# Patient Record
Sex: Female | Born: 1950 | Race: White | Hispanic: No | Marital: Married | Smoking: Never smoker
Health system: Southern US, Community
[De-identification: ages and names within clinical notes are randomized; demographics above are authoritative.]

## PROBLEM LIST (undated history)

## (undated) DIAGNOSIS — R519 Headache, unspecified: Secondary | ICD-10-CM

## (undated) DIAGNOSIS — Z87898 Personal history of other specified conditions: Secondary | ICD-10-CM

## (undated) DIAGNOSIS — H0016 Chalazion left eye, unspecified eyelid: Secondary | ICD-10-CM

## (undated) DIAGNOSIS — N390 Urinary tract infection, site not specified: Secondary | ICD-10-CM

## (undated) DIAGNOSIS — E041 Nontoxic single thyroid nodule: Secondary | ICD-10-CM

## (undated) DIAGNOSIS — K219 Gastro-esophageal reflux disease without esophagitis: Secondary | ICD-10-CM

## (undated) DIAGNOSIS — R002 Palpitations: Secondary | ICD-10-CM

## (undated) DIAGNOSIS — J449 Chronic obstructive pulmonary disease, unspecified: Secondary | ICD-10-CM

## (undated) DIAGNOSIS — D869 Sarcoidosis, unspecified: Secondary | ICD-10-CM

## (undated) DIAGNOSIS — I10 Essential (primary) hypertension: Secondary | ICD-10-CM

## (undated) DIAGNOSIS — R531 Weakness: Secondary | ICD-10-CM

## (undated) DIAGNOSIS — IMO0001 Reserved for inherently not codable concepts without codable children: Secondary | ICD-10-CM

## (undated) DIAGNOSIS — K5790 Diverticulosis of intestine, part unspecified, without perforation or abscess without bleeding: Secondary | ICD-10-CM

## (undated) DIAGNOSIS — J45909 Unspecified asthma, uncomplicated: Secondary | ICD-10-CM

## (undated) DIAGNOSIS — A630 Anogenital (venereal) warts: Secondary | ICD-10-CM

## (undated) DIAGNOSIS — J069 Acute upper respiratory infection, unspecified: Secondary | ICD-10-CM

## (undated) DIAGNOSIS — G8929 Other chronic pain: Secondary | ICD-10-CM

## (undated) DIAGNOSIS — J189 Pneumonia, unspecified organism: Secondary | ICD-10-CM

## (undated) DIAGNOSIS — N393 Stress incontinence (female) (male): Secondary | ICD-10-CM

## (undated) DIAGNOSIS — G4730 Sleep apnea, unspecified: Secondary | ICD-10-CM

## (undated) DIAGNOSIS — Z8719 Personal history of other diseases of the digestive system: Secondary | ICD-10-CM

## (undated) DIAGNOSIS — Z860100 Personal history of colon polyps, unspecified: Secondary | ICD-10-CM

## (undated) DIAGNOSIS — R87619 Unspecified abnormal cytological findings in specimens from cervix uteri: Secondary | ICD-10-CM

## (undated) DIAGNOSIS — M2550 Pain in unspecified joint: Secondary | ICD-10-CM

## (undated) DIAGNOSIS — R51 Headache: Secondary | ICD-10-CM

## (undated) DIAGNOSIS — R7989 Other specified abnormal findings of blood chemistry: Secondary | ICD-10-CM

## (undated) DIAGNOSIS — C801 Malignant (primary) neoplasm, unspecified: Secondary | ICD-10-CM

## (undated) DIAGNOSIS — Z8709 Personal history of other diseases of the respiratory system: Secondary | ICD-10-CM

## (undated) DIAGNOSIS — K589 Irritable bowel syndrome without diarrhea: Secondary | ICD-10-CM

## (undated) DIAGNOSIS — F419 Anxiety disorder, unspecified: Secondary | ICD-10-CM

## (undated) DIAGNOSIS — J302 Other seasonal allergic rhinitis: Secondary | ICD-10-CM

## (undated) DIAGNOSIS — M21379 Foot drop, unspecified foot: Secondary | ICD-10-CM

## (undated) DIAGNOSIS — R112 Nausea with vomiting, unspecified: Secondary | ICD-10-CM

## (undated) DIAGNOSIS — E559 Vitamin D deficiency, unspecified: Secondary | ICD-10-CM

## (undated) DIAGNOSIS — R3915 Urgency of urination: Secondary | ICD-10-CM

## (undated) DIAGNOSIS — Z9889 Other specified postprocedural states: Secondary | ICD-10-CM

## (undated) DIAGNOSIS — M1990 Unspecified osteoarthritis, unspecified site: Secondary | ICD-10-CM

## (undated) DIAGNOSIS — M549 Dorsalgia, unspecified: Secondary | ICD-10-CM

## (undated) DIAGNOSIS — K5900 Constipation, unspecified: Secondary | ICD-10-CM

## (undated) HISTORY — PX: COLONOSCOPY: SHX174

## (undated) HISTORY — DX: Unspecified abnormal cytological findings in specimens from cervix uteri: R87.619

## (undated) HISTORY — PX: EXPLORATORY LAPAROTOMY: SUR591

## (undated) HISTORY — PX: EYE SURGERY: SHX253

## (undated) HISTORY — DX: Anogenital (venereal) warts: A63.0

## (undated) HISTORY — PX: ABDOMINAL HYSTERECTOMY: SHX81

## (undated) HISTORY — PX: ESOPHAGOGASTRODUODENOSCOPY: SHX1529

## (undated) HISTORY — PX: CERVICAL FUSION: SHX112

## (undated) HISTORY — PX: CARPAL TUNNEL RELEASE: SHX101

## (undated) HISTORY — DX: Gastro-esophageal reflux disease without esophagitis: K21.9

## (undated) HISTORY — PX: OTHER SURGICAL HISTORY: SHX169

## (undated) HISTORY — PX: BUNIONECTOMY: SHX129

## (undated) HISTORY — PX: TONSILLECTOMY: SUR1361

## (undated) HISTORY — PX: BACK SURGERY: SHX140

---

## 1994-09-05 HISTORY — PX: BREAST BIOPSY: SHX20

## 2005-03-21 ENCOUNTER — Ambulatory Visit: Payer: Self-pay | Admitting: Internal Medicine

## 2005-08-31 ENCOUNTER — Other Ambulatory Visit: Payer: Self-pay

## 2005-09-02 ENCOUNTER — Ambulatory Visit: Payer: Self-pay | Admitting: Podiatry

## 2006-01-13 ENCOUNTER — Ambulatory Visit: Payer: Self-pay | Admitting: Unknown Physician Specialty

## 2006-03-23 ENCOUNTER — Ambulatory Visit: Payer: Self-pay | Admitting: Internal Medicine

## 2006-03-27 ENCOUNTER — Ambulatory Visit: Payer: Self-pay | Admitting: Unknown Physician Specialty

## 2006-07-26 ENCOUNTER — Ambulatory Visit: Payer: Self-pay | Admitting: Internal Medicine

## 2006-09-01 ENCOUNTER — Ambulatory Visit: Payer: Self-pay | Admitting: Podiatry

## 2006-09-08 ENCOUNTER — Ambulatory Visit: Payer: Self-pay | Admitting: Podiatry

## 2007-02-09 ENCOUNTER — Ambulatory Visit: Payer: Self-pay | Admitting: Internal Medicine

## 2007-03-16 ENCOUNTER — Ambulatory Visit: Payer: Self-pay | Admitting: Internal Medicine

## 2007-03-26 ENCOUNTER — Ambulatory Visit: Payer: Self-pay | Admitting: Internal Medicine

## 2007-04-02 ENCOUNTER — Ambulatory Visit: Payer: Self-pay | Admitting: Urology

## 2007-10-11 ENCOUNTER — Ambulatory Visit: Payer: Self-pay | Admitting: Internal Medicine

## 2008-03-27 ENCOUNTER — Ambulatory Visit: Payer: Self-pay | Admitting: Internal Medicine

## 2009-03-17 ENCOUNTER — Ambulatory Visit: Payer: Self-pay | Admitting: Unknown Physician Specialty

## 2009-03-27 ENCOUNTER — Ambulatory Visit: Payer: Self-pay | Admitting: Unknown Physician Specialty

## 2009-03-30 ENCOUNTER — Ambulatory Visit: Payer: Self-pay | Admitting: Internal Medicine

## 2009-08-19 ENCOUNTER — Ambulatory Visit: Payer: Self-pay | Admitting: Internal Medicine

## 2010-03-31 ENCOUNTER — Ambulatory Visit: Payer: Self-pay | Admitting: Internal Medicine

## 2010-08-20 ENCOUNTER — Encounter
Admission: RE | Admit: 2010-08-20 | Discharge: 2010-08-20 | Payer: Self-pay | Source: Home / Self Care | Attending: Orthopedic Surgery | Admitting: Orthopedic Surgery

## 2011-04-27 ENCOUNTER — Ambulatory Visit: Payer: Self-pay | Admitting: Internal Medicine

## 2011-09-02 ENCOUNTER — Ambulatory Visit: Payer: Self-pay | Admitting: Unknown Physician Specialty

## 2012-05-01 ENCOUNTER — Ambulatory Visit: Payer: Self-pay | Admitting: Internal Medicine

## 2012-11-15 ENCOUNTER — Ambulatory Visit: Payer: Self-pay | Admitting: Orthopedic Surgery

## 2013-02-15 DIAGNOSIS — J45909 Unspecified asthma, uncomplicated: Secondary | ICD-10-CM | POA: Insufficient documentation

## 2013-05-02 ENCOUNTER — Ambulatory Visit: Payer: Self-pay | Admitting: Internal Medicine

## 2014-03-10 ENCOUNTER — Ambulatory Visit: Payer: Self-pay | Admitting: Orthopedic Surgery

## 2014-03-10 LAB — CBC WITH DIFFERENTIAL/PLATELET
BASOS PCT: 0.8 %
Basophil #: 0.1 10*3/uL (ref 0.0–0.1)
Eosinophil #: 0.4 10*3/uL (ref 0.0–0.7)
Eosinophil %: 5.7 %
HCT: 40.1 % (ref 35.0–47.0)
HGB: 13.7 g/dL (ref 12.0–16.0)
LYMPHS ABS: 1.6 10*3/uL (ref 1.0–3.6)
Lymphocyte %: 22.2 %
MCH: 31.1 pg (ref 26.0–34.0)
MCHC: 34.3 g/dL (ref 32.0–36.0)
MCV: 91 fL (ref 80–100)
Monocyte #: 0.5 x10 3/mm (ref 0.2–0.9)
Monocyte %: 6.4 %
Neutrophil #: 4.7 10*3/uL (ref 1.4–6.5)
Neutrophil %: 64.9 %
Platelet: 204 10*3/uL (ref 150–440)
RBC: 4.42 10*6/uL (ref 3.80–5.20)
RDW: 13.2 % (ref 11.5–14.5)
WBC: 7.3 10*3/uL (ref 3.6–11.0)

## 2014-03-10 LAB — POTASSIUM: POTASSIUM: 3.9 mmol/L (ref 3.5–5.1)

## 2014-03-13 DIAGNOSIS — I10 Essential (primary) hypertension: Secondary | ICD-10-CM | POA: Insufficient documentation

## 2014-03-13 DIAGNOSIS — E785 Hyperlipidemia, unspecified: Secondary | ICD-10-CM | POA: Insufficient documentation

## 2014-03-13 DIAGNOSIS — R002 Palpitations: Secondary | ICD-10-CM | POA: Insufficient documentation

## 2014-03-13 DIAGNOSIS — D869 Sarcoidosis, unspecified: Secondary | ICD-10-CM | POA: Insufficient documentation

## 2014-03-18 ENCOUNTER — Ambulatory Visit: Payer: Self-pay | Admitting: Orthopedic Surgery

## 2014-06-09 ENCOUNTER — Ambulatory Visit: Payer: Self-pay | Admitting: Internal Medicine

## 2014-06-19 ENCOUNTER — Ambulatory Visit: Payer: Self-pay | Admitting: Internal Medicine

## 2014-09-02 ENCOUNTER — Ambulatory Visit: Payer: Self-pay | Admitting: Internal Medicine

## 2014-10-08 ENCOUNTER — Other Ambulatory Visit (HOSPITAL_COMMUNITY): Payer: Self-pay | Admitting: Neurosurgery

## 2014-10-17 ENCOUNTER — Encounter (HOSPITAL_COMMUNITY): Payer: Self-pay

## 2014-10-17 ENCOUNTER — Encounter (HOSPITAL_COMMUNITY)
Admission: RE | Admit: 2014-10-17 | Discharge: 2014-10-17 | Disposition: A | Discharge status: Home / Self Care | Payer: Medicare Other | Source: Ambulatory Visit | Attending: Neurosurgery | Admitting: Neurosurgery

## 2014-10-17 DIAGNOSIS — J45909 Unspecified asthma, uncomplicated: Secondary | ICD-10-CM | POA: Diagnosis not present

## 2014-10-17 DIAGNOSIS — D869 Sarcoidosis, unspecified: Secondary | ICD-10-CM | POA: Diagnosis not present

## 2014-10-17 DIAGNOSIS — I10 Essential (primary) hypertension: Secondary | ICD-10-CM | POA: Diagnosis not present

## 2014-10-17 DIAGNOSIS — M4310 Spondylolisthesis, site unspecified: Secondary | ICD-10-CM | POA: Insufficient documentation

## 2014-10-17 DIAGNOSIS — M4806 Spinal stenosis, lumbar region: Secondary | ICD-10-CM | POA: Diagnosis not present

## 2014-10-17 DIAGNOSIS — K219 Gastro-esophageal reflux disease without esophagitis: Secondary | ICD-10-CM | POA: Insufficient documentation

## 2014-10-17 DIAGNOSIS — Z01812 Encounter for preprocedural laboratory examination: Secondary | ICD-10-CM | POA: Insufficient documentation

## 2014-10-17 HISTORY — DX: Pneumonia, unspecified organism: J18.9

## 2014-10-17 HISTORY — DX: Diverticulosis of intestine, part unspecified, without perforation or abscess without bleeding: K57.90

## 2014-10-17 HISTORY — DX: Personal history of other diseases of the respiratory system: Z87.09

## 2014-10-17 HISTORY — DX: Urinary tract infection, site not specified: N39.0

## 2014-10-17 HISTORY — DX: Personal history of colon polyps, unspecified: Z86.0100

## 2014-10-17 HISTORY — DX: Other seasonal allergic rhinitis: J30.2

## 2014-10-17 HISTORY — DX: Weakness: R53.1

## 2014-10-17 HISTORY — DX: Sarcoidosis, unspecified: D86.9

## 2014-10-17 HISTORY — DX: Acute upper respiratory infection, unspecified: J06.9

## 2014-10-17 HISTORY — DX: Essential (primary) hypertension: I10

## 2014-10-17 HISTORY — DX: Vitamin D deficiency, unspecified: E55.9

## 2014-10-17 HISTORY — DX: Urgency of urination: R39.15

## 2014-10-17 HISTORY — DX: Personal history of colonic polyps: Z86.010

## 2014-10-17 HISTORY — DX: Foot drop, unspecified foot: M21.379

## 2014-10-17 HISTORY — DX: Palpitations: R00.2

## 2014-10-17 HISTORY — DX: Stress incontinence (female) (male): N39.3

## 2014-10-17 HISTORY — DX: Nausea with vomiting, unspecified: R11.2

## 2014-10-17 HISTORY — DX: Unspecified asthma, uncomplicated: J45.909

## 2014-10-17 HISTORY — DX: Constipation, unspecified: K59.00

## 2014-10-17 HISTORY — DX: Other chronic pain: G89.29

## 2014-10-17 HISTORY — DX: Other specified postprocedural states: Z98.890

## 2014-10-17 HISTORY — DX: Unspecified osteoarthritis, unspecified site: M19.90

## 2014-10-17 HISTORY — DX: Personal history of other diseases of the digestive system: Z87.19

## 2014-10-17 HISTORY — DX: Pain in unspecified joint: M25.50

## 2014-10-17 HISTORY — DX: Gastro-esophageal reflux disease without esophagitis: K21.9

## 2014-10-17 HISTORY — DX: Dorsalgia, unspecified: M54.9

## 2014-10-17 LAB — CBC
HCT: 40.2 % (ref 36.0–46.0)
Hemoglobin: 13.4 g/dL (ref 12.0–15.0)
MCH: 30.2 pg (ref 26.0–34.0)
MCHC: 33.3 g/dL (ref 30.0–36.0)
MCV: 90.7 fL (ref 78.0–100.0)
Platelets: 206 10*3/uL (ref 150–400)
RBC: 4.43 MIL/uL (ref 3.87–5.11)
RDW: 13.6 % (ref 11.5–15.5)
WBC: 5.5 10*3/uL (ref 4.0–10.5)

## 2014-10-17 LAB — SURGICAL PCR SCREEN
MRSA, PCR: POSITIVE — AB
Staphylococcus aureus: POSITIVE — AB

## 2014-10-17 LAB — BASIC METABOLIC PANEL
ANION GAP: 11 (ref 5–15)
BUN: 13 mg/dL (ref 6–23)
CHLORIDE: 104 mmol/L (ref 96–112)
CO2: 26 mmol/L (ref 19–32)
Calcium: 9.5 mg/dL (ref 8.4–10.5)
Creatinine, Ser: 1.37 mg/dL — ABNORMAL HIGH (ref 0.50–1.10)
GFR calc Af Amer: 46 mL/min — ABNORMAL LOW (ref >90)
GFR calc non Af Amer: 40 mL/min — ABNORMAL LOW (ref >90)
Glucose, Bld: 96 mg/dL (ref 70–99)
POTASSIUM: 3.9 mmol/L (ref 3.5–5.1)
SODIUM: 141 mmol/L (ref 135–145)

## 2014-10-17 NOTE — Pre-Procedure Instructions (Signed)
Megan Daniels  10/17/2014   Your procedure is scheduled on:  Wed, Feb 24 @ 12:40 PM  Report to [facility]  at 9:40 AM.  Call this number if you have problems the morning of surgery: [phone]   Remember:   Do not eat food or drink liquids after midnight.   Take these medicines the morning of surgery with A SIP OF WATER: Prevacid(Lansoprazole),Claritin(Loratadine),and Metoprolol(Toprol)              Stop taking your Aspirin,Vitamins,or any Herbal Medications a week before surgery. No Goody's,BC's,Aleve,Ibuprofen,or any Fish Oil.    Do not wear jewelry  Do not wear lotions, powders, or colognes. You may wear deodorant.  Do not shave 48 hours prior to surgery.  Do not bring valuables to the hospital.  [facility] is not responsible                  for any belongings or valuables.               Contacts, dentures or bridgework may not be worn into surgery.  Leave suitcase in the car. After surgery it may be brought to your room.  For patients admitted to the hospital, discharge time is determined by your                treatment team.                   Special Instructions:  [facility] - Preparing for Surgery  Before surgery, you can play an important role.  Because skin is not sterile, your skin needs to be as free of germs as possible.  You can reduce the number of germs on you skin by washing with CHG (chlorahexidine gluconate) soap before surgery.  CHG is an antiseptic cleaner which kills germs and bonds with the skin to continue killing germs even after washing.  Please DO NOT use if you have an allergy to CHG or antibacterial soaps.  If your skin becomes reddened/irritated stop using the CHG and inform your nurse when you arrive at Short Stay.  Do not shave (including legs and underarms) for at least 48 hours prior to the first CHG shower.  You may shave your face.  Please follow these instructions carefully:   1.  Shower with CHG Soap the night before surgery  and the                                morning of Surgery.  2.  If you choose to wash your hair, wash your hair first as usual with your       normal shampoo.  3.  After you shampoo, rinse your hair and body thoroughly to remove the                      Shampoo.  4.  Use CHG as you would any other liquid soap.  You can apply chg directly       to the skin and wash gently with scrungie or a clean washcloth.  5.  Apply the CHG Soap to your body ONLY FROM THE NECK DOWN.        Do not use on open wounds or open sores.  Avoid contact with your eyes,       ears, mouth and genitals (private parts).  Wash genitals (private parts)  with your normal soap.  6.  Wash thoroughly, paying special attention to the area where your surgery        will be performed.  7.  Thoroughly rinse your body with warm water from the neck down.  8.  DO NOT shower/wash with your normal soap after using and rinsing off       the CHG Soap.  9.  Pat yourself dry with a clean towel.            10.  Wear clean pajamas.            11.  Place clean sheets on your bed the night of your first shower and do not        sleep with pets.  Day of Surgery  Do not apply any lotions/deoderants the morning of surgery.  Please wear clean clothes to the hospital/surgery center.    Please read over the following fact sheets that you were given: Pain Booklet, Coughing and Deep Breathing, Blood Transfusion Information, MRSA Information and Surgical Site Infection Prevention

## 2014-10-17 NOTE — Progress Notes (Addendum)
Pt doesn't have a cardiologist  Stress test done with [facility] in [place] is [facility]  Echo done in 2016-to request  Denies ever having a heart cath  EKG to be requested from Dr.Sparks  CXR to be requested also

## 2014-10-20 NOTE — Progress Notes (Addendum)
Anesthesia Chart Review:  Patient is a 64 year old female scheduled for L4-5 laminectomy with PLIF on 10/29/14 by Dr. Arnoldo Morale.  History includes non-smoker, palpitations, HTN, Sarcoidosis, asthma, foot drop, GERD, hiatal hernia, Nissan fundoplication, diverticulosis, back and neck surgeries, "EXTREME" POST-OP N/V.  BMI is consistent with obesity.  PCP is Dr. Fulton Reek with [facility] (see Care Everywhere).  PRN pulmonology follow-up recommended by Dr. York Spaniel according to 02/15/13 note in Care Everywhere.   07/02/14 Echo (Care Everywhere): NORMAL LEFT VENTRICULAR SYSTOLIC FUNCTION WITH AN ESTIMATED EF = 55 % NORMAL RIGHT VENTRICULAR SYSTOLIC FUNCTION MILD TRICUSPID AND MITRAL VALVE INSUFFICIENCY NO VALVULAR STENOSIS  According to Dr. Doy Hutching notes from 03/13/14, patient has a history of an EKG with T wave abnormality, but had a "normal" Myoview stress test less than one year ago.  Her last EKG and stress test have been requested but are still pending.  02/15/13 Spirometry (Care Everywhere): Interpretation: Normal. FVC 2.67 (86%). FEV1 2.31 (97%).   Preoperative labs noted.   Additional follow-up once cardiology testing received.  [facility] Short Stay Center/Anesthesiology Phone [phone] 10/20/2014 2:35 PM  Addendum: Reviewed records received from [facility].  - 03/10/14 EKG tracing not received as of yet. (Re-requested)  - 03/21/13 Nuclear stress test [facility]): Normal treadmill ECG without evidence of ischemia or dysrhythmia. Excellent exercise tolerance for age. Normal myocardial perfusion images at rest and peak stress without evidence of ischemia. Normal LV wall motion and LVEF 55%.  - 11/14/13 CXR: Stable enlargement of the cardiopericardial silhouette. Atherosclerotic calcifications noted in the transverse aorta.  Extensive small mediastinal and bi hilar calcified lymph nodes. Reportedly patient has a history of Sarcoidosis. Small nodular opacities  in the RML which are also stable. No new focal airspace opacity, pleural effusion or pneumothorax. No acute osseous abnormality.   The EKG received was from 03/20/13.  If her 03/10/14 EKG from Dr. Doy Hutching office is not received then she will need one repeated on arrival.  She had a normal stress test < 2 years ago and an unremarkable echo within the past six months.  Based on currently available information, I am anticipating that she can proceed if no acute changes.  George Hugh [facility] Short Stay Center/Anesthesiology Phone [phone] 10/21/2014 2:06 PM  Addendum: EKG from 03/13/14 received and showed NSR, possible LAE.  George Hugh [facility] Short Stay Center/Anesthesiology Phone [phone] 10/22/2014 5:27 PM

## 2014-10-22 NOTE — Progress Notes (Signed)
Spoke with Janett Billow at [facility] requested last EKG tracing, stress test, and sleep study.

## 2014-10-28 MED ORDER — CEFAZOLIN SODIUM-DEXTROSE 2-3 GM-% IV SOLR
2.0000 g | INTRAVENOUS | Status: AC
  Administered 2014-10-29: 2 g via INTRAVENOUS
  Filled 2014-10-28: qty 50

## 2014-10-29 ENCOUNTER — Encounter (HOSPITAL_COMMUNITY): Payer: Self-pay | Admitting: Surgery

## 2014-10-29 ENCOUNTER — Encounter (HOSPITAL_COMMUNITY)
Admission: RE | Disposition: A | Discharge status: Home Health | Payer: Medicare Other | Source: Ambulatory Visit | Attending: Neurosurgery

## 2014-10-29 ENCOUNTER — Inpatient Hospital Stay (HOSPITAL_COMMUNITY): Payer: Medicare Other

## 2014-10-29 ENCOUNTER — Inpatient Hospital Stay (HOSPITAL_COMMUNITY)
Admission: RE | Admit: 2014-10-29 | Discharge: 2014-11-01 | DRG: 459 | Disposition: A | Discharge status: Home Health | Payer: Medicare Other | Source: Ambulatory Visit | Attending: Neurosurgery | Admitting: Neurosurgery

## 2014-10-29 ENCOUNTER — Inpatient Hospital Stay (HOSPITAL_COMMUNITY): Payer: Medicare Other | Admitting: Vascular Surgery

## 2014-10-29 ENCOUNTER — Inpatient Hospital Stay (HOSPITAL_COMMUNITY): Payer: Medicare Other | Admitting: Certified Registered Nurse Anesthetist

## 2014-10-29 DIAGNOSIS — Z79899 Other long term (current) drug therapy: Secondary | ICD-10-CM

## 2014-10-29 DIAGNOSIS — M4806 Spinal stenosis, lumbar region: Secondary | ICD-10-CM | POA: Diagnosis present

## 2014-10-29 DIAGNOSIS — K219 Gastro-esophageal reflux disease without esophagitis: Secondary | ICD-10-CM | POA: Diagnosis present

## 2014-10-29 DIAGNOSIS — J45909 Unspecified asthma, uncomplicated: Secondary | ICD-10-CM | POA: Diagnosis present

## 2014-10-29 DIAGNOSIS — I10 Essential (primary) hypertension: Secondary | ICD-10-CM | POA: Diagnosis present

## 2014-10-29 DIAGNOSIS — G8918 Other acute postprocedural pain: Secondary | ICD-10-CM | POA: Diagnosis not present

## 2014-10-29 DIAGNOSIS — R509 Fever, unspecified: Secondary | ICD-10-CM

## 2014-10-29 DIAGNOSIS — M4316 Spondylolisthesis, lumbar region: Principal | ICD-10-CM | POA: Diagnosis present

## 2014-10-29 DIAGNOSIS — Z419 Encounter for procedure for purposes other than remedying health state, unspecified: Secondary | ICD-10-CM

## 2014-10-29 DIAGNOSIS — M545 Low back pain: Secondary | ICD-10-CM | POA: Diagnosis present

## 2014-10-29 DIAGNOSIS — G9519 Other vascular myelopathies: Secondary | ICD-10-CM | POA: Diagnosis present

## 2014-10-29 DIAGNOSIS — Z7982 Long term (current) use of aspirin: Secondary | ICD-10-CM

## 2014-10-29 HISTORY — DX: Chalazion left eye, unspecified eyelid: H00.16

## 2014-10-29 LAB — TYPE AND SCREEN
ABO/RH(D): O POS
Antibody Screen: NEGATIVE

## 2014-10-29 LAB — ABO/RH: ABO/RH(D): O POS

## 2014-10-29 SURGERY — POSTERIOR LUMBAR FUSION 1 LEVEL
Anesthesia: General | Site: Spine Lumbar

## 2014-10-29 MED ORDER — FENTANYL CITRATE 0.05 MG/ML IJ SOLN
INTRAMUSCULAR | Status: AC
  Filled 2014-10-29: qty 5

## 2014-10-29 MED ORDER — DEXTROSE 5 % IV SOLN
10.0000 mg | INTRAVENOUS | Status: DC | PRN
  Administered 2014-10-29: 10 ug/min via INTRAVENOUS

## 2014-10-29 MED ORDER — DIPHENHYDRAMINE HCL 50 MG/ML IJ SOLN
INTRAMUSCULAR | Status: AC
  Filled 2014-10-29: qty 1

## 2014-10-29 MED ORDER — VANCOMYCIN HCL IN DEXTROSE 1-5 GM/200ML-% IV SOLN
INTRAVENOUS | Status: AC
  Administered 2014-10-29: 1000 mg via INTRAVENOUS
  Filled 2014-10-29: qty 200

## 2014-10-29 MED ORDER — LOSARTAN POTASSIUM 50 MG PO TABS
100.0000 mg | ORAL_TABLET | Freq: Every day | ORAL | Status: DC
  Administered 2014-10-30: 100 mg via ORAL
  Filled 2014-10-29 (×3): qty 2

## 2014-10-29 MED ORDER — OXYCODONE HCL 5 MG PO TABS: 5.0000 mg | ORAL_TABLET | Freq: Once | ORAL | Status: DC | PRN

## 2014-10-29 MED ORDER — ACETAMINOPHEN 325 MG PO TABS
650.0000 mg | ORAL_TABLET | ORAL | Status: DC | PRN
  Administered 2014-10-31 – 2014-11-01 (×3): 650 mg via ORAL
  Filled 2014-10-29 (×3): qty 2

## 2014-10-29 MED ORDER — HYDROMORPHONE HCL 1 MG/ML IJ SOLN
INTRAMUSCULAR | Status: AC
  Filled 2014-10-29: qty 1

## 2014-10-29 MED ORDER — LACTATED RINGERS IV SOLN
INTRAVENOUS | Status: DC | PRN
  Administered 2014-10-29 (×3): via INTRAVENOUS

## 2014-10-29 MED ORDER — SURGIFOAM 100 EX MISC
CUTANEOUS | Status: DC | PRN
  Administered 2014-10-29: 20 mL via TOPICAL

## 2014-10-29 MED ORDER — PROPOFOL 10 MG/ML IV BOLUS
INTRAVENOUS | Status: DC | PRN
  Administered 2014-10-29: 130 mg via INTRAVENOUS

## 2014-10-29 MED ORDER — DIAZEPAM 5 MG PO TABS
5.0000 mg | ORAL_TABLET | Freq: Four times a day (QID) | ORAL | Status: DC | PRN
Start: 2014-10-29 — End: 2014-11-01
  Administered 2014-10-29 – 2014-11-01 (×6): 5 mg via ORAL
  Filled 2014-10-29 (×6): qty 1

## 2014-10-29 MED ORDER — ROCURONIUM BROMIDE 100 MG/10ML IV SOLN
INTRAVENOUS | Status: DC | PRN
  Administered 2014-10-29: 50 mg via INTRAVENOUS

## 2014-10-29 MED ORDER — NEOSTIGMINE METHYLSULFATE 10 MG/10ML IV SOLN
INTRAVENOUS | Status: AC
  Filled 2014-10-29: qty 1

## 2014-10-29 MED ORDER — ARTIFICIAL TEARS OP OINT
TOPICAL_OINTMENT | OPHTHALMIC | Status: AC
  Filled 2014-10-29: qty 3.5

## 2014-10-29 MED ORDER — MUPIROCIN 2 % EX OINT
1.0000 "application " | TOPICAL_OINTMENT | Freq: Two times a day (BID) | CUTANEOUS | Status: DC
  Administered 2014-10-29 – 2014-10-31 (×5): 1 via NASAL

## 2014-10-29 MED ORDER — SCOPOLAMINE 1 MG/3DAYS TD PT72
1.0000 | MEDICATED_PATCH | TRANSDERMAL | Status: DC
  Administered 2014-10-29: 1.5 mg via TRANSDERMAL
  Filled 2014-10-29 (×2): qty 1

## 2014-10-29 MED ORDER — ONDANSETRON HCL 4 MG/2ML IJ SOLN
INTRAMUSCULAR | Status: AC
  Filled 2014-10-29: qty 2

## 2014-10-29 MED ORDER — BUPIVACAINE LIPOSOME 1.3 % IJ SUSP
INTRAMUSCULAR | Status: DC | PRN
  Administered 2014-10-29: 20 mL

## 2014-10-29 MED ORDER — DOCUSATE SODIUM 100 MG PO CAPS
100.0000 mg | ORAL_CAPSULE | Freq: Two times a day (BID) | ORAL | Status: DC
  Administered 2014-10-29 – 2014-10-31 (×4): 100 mg via ORAL
  Filled 2014-10-29 (×4): qty 1

## 2014-10-29 MED ORDER — ACETAMINOPHEN 325 MG PO TABS: 325.0000 mg | ORAL_TABLET | ORAL | Status: DC | PRN

## 2014-10-29 MED ORDER — PHENOL 1.4 % MT LIQD: 1.0000 | OROMUCOSAL | Status: DC | PRN

## 2014-10-29 MED ORDER — VECURONIUM BROMIDE 10 MG IV SOLR
INTRAVENOUS | Status: AC
  Filled 2014-10-29: qty 10

## 2014-10-29 MED ORDER — BUPIVACAINE-EPINEPHRINE (PF) 0.5% -1:200000 IJ SOLN
INTRAMUSCULAR | Status: DC | PRN
  Administered 2014-10-29: 10 mL

## 2014-10-29 MED ORDER — MENTHOL 3 MG MT LOZG
1.0000 | LOZENGE | OROMUCOSAL | Status: DC | PRN
Start: 2014-10-29 — End: 2014-11-01

## 2014-10-29 MED ORDER — DEXAMETHASONE SODIUM PHOSPHATE 4 MG/ML IJ SOLN
INTRAMUSCULAR | Status: DC | PRN
  Administered 2014-10-29: 4 mg via INTRAVENOUS

## 2014-10-29 MED ORDER — PANTOPRAZOLE SODIUM 40 MG PO TBEC
40.0000 mg | DELAYED_RELEASE_TABLET | Freq: Every day | ORAL | Status: DC
  Administered 2014-10-30 – 2014-10-31 (×2): 40 mg via ORAL
  Filled 2014-10-29 (×2): qty 1

## 2014-10-29 MED ORDER — ROCURONIUM BROMIDE 50 MG/5ML IV SOLN
INTRAVENOUS | Status: AC
  Filled 2014-10-29: qty 1

## 2014-10-29 MED ORDER — NEOSTIGMINE METHYLSULFATE 10 MG/10ML IV SOLN
INTRAVENOUS | Status: DC | PRN
  Administered 2014-10-29: 4 mg via INTRAVENOUS

## 2014-10-29 MED ORDER — SCOPOLAMINE 1 MG/3DAYS TD PT72
MEDICATED_PATCH | TRANSDERMAL | Status: AC
  Administered 2014-10-29: 1.5 mg via TRANSDERMAL
  Filled 2014-10-29: qty 1

## 2014-10-29 MED ORDER — LIDOCAINE HCL (CARDIAC) 20 MG/ML IV SOLN
INTRAVENOUS | Status: AC
  Filled 2014-10-29: qty 5

## 2014-10-29 MED ORDER — MORPHINE SULFATE 2 MG/ML IJ SOLN
1.0000 mg | INTRAMUSCULAR | Status: DC | PRN
  Administered 2014-10-29: 2 mg via INTRAVENOUS
  Filled 2014-10-29: qty 1

## 2014-10-29 MED ORDER — ONDANSETRON HCL 4 MG/2ML IJ SOLN: 4.0000 mg | INTRAMUSCULAR | Status: DC | PRN

## 2014-10-29 MED ORDER — MUPIROCIN 2 % EX OINT
1.0000 "application " | TOPICAL_OINTMENT | Freq: Two times a day (BID) | CUTANEOUS | Status: DC
  Administered 2014-10-29: 1 via TOPICAL

## 2014-10-29 MED ORDER — PHENYLEPHRINE HCL 10 MG/ML IJ SOLN
INTRAMUSCULAR | Status: AC
  Filled 2014-10-29: qty 2

## 2014-10-29 MED ORDER — ONDANSETRON HCL 4 MG/2ML IJ SOLN
INTRAMUSCULAR | Status: DC | PRN
  Administered 2014-10-29 (×2): 4 mg via INTRAVENOUS

## 2014-10-29 MED ORDER — SODIUM CHLORIDE 0.9 % IR SOLN
Status: DC | PRN
  Administered 2014-10-29: 500 mL

## 2014-10-29 MED ORDER — DEXAMETHASONE SODIUM PHOSPHATE 4 MG/ML IJ SOLN
INTRAMUSCULAR | Status: AC
  Filled 2014-10-29: qty 1

## 2014-10-29 MED ORDER — MINOCYCLINE HCL 50 MG PO CAPS
50.0000 mg | ORAL_CAPSULE | Freq: Every day | ORAL | Status: DC
  Administered 2014-10-30: 50 mg via ORAL
  Filled 2014-10-29 (×4): qty 1

## 2014-10-29 MED ORDER — MIDAZOLAM HCL 5 MG/5ML IJ SOLN
INTRAMUSCULAR | Status: DC | PRN
  Administered 2014-10-29: 2 mg via INTRAVENOUS

## 2014-10-29 MED ORDER — DIPHENHYDRAMINE HCL 50 MG/ML IJ SOLN
INTRAMUSCULAR | Status: DC | PRN
  Administered 2014-10-29: 12.5 mg via INTRAVENOUS

## 2014-10-29 MED ORDER — PHENYLEPHRINE HCL 10 MG/ML IJ SOLN
INTRAMUSCULAR | Status: DC | PRN
  Administered 2014-10-29: 80 ug via INTRAVENOUS

## 2014-10-29 MED ORDER — BUPIVACAINE LIPOSOME 1.3 % IJ SUSP
20.0000 mL | Freq: Once | INTRAMUSCULAR | Status: DC
  Filled 2014-10-29: qty 20

## 2014-10-29 MED ORDER — MUPIROCIN 2 % EX OINT
TOPICAL_OINTMENT | CUTANEOUS | Status: AC
Start: 2014-10-29 — End: 2014-10-29
  Administered 2014-10-29: 1 via TOPICAL
  Filled 2014-10-29: qty 22

## 2014-10-29 MED ORDER — OXYCODONE-ACETAMINOPHEN 5-325 MG PO TABS
1.0000 | ORAL_TABLET | ORAL | Status: DC | PRN
  Administered 2014-10-29 – 2014-11-01 (×12): 2 via ORAL
  Filled 2014-10-29 (×12): qty 2

## 2014-10-29 MED ORDER — ACETAMINOPHEN 650 MG RE SUPP: 650.0000 mg | RECTAL | Status: DC | PRN

## 2014-10-29 MED ORDER — GLYCOPYRROLATE 0.2 MG/ML IJ SOLN
INTRAMUSCULAR | Status: AC
  Filled 2014-10-29: qty 3

## 2014-10-29 MED ORDER — LACTATED RINGERS IV SOLN: INTRAVENOUS | Status: DC

## 2014-10-29 MED ORDER — OXYCODONE HCL 5 MG/5ML PO SOLN: 5.0000 mg | Freq: Once | ORAL | Status: DC | PRN

## 2014-10-29 MED ORDER — METOPROLOL SUCCINATE ER 25 MG PO TB24
25.0000 mg | ORAL_TABLET | Freq: Every day | ORAL | Status: DC
  Administered 2014-10-30 – 2014-10-31 (×2): 25 mg via ORAL
  Filled 2014-10-29 (×3): qty 1

## 2014-10-29 MED ORDER — PROPOFOL 10 MG/ML IV BOLUS
INTRAVENOUS | Status: AC
  Filled 2014-10-29: qty 20

## 2014-10-29 MED ORDER — HYDROMORPHONE HCL 1 MG/ML IJ SOLN
0.2500 mg | INTRAMUSCULAR | Status: DC | PRN
  Administered 2014-10-29 (×2): 0.5 mg via INTRAVENOUS

## 2014-10-29 MED ORDER — FENTANYL CITRATE 0.05 MG/ML IJ SOLN
INTRAMUSCULAR | Status: DC | PRN
  Administered 2014-10-29: 150 ug via INTRAVENOUS
  Administered 2014-10-29: 100 ug via INTRAVENOUS

## 2014-10-29 MED ORDER — LORATADINE 10 MG PO TABS
10.0000 mg | ORAL_TABLET | Freq: Every day | ORAL | Status: DC
  Administered 2014-10-30 – 2014-10-31 (×2): 10 mg via ORAL
  Filled 2014-10-29 (×3): qty 1

## 2014-10-29 MED ORDER — VECURONIUM BROMIDE 10 MG IV SOLR
INTRAVENOUS | Status: DC | PRN
  Administered 2014-10-29: 2 mg via INTRAVENOUS

## 2014-10-29 MED ORDER — MIDAZOLAM HCL 2 MG/2ML IJ SOLN
INTRAMUSCULAR | Status: AC
  Filled 2014-10-29: qty 2

## 2014-10-29 MED ORDER — 0.9 % SODIUM CHLORIDE (POUR BTL) OPTIME
TOPICAL | Status: DC | PRN
  Administered 2014-10-29: 1000 mL

## 2014-10-29 MED ORDER — SUCCINYLCHOLINE CHLORIDE 20 MG/ML IJ SOLN
INTRAMUSCULAR | Status: DC | PRN
  Administered 2014-10-29: 100 mg via INTRAVENOUS

## 2014-10-29 MED ORDER — LIDOCAINE HCL (CARDIAC) 20 MG/ML IV SOLN
INTRAVENOUS | Status: DC | PRN
  Administered 2014-10-29: 80 mg via INTRAVENOUS

## 2014-10-29 MED ORDER — VANCOMYCIN HCL 1000 MG IV SOLR
750.0000 mg | Freq: Two times a day (BID) | INTRAVENOUS | Status: DC
  Administered 2014-10-30 – 2014-10-31 (×2): 750 mg via INTRAVENOUS
  Filled 2014-10-29 (×3): qty 750

## 2014-10-29 MED ORDER — LACTATED RINGERS IV SOLN
INTRAVENOUS | Status: DC
  Administered 2014-10-29: 09:00:00 via INTRAVENOUS

## 2014-10-29 MED ORDER — ARTIFICIAL TEARS OP OINT
TOPICAL_OINTMENT | OPHTHALMIC | Status: DC | PRN
  Administered 2014-10-29: 1 via OPHTHALMIC

## 2014-10-29 MED ORDER — BACITRACIN ZINC 500 UNIT/GM EX OINT
TOPICAL_OINTMENT | CUTANEOUS | Status: DC | PRN
  Administered 2014-10-29: 1 via TOPICAL

## 2014-10-29 MED ORDER — ACETAMINOPHEN 160 MG/5ML PO SOLN
325.0000 mg | ORAL | Status: DC | PRN
  Filled 2014-10-29: qty 20.3

## 2014-10-29 MED ORDER — GLYCOPYRROLATE 0.2 MG/ML IJ SOLN
INTRAMUSCULAR | Status: DC | PRN
Start: 2014-10-29 — End: 2014-10-29
  Administered 2014-10-29: 0.6 mg via INTRAVENOUS

## 2014-10-29 MED ORDER — ZOLPIDEM TARTRATE 5 MG PO TABS: 5.0000 mg | ORAL_TABLET | Freq: Every evening | ORAL | Status: DC | PRN

## 2014-10-29 MED ORDER — ALUM & MAG HYDROXIDE-SIMETH 200-200-20 MG/5ML PO SUSP: 30.0000 mL | Freq: Four times a day (QID) | ORAL | Status: DC | PRN

## 2014-10-29 MED ORDER — CHLORHEXIDINE GLUCONATE CLOTH 2 % EX PADS
6.0000 | MEDICATED_PAD | Freq: Every day | CUTANEOUS | Status: DC
  Administered 2014-10-30 – 2014-11-01 (×3): 6 via TOPICAL

## 2014-10-29 SURGICAL SUPPLY — 71 items
BAG DECANTER FOR FLEXI CONT (MISCELLANEOUS) ×3 IMPLANT
BENZOIN TINCTURE PRP APPL 2/3 (GAUZE/BANDAGES/DRESSINGS) ×3 IMPLANT
BLADE CLIPPER SURG (BLADE) IMPLANT
BRUSH SCRUB EZ PLAIN DRY (MISCELLANEOUS) ×3 IMPLANT
BUR MATCHSTICK NEURO 3.0 LAGG (BURR) ×3 IMPLANT
BUR PRECISION FLUTE 6.0 (BURR) ×3 IMPLANT
CANISTER SUCT 3000ML PPV (MISCELLANEOUS) ×3 IMPLANT
CAP REVERE LOCKING (Cap) ×12 IMPLANT
CLOSURE WOUND 1/2 X4 (GAUZE/BANDAGES/DRESSINGS) ×1
CONT SPEC 4OZ CLIKSEAL STRL BL (MISCELLANEOUS) ×3 IMPLANT
COVER BACK TABLE 60X90IN (DRAPES) ×3 IMPLANT
DRAPE C-ARM 42X72 X-RAY (DRAPES) ×6 IMPLANT
DRAPE LAPAROTOMY 100X72X124 (DRAPES) ×3 IMPLANT
DRAPE POUCH INSTRU U-SHP 10X18 (DRAPES) ×3 IMPLANT
DRAPE PROXIMA HALF (DRAPES) ×3 IMPLANT
DRAPE SURG 17X23 STRL (DRAPES) ×12 IMPLANT
ELECT BLADE 4.0 EZ CLEAN MEGAD (MISCELLANEOUS) ×3
ELECT REM PT RETURN 9FT ADLT (ELECTROSURGICAL) ×3
ELECTRODE BLDE 4.0 EZ CLN MEGD (MISCELLANEOUS) ×1 IMPLANT
ELECTRODE REM PT RTRN 9FT ADLT (ELECTROSURGICAL) ×1 IMPLANT
EVACUATOR 1/8 PVC DRAIN (DRAIN) ×3 IMPLANT
GAUZE SPONGE 4X4 12PLY STRL (GAUZE/BANDAGES/DRESSINGS) ×3 IMPLANT
GAUZE SPONGE 4X4 16PLY XRAY LF (GAUZE/BANDAGES/DRESSINGS) IMPLANT
GLOVE BIO SURGEON STRL SZ 6.5 (GLOVE) ×4 IMPLANT
GLOVE BIO SURGEON STRL SZ7 (GLOVE) ×3 IMPLANT
GLOVE BIO SURGEON STRL SZ8 (GLOVE) ×3 IMPLANT
GLOVE BIO SURGEON STRL SZ8.5 (GLOVE) ×6 IMPLANT
GLOVE BIO SURGEONS STRL SZ 6.5 (GLOVE) ×2
GLOVE BIOGEL PI IND STRL 6.5 (GLOVE) ×1 IMPLANT
GLOVE BIOGEL PI IND STRL 7.5 (GLOVE) ×2 IMPLANT
GLOVE BIOGEL PI INDICATOR 6.5 (GLOVE) ×2
GLOVE BIOGEL PI INDICATOR 7.5 (GLOVE) ×4
GLOVE EXAM NITRILE LRG STRL (GLOVE) IMPLANT
GLOVE EXAM NITRILE MD LF STRL (GLOVE) IMPLANT
GLOVE EXAM NITRILE XL STR (GLOVE) IMPLANT
GLOVE EXAM NITRILE XS STR PU (GLOVE) IMPLANT
GLOVE SS BIOGEL STRL SZ 8 (GLOVE) ×2 IMPLANT
GLOVE SUPERSENSE BIOGEL SZ 8 (GLOVE) ×4
GLOVE SURG SS PI 7.0 STRL IVOR (GLOVE) ×6 IMPLANT
GOWN STRL REUS W/ TWL LRG LVL3 (GOWN DISPOSABLE) ×1 IMPLANT
GOWN STRL REUS W/ TWL XL LVL3 (GOWN DISPOSABLE) ×4 IMPLANT
GOWN STRL REUS W/TWL 2XL LVL3 (GOWN DISPOSABLE) IMPLANT
GOWN STRL REUS W/TWL LRG LVL3 (GOWN DISPOSABLE) ×2
GOWN STRL REUS W/TWL XL LVL3 (GOWN DISPOSABLE) ×8
KIT BASIN OR (CUSTOM PROCEDURE TRAY) ×3 IMPLANT
KIT ROOM TURNOVER OR (KITS) ×3 IMPLANT
NEEDLE HYPO 21X1.5 SAFETY (NEEDLE) ×3 IMPLANT
NEEDLE HYPO 22GX1.5 SAFETY (NEEDLE) ×3 IMPLANT
NS IRRIG 1000ML POUR BTL (IV SOLUTION) ×3 IMPLANT
PACK LAMINECTOMY NEURO (CUSTOM PROCEDURE TRAY) ×3 IMPLANT
PAD ARMBOARD 7.5X6 YLW CONV (MISCELLANEOUS) ×15 IMPLANT
PATTIES SURGICAL .5 X1 (DISPOSABLE) IMPLANT
ROD REVERE 6.35 40MM (Rod) ×6 IMPLANT
SCREW REVERE 6.35 6.5MMX45 (Screw) ×6 IMPLANT
SCREW REVERE 6.5X50MM (Screw) ×6 IMPLANT
SPACER ALTERA 10X31 10-14-8 (Spacer) ×3 IMPLANT
SPONGE LAP 4X18 X RAY DECT (DISPOSABLE) IMPLANT
SPONGE NEURO XRAY DETECT 1X3 (DISPOSABLE) IMPLANT
SPONGE SURGIFOAM ABS GEL 100 (HEMOSTASIS) ×3 IMPLANT
STRIP BIOACTIVE 20CC 25X100X8 (Miscellaneous) ×3 IMPLANT
STRIP CLOSURE SKIN 1/2X4 (GAUZE/BANDAGES/DRESSINGS) ×2 IMPLANT
SUT VIC AB 1 CT1 18XBRD ANBCTR (SUTURE) ×2 IMPLANT
SUT VIC AB 1 CT1 8-18 (SUTURE) ×4
SUT VIC AB 2-0 CP2 18 (SUTURE) ×6 IMPLANT
SYR 20CC LL (SYRINGE) ×3 IMPLANT
SYR 20ML ECCENTRIC (SYRINGE) ×3 IMPLANT
TAPE CLOTH SURG 4X10 WHT LF (GAUZE/BANDAGES/DRESSINGS) ×3 IMPLANT
TOWEL OR 17X24 6PK STRL BLUE (TOWEL DISPOSABLE) ×3 IMPLANT
TOWEL OR 17X26 10 PK STRL BLUE (TOWEL DISPOSABLE) ×3 IMPLANT
TRAY FOLEY CATH 14FRSI W/METER (CATHETERS) ×3 IMPLANT
WATER STERILE IRR 1000ML POUR (IV SOLUTION) ×3 IMPLANT

## 2014-10-29 NOTE — Anesthesia Procedure Notes (Signed)
Procedure Name: Intubation Date/Time: 10/29/2014 1:29 PM Performed by: Ned Grace Pre-anesthesia Checklist: Patient identified, Timeout performed, Emergency Drugs available, Suction available and Patient being monitored Patient Re-evaluated:Patient Re-evaluated prior to inductionOxygen Delivery Method: Circle system utilized Preoxygenation: Pre-oxygenation with 100% oxygen Intubation Type: IV induction Ventilation: Mask ventilation without difficulty Laryngoscope Size: Mac and 3 Grade View: Grade III Tube type: Oral Tube size: 7.0 mm Number of attempts: 1 Airway Equipment and Method: Stylet Placement Confirmation: ETT inserted through vocal cords under direct vision,  breath sounds checked- equal and bilateral and positive ETCO2 Secured at: 20 cm Tube secured with: Tape Dental Injury: Teeth and Oropharynx as per pre-operative assessment

## 2014-10-29 NOTE — Progress Notes (Signed)
Subjective:  The patient is alert and pleasant.  Objective: Vital signs in last 24 hours: Temp:  [97.1 F (36.2 C)-98.6 F (37 C)] 98.6 F (37 C) (02/24 1640) Pulse Rate:  [67-70] 67 (02/24 1645) Resp:  [14] 14 (02/24 1645) BP: (127-169)/(62-89) 127/62 mmHg (02/24 1645) SpO2:  [98 %-99 %] 99 % (02/24 1645) Weight:  [85.276 kg (188 lb)] 85.276 kg (188 lb) (02/24 0816)  Intake/Output from previous day:   Intake/Output this shift: Total I/O In: 2500 [I.V.:2500] Out: 65 [Urine:410; Blood:200]  Physical exam the patient is alert and pleasant. She is moving her lower extremities well.  Lab Results: No results for input(s): WBC, HGB, HCT, PLT in the last 72 hours. BMET No results for input(s): NA, K, CL, CO2, GLUCOSE, BUN, CREATININE, CALCIUM in the last 72 hours.  Studies/Results: Dg Lumbar Spine 2-3 Views  10/29/2014   CLINICAL DATA:  L4-5 PLIF for spondylolisthesis and lumbar stensois  EXAM: LUMBAR SPINE - 2-3 VIEW  COMPARISON:  None.  FINDINGS: AP and lateral portable images show placement of bilateral pedicle screws at L4 and L5. A metal intervertebral cages well centered maintaining the L4-L5 disc space height. There is no acute fracture or evidence of an operative complication.  IMPRESSION: Portable lumbar spine imaging during posterior lumbar spine fusion at L4 and L5.   Electronically Signed   By: Lajean Manes M.D.   On: 10/29/2014 16:27   Dg Lumbar Spine 1 View  10/29/2014   CLINICAL DATA:  Localization image for L4-5 PLIF for spondylolisthesis and lumbar stensois  EXAM: LUMBAR SPINE - 1 VIEW  COMPARISON:  None.  FINDINGS: Surgical probe with has its tip 2.8 cm posterior to the posterior superior endplate of L5.  IMPRESSION: Surgical localization portable lateral lumbar spine view as described.   Electronically Signed   By: Lajean Manes M.D.   On: 10/29/2014 16:25    Assessment/Plan: The patient is doing well.  LOS: 0 days     Megan Daniels D 10/29/2014, 5:07  PM

## 2014-10-29 NOTE — Progress Notes (Signed)
Nurse called Dr. Arnoldo Morale and informed him that patient was not called about positive PCR results and that Bactroban ointment would be started today. Nurse also informed Dr. Arnoldo Morale that patient has a eye infection on the left eye (Chalazion). Dr. Arnoldo Morale stated it was ok to start ointment and that he would evaluate her when she comes to Neuro holding. Will inform patient of this.

## 2014-10-29 NOTE — Op Note (Signed)
Brief history: The patient is a 64 year old white female who has complained of back and hip and leg pain. She has failed medical management and was worked up with lumbar MRI and a lumbar x-ray. These studies demonstrated the patient had a L4-5 spondylolisthesis with spinal stenosis. I discussed the various treatment options with the patient including surgery. She has weighed the risks, benefits, and alternative to surgery and decided to proceed with an L4-5 decompression, agitation and fusion.  Preoperative diagnosis: L4-5 spondylolisthesis, Degenerative disc disease, spinal stenosis compressing both the L4 and the L5 nerve roots; lumbago; lumbar radiculopathy  Postoperative diagnosis: The same  Procedure: Bilateral L4 Laminotomy/foraminotomies to decompress the bilateral L4 and L5 nerve roots(the work required to do this was in addition to the work required to do the posterior lumbar interbody fusion because of the patient's spinal stenosis, facet arthropathy. Etc. requiring a wide decompression of the nerve roots.); L4-5 posterior lumbar interbody fusion with local morselized autograft bone and Kinnex graft extender; insertion of interbody prosthesis at L4-5 (globus peek expandable interbody prosthesis); posterior nonsegmental instrumentation from L4 to L5 with globus titanium pedicle screws and rods; posterior lateral arthrodesis at L4-5 with local morselized autograft bone and Kinnex bone graft extender.  Surgeon: Dr. Earle Gell  Asst.: Dr. Sherley Bounds  Anesthesia: Gen. endotracheal  Estimated blood loss: 200 mL  Drains: One medium Hemovac  Complications: None  Description of procedure: The patient was brought to the operating room by the anesthesia team. General endotracheal anesthesia was induced. The patient was turned to the prone position on the Wilson frame. The patient's lumbosacral region was then prepared with Betadine scrub and Betadine solution. Sterile drapes were applied.  I  then injected the area to be incised with Marcaine with epinephrine solution. I then used the scalpel to make a linear midline incision over the L4-5 interspace. I then used electrocautery to perform a bilateral subperiosteal dissection exposing the spinous process and lamina of L4 and L5. We then obtained intraoperative radiograph to confirm our location. We then inserted the Verstrac retractor to provide exposure.  I began the decompression by using the high speed drill to perform laminotomies at L4 bilaterally. We then used the Kerrison punches to widen the laminotomy and removed the ligamentum flavum at L4-5 bilaterally and remove the cephalad aspect of the L5 lamina. We used the Kerrison punches to remove the medial facets at L4-5. We performed wide foraminotomies about the bilateral L4 and L5 nerve roots completing the decompression.  We now turned our attention to the posterior lumbar interbody fusion. I used a scalpel to incise the intervertebral disc at L4-5. I then performed a partial intervertebral discectomy at L4-5 using the pituitary forceps. We prepared the vertebral endplates at A2-5 for the fusion by removing the soft tissues with the curettes. We then used the trial spacers to pick the appropriate sized interbody prosthesis. We prefilled his prosthesis with a combination of local morselized autograft bone that we obtained during the decompression as well as Kinnex bone graft extender. We inserted the prefilled prosthesis into the interspace at L4-5. I then expanded the prosthesis.. There was a good snug fit of the prosthesis in the interspace. We then filled and the remainder of the intervertebral disc space with local morselized autograft bone and Kinnex. This completed the posterior lumbar interbody arthrodesis.  We now turned attention to the instrumentation. Under fluoroscopic guidance we cannulated the bilateral L4 and L5 pedicles with the bone probe. We then removed the bone probe.  We  then tapped the pedicle with a 5.5 millimeter tap. We then removed the tap. We probed inside the tapped pedicle with a ball probe to rule out cortical breaches. We then inserted a 6.5 x 45 and 50 millimeter pedicle screw into the L4 and L5 pedicles bilaterally under fluoroscopic guidance. We then palpated along the medial aspect of the pedicles to rule out cortical breaches. There were none. The nerve roots were not injured. We then connected the unilateral pedicle screws with a lordotic rod. We compressed the construct and secured the rod in place with the caps. We then tightened the caps appropriately. This completed the instrumentation from L4-5.  We now turned our attention to the posterior lateral arthrodesis at L4-5. We used the high-speed drill to decorticate the remainder of the facets, pars, transverse process at L4-5. We then applied a combination of local morselized autograft bone and Kinnex bone graft extender over these decorticated posterior lateral structures. This completed the posterior lateral arthrodesis.  We then obtained hemostasis using bipolar electrocautery. We irrigated the wound out with bacitracin solution. We inspected the thecal sac and nerve roots and noted they were well decompressed. We then removed the retractor. We placed a medium Hemovac drain in the epidural space and tunneled out through separate stab wound. We reapproximated patient's thoracolumbar fascia with interrupted #1 Vicryl suture. We reapproximated patient's subcutaneous tissue with interrupted 2-0 Vicryl suture. The reapproximated patient's skin with Steri-Strips and benzoin. The wound was then coated with bacitracin ointment. A sterile dressing was applied. The drapes were removed. The patient was subsequently returned to the supine position where they were extubated by the anesthesia team. He was then transported to the post anesthesia care unit in stable condition. All sponge instrument and needle counts were  reportedly correct at the end of this case.

## 2014-10-29 NOTE — Consult Note (Signed)
PHARMACY CONSULT NOTE--POST-PROCEDURE ANTIBIOTICS   Consult  :  Post Procedure Dosing of Vancomycin Indication :  Empiric Post-Procedure Prophylaxis following spinal surgery.   Pharmacy consulted for post-procedure dosing of Vancomycin s/p spinal surgery.  Patient does have a Hemovac drain in place .  Patient will receive Vancomycin 750 mg IV q 12 hours for empiric post- procedure prophylaxis.   Wt 85 kg,  CrCl 42 ml/min.  PLAN:  1. Begin Vancomycin 750 mg IV q 12 hours, first dose 12 hours after end of surgery.  Length of therapy to be determined by Surgeon. 2. Monitor renal function, WBC, fever curve, any cultures/sensitivities, antibiotic levels as clinically indicated, and clinical progression.  Thank you for allowing Pharmacy to participate in this patient's care.   Inman Fettig, Craig Guess,  Pharm.D.,  10/29/2014,  8:28 PM

## 2014-10-29 NOTE — H&P (Signed)
Subjective: The patient is a 64 year old white female who has complained of back and leg pain consistent with neurogenic claudication. She has failed medical management and was worked up with lumbar x-rays and a lumbar MRI which demonstrated severe spinal stenosis and a spondylolisthesis at L4-5. I discussed the various treatment options with the patient including surgery. She has weighed the risks, benefits, and alternative surgery and decided proceed with a L4-5 decompression, instrumentation, and fusion. The patient has had some swelling in her left eye and recently has seen her eye doctor. She's been put on antibiotics for a blocked tear duct.  Past Medical History  Diagnosis Date  . PONV (postoperative nausea and vomiting)     extreme nausea and vomiting  . Stress incontinence   . Palpitations     takes Metoprolol daily  . Hypertension     takes Losartan daily  . Sarcoidosis   . Asthma     Symbicort as needed  . URI (upper respiratory infection)     treated self with Mucinex and no fever  . Seasonal allergies     takes Claritin daily  . Pneumonia     hx of-early 2000's  . History of bronchitis   . Weakness     numbness and tingling in both legs  . Foot drop     right  . Arthritis   . Joint pain   . Chronic back pain     spondylolisthesis/stenosis  . Vitamin D deficiency   . History of hiatal hernia   . GERD (gastroesophageal reflux disease)     takes Prevacid daily  . Diverticulosis   . History of colon polyps   . Urinary urgency   . Chronic UTI     was treated for 3 months with Macrobid and in Dec everything was fine  . Constipation   . Chalazion of left eye     Past Surgical History  Procedure Laterality Date  . Tonsillectomy    . Abdominal hysterectomy    . Cervical fusion    . Nissan fundoplication    . Bunionectomy Bilateral   . Cataract surgery Bilateral   . Back surgery    . Carpal tunnel release Right     x 2  . Left middle finger surgery      pin   . Colonoscopy    . Esophagogastroduodenoscopy      Allergies  Allergen Reactions  . Talwin [Pentazocine] Nausea And Vomiting  . Vicodin [Hydrocodone-Acetaminophen] Nausea And Vomiting    History  Substance Use Topics  . Smoking status: Never Smoker   . Smokeless tobacco: Not on file  . Alcohol Use: Yes     Comment: wine rarely    History reviewed. No pertinent family history. Prior to Admission medications   Medication Sig Start Date End Date Taking? Authorizing Provider  Ascorbic Acid (VITAMIN C WITH ROSE HIPS) 1000 MG tablet Take 1,000 mg by mouth daily.   Yes Historical Provider, MD  aspirin EC 81 MG tablet Take 81 mg by mouth daily.   Yes Historical Provider, MD  Calcium Carbonate-Vitamin D (CALTRATE 600+D PO) Take 1 tablet by mouth daily.   Yes Historical Provider, MD  cholecalciferol (VITAMIN D) 1000 UNITS tablet Take 2,000 Units by mouth daily.    Yes Historical Provider, MD  docusate sodium (COLACE) 100 MG capsule Take 100 mg by mouth 2 (two) times daily.   Yes Historical Provider, MD  lansoprazole (PREVACID) 30 MG capsule Take 30 mg by mouth  2 (two) times daily before a meal.   Yes Historical Provider, MD  loratadine (CLARITIN) 10 MG tablet Take 10 mg by mouth daily.   Yes Historical Provider, MD  losartan (COZAAR) 25 MG tablet Take 100 mg by mouth daily.    Yes Historical Provider, MD  metoprolol succinate (TOPROL-XL) 25 MG 24 hr tablet Take 25 mg by mouth daily.   Yes Historical Provider, MD  minocycline (DYNACIN) 50 MG tablet Take 50 mg by mouth daily. 10/28/14 11/11/14 Yes Historical Provider, MD  Multiple Vitamins-Minerals (MULTIVITAMIN WITH MINERALS) tablet Take 1 tablet by mouth daily.   Yes Historical Provider, MD     Review of Systems  Positive ROS: As above  All other systems have been reviewed and were otherwise negative with the exception of those mentioned in the HPI and as above.  Objective: Vital signs in last 24 hours: Temp:  [97.1 F (36.2 C)] 97.1 F  (36.2 C) (02/24 0816) Pulse Rate:  [70] 70 (02/24 0816) BP: (169)/(89) 169/89 mmHg (02/24 0816) SpO2:  [98 %] 98 % (02/24 0816) Weight:  [85.276 kg (188 lb)] 85.276 kg (188 lb) (02/24 0816)  General Appearance: Alert, cooperative, no distress, Head: Normocephalic, without obvious abnormality, atraumatic Eyes: PERRL, conjunctiva/corneas clear, EOM's intact,   the patient has some erythremia and periorbital swelling on the left. Ears: Normal  Throat: Normal  Neck: Supple, symmetrical, trachea midline, no adenopathy; thyroid: No enlargement/tenderness/nodules; no carotid bruit or JVD Back: Symmetric, no curvature, ROM normal, no CVA tenderness Lungs: Clear to auscultation bilaterally, respirations unlabored Heart: Regular rate and rhythm, no murmur, rub or gallop Abdomen: Soft, non-tender,, no masses, no organomegaly Extremities: Extremities normal, atraumatic, no cyanosis or edema Pulses: 2+ and symmetric all extremities Skin: Skin color, texture, turgor normal, no rashes or lesions  NEUROLOGIC:   Mental status: alert and oriented, no aphasia, good attention span, Fund of knowledge/ memory ok Motor Exam - grossly normal Sensory Exam - grossly normal Reflexes:  Coordination - grossly normal Gait - grossly normal Balance - grossly normal Cranial Nerves: I: smell Not tested  II: visual acuity  OS: Normal  OD: Normal   II: visual fields Full to confrontation  II: pupils Equal, round, reactive to light  III,VII: ptosis None  III,IV,VI: extraocular muscles  Full ROM  V: mastication Normal  V: facial light touch sensation  Normal  V,VII: corneal reflex  Present  VII: facial muscle function - upper  Normal  VII: facial muscle function - lower Normal  VIII: hearing Not tested  IX: soft palate elevation  Normal  IX,X: gag reflex Present  XI: trapezius strength  5/5  XI: sternocleidomastoid strength 5/5  XI: neck flexion strength  5/5  XII: tongue strength  Normal    Data  Review Lab Results  Component Value Date   WBC 5.5 10/17/2014   HGB 13.4 10/17/2014   HCT 40.2 10/17/2014   MCV 90.7 10/17/2014   PLT 206 10/17/2014   Lab Results  Component Value Date   NA 141 10/17/2014   K 3.9 10/17/2014   CL 104 10/17/2014   CO2 26 10/17/2014   BUN 13 10/17/2014   CREATININE 1.37* 10/17/2014   GLUCOSE 96 10/17/2014   No results found for: INR, PROTIME  Assessment/Plan: L4-5 spondylolisthesis, spinal stenosis, lumbago, lumbar radiculopathy, neurogenic claudication: I discussed the situation with the patient. I have reviewed her MRI scan and x-rays with her and pointed out the abnormality. We have discussed the various treatment options including surgery. I  have described the surgical treatment option of a L4-5 decompression, instrumentation, and fusion. I have shown her surgical models. We have discussed the risks, benefits, alternatives, and likelihood of achieving goals with surgery. I have answered all the patient's questions. She has decided to proceed with surgery.   Ophelia Charter 10/29/2014 12:43 PM

## 2014-10-29 NOTE — Transfer of Care (Signed)
Immediate Anesthesia Transfer of Care Note  Patient: Megan Daniels  Procedure(s) Performed: Procedure(s): LUMBAR FOUR-FIVE LAMINECTOMY WITH POSTERIOR LUMBAR INTERBODY FUSION WITH INTERBODY PROSTHESIS POSTERIOR LATERAL ARTHRODESIS AND POSTERIOR NONSEGMENTAL INSTRUMENTATION (N/A)  Patient Location: PACU  Anesthesia Type:General  Level of Consciousness: awake, alert , oriented and patient cooperative  Airway & Oxygen Therapy: Patient Spontanous Breathing and Patient connected to nasal cannula oxygen  Post-op Assessment: Report given to RN, Post -op Vital signs reviewed and stable and Patient moving all extremities X 4  Post vital signs: Reviewed and stable  Last Vitals:  Filed Vitals:   10/29/14 1645  BP: 127/62  Pulse: 67  Temp:   Resp: 14    Complications: No apparent anesthesia complications

## 2014-10-29 NOTE — Anesthesia Preprocedure Evaluation (Signed)
Anesthesia Evaluation  Patient identified by MRN, date of birth, ID band Patient awake    Reviewed: Allergy & Precautions, NPO status , Patient's Chart, lab work & pertinent test results  History of Anesthesia Complications (+) PONV and history of anesthetic complications  Airway Mallampati: III  TM Distance: >3 FB Neck ROM: Full    Dental  (+) Teeth Intact   Pulmonary asthma , sleep apnea and Continuous Positive Airway Pressure Ventilation , neg recent URI,  breath sounds clear to auscultation        Cardiovascular hypertension, Pt. on medications and Pt. on home beta blockers - angina- Past MI and - CHF Rhythm:Regular     Neuro/Psych Low back pain with right leg symptoms negative psych ROS   GI/Hepatic Neg liver ROS, hiatal hernia, GERD-  Medicated and Controlled,  Endo/Other    Renal/GU CRFRenal disease     Musculoskeletal  (+) Arthritis -,   Abdominal   Peds  Hematology   Anesthesia Other Findings   Reproductive/Obstetrics                             Anesthesia Physical Anesthesia Plan  ASA: III  Anesthesia Plan: General   Post-op Pain Management:    Induction: Intravenous  Airway Management Planned: Oral ETT  Additional Equipment: None  Intra-op Plan:   Post-operative Plan: Extubation in OR  Informed Consent: I have reviewed the patients History and Physical, chart, labs and discussed the procedure including the risks, benefits and alternatives for the proposed anesthesia with the patient or authorized representative who has indicated his/her understanding and acceptance.   Dental advisory given  Plan Discussed with: CRNA and Surgeon  Anesthesia Plan Comments:         Anesthesia Quick Evaluation

## 2014-10-30 LAB — CBC
HEMATOCRIT: 35.3 % — AB (ref 36.0–46.0)
Hemoglobin: 12 g/dL (ref 12.0–15.0)
MCH: 30.9 pg (ref 26.0–34.0)
MCHC: 34 g/dL (ref 30.0–36.0)
MCV: 91 fL (ref 78.0–100.0)
Platelets: 190 10*3/uL (ref 150–400)
RBC: 3.88 MIL/uL (ref 3.87–5.11)
RDW: 14 % (ref 11.5–15.5)
WBC: 10.5 10*3/uL (ref 4.0–10.5)

## 2014-10-30 LAB — BASIC METABOLIC PANEL
Anion gap: 13 (ref 5–15)
BUN: 16 mg/dL (ref 6–23)
CHLORIDE: 103 mmol/L (ref 96–112)
CO2: 21 mmol/L (ref 19–32)
Calcium: 9.3 mg/dL (ref 8.4–10.5)
Creatinine, Ser: 1.21 mg/dL — ABNORMAL HIGH (ref 0.50–1.10)
GFR calc Af Amer: 53 mL/min — ABNORMAL LOW (ref >90)
GFR calc non Af Amer: 46 mL/min — ABNORMAL LOW (ref >90)
GLUCOSE: 106 mg/dL — AB (ref 70–99)
POTASSIUM: 4.2 mmol/L (ref 3.5–5.1)
SODIUM: 137 mmol/L (ref 135–145)

## 2014-10-30 MED FILL — Sodium Chloride IV Soln 0.9%: INTRAVENOUS | Qty: 1000 | Status: AC

## 2014-10-30 MED FILL — Heparin Sodium (Porcine) Inj 1000 Unit/ML: INTRAMUSCULAR | Qty: 30 | Status: AC

## 2014-10-30 NOTE — Progress Notes (Signed)
Utilization review completed.  

## 2014-10-30 NOTE — Evaluation (Signed)
Physical Therapy Evaluation Patient Details Name: Megan Daniels MRN: [id number] DOB: 06-01-1951 Today's Date: 10/30/2014   History of Present Illness  Pt admit for L4-5decompression and fusion.   Clinical Impression  Pt admitted with above diagnosis. Pt currently with functional limitations due to the deficits listed below (see PT Problem List). Pt should progress well.  HHPT safety eval recommended.  Will not need RW.  Will need 3N1.  Needs practice on bed mobility and log roll.  Pt will benefit from skilled PT to increase their independence and safety with mobility to allow discharge to the venue listed below.      Follow Up Recommendations Home health PT;Supervision/Assistance - 24 hour    Equipment Recommendations  3in1 (PT)    Recommendations for Other Services       Precautions / Restrictions Precautions Precautions: Back;Fall Precaution Booklet Issued: Yes (comment) Precaution Comments: Educated at length re: back precautions and entire handout.   Required Braces or Orthoses: Spinal Brace Spinal Brace: Applied in sitting position;Lumbar corset (reviewed brace x 2) Restrictions Weight Bearing Restrictions: No      Mobility  Bed Mobility Overal bed mobility: Needs Assistance Bed Mobility: Rolling;Sidelying to Sit;Sit to Sidelying Rolling: Min assist Sidelying to sit: Min assist     Sit to sidelying: Min assist General bed mobility comments: Pt having difficulty remembering sequencing of log roll.  Practiced multiple times.  Needs reenforcement.  Family aware of technique and nursing also aware to practice with pt.    Transfers Overall transfer level: Needs assistance Equipment used: None Transfers: Sit to/from Stand Sit to Stand: Min guard         General transfer comment: Occasional cues for technique.  Applied brace with assist for pulling tightly as pt did not have the strength in hands to pull it tightly enought.    Ambulation/Gait Ambulation/Gait  assistance: Min assist Ambulation Distance (Feet): 200 Feet Assistive device: None Gait Pattern/deviations: Step-through pattern;Decreased stride length;Antalgic   Gait velocity interpretation: Below normal speed for age/gender General Gait Details: Pt able to ambulate without device with good safety overall.  Somewhat slow and guarded gait.    Stairs Stairs: Yes Stairs assistance: Min guard Stair Management: Step to pattern;Forwards (with HHA (steps narrow so husband will assist in front of pt) Number of Stairs: 4 General stair comments: Pt does well holding husbands hands and going up and down.    Wheelchair Mobility    Modified Rankin (Stroke Patients Only)       Balance Overall balance assessment: Needs assistance         Standing balance support: No upper extremity supported;During functional activity Standing balance-Leahy Scale: Fair Standing balance comment: can stand statically without UE support.                              Pertinent Vitals/Pain Pain Assessment: 0-10 Pain Score: 7  Pain Location: back Pain Descriptors / Indicators: Aching;Sore Pain Intervention(s): Limited activity within patient's tolerance;Monitored during session;Repositioned;Patient requesting pain meds-RN notified  VSS    Home Living Family/patient expects to be discharged to:: Private residence Living Arrangements: Spouse/significant other Available Help at Discharge: Family;Available 24 hours/day Type of Home: House Home Access: Stairs to enter Entrance Stairs-Rails: Left Entrance Stairs-Number of Steps: 3 Home Layout: One level Home Equipment: Shower seat      Prior Function Level of Independence: Independent         Comments: Pt works fulltime as hospice  nurse.       Megan Daniels        Extremity/Trunk Assessment   Upper Extremity Assessment: Defer to OT evaluation           Lower Extremity Assessment: Generalized weakness          Communication   Communication: No difficulties  Cognition Arousal/Alertness: Awake/alert Behavior During Therapy: WFL for tasks assessed/performed Overall Cognitive Status: Within Functional Limits for tasks assessed                      General Comments General comments (skin integrity, edema, etc.): Pt and family with a lot of questions.  Discussed precautions at length.  Pt has reacher and long handled sponge already.  May go buy a sock aid and toilet aide as well.      Exercises        Assessment/Plan    PT Assessment Patient needs continued PT services  PT Diagnosis Generalized weakness;Acute pain   PT Problem List Decreased balance;Decreased mobility;Decreased activity tolerance;Decreased knowledge of use of DME;Decreased knowledge of precautions;Decreased safety awareness;Pain  PT Treatment Interventions DME instruction;Gait training;Therapeutic activities;Functional mobility training;Stair training;Therapeutic exercise;Balance training;Patient/family education   PT Goals (Current goals can be found in the Care Plan section) Acute Rehab PT Goals Patient Stated Goal: to go home PT Goal Formulation: With patient Time For Goal Achievement: 11/06/14 Potential to Achieve Goals: Good    Frequency Min 5X/week   Barriers to discharge        Co-evaluation               End of Session Equipment Utilized During Treatment: Back brace;Gait belt Activity Tolerance: Patient limited by pain;Patient limited by fatigue Patient left: in chair;with call bell/phone within reach;with family/visitor present;with nursing/sitter in room Nurse Communication: Mobility status         Time: 7622-6333 PT Time Calculation (min) (ACUTE ONLY): 63 min   Charges:   PT Evaluation $Initial PT Evaluation Tier I: 1 Procedure PT Treatments $Gait Training: 23-37 mins $Self Care/Home Management: 8-22   PT G Codes:        Megan Daniels F November 16, 2014, 2:21 PM  Megan Daniels,PT Acute Rehabilitation [phone] (pager)

## 2014-10-30 NOTE — Progress Notes (Signed)
Patient ID: Megan Daniels, female   DOB: Nov 12, 1950, 64 y.o.   MRN: [id number] Subjective:  The patient is alert and pleasant. She looks well. She wants to stay until tomorrow.  Objective: Vital signs in last 24 hours: Temp:  [97.3 F (36.3 C)-99.1 F (37.3 C)] 98.1 F (36.7 C) (02/25 0741) Pulse Rate:  [58-81] 70 (02/25 0741) Resp:  [6-23] 18 (02/25 0741) BP: (99-146)/(46-97) 112/85 mmHg (02/25 0741) SpO2:  [85 %-100 %] 99 % (02/25 0741)  Intake/Output from previous day: 02/24 0701 - 02/25 0700 In: 2500 [I.V.:2500] Out: 1735 [Urine:1360; Drains:175; Blood:200] Intake/Output this shift: Total I/O In: 480 [P.O.:480] Out: -   Physical exam the patient is alert and oriented 3. Her strength is grossly normal bilateral gastrocnemius and dorsiflexors.  Lab Results:  Recent Labs  10/30/14 0624  WBC 10.5  HGB 12.0  HCT 35.3*  PLT 190   BMET  Recent Labs  10/30/14 0624  NA 137  K 4.2  CL 103  CO2 21  GLUCOSE 106*  BUN 16  CREATININE 1.21*  CALCIUM 9.3    Studies/Results: Dg Lumbar Spine 2-3 Views  10/29/2014   CLINICAL DATA:  L4-5 PLIF for spondylolisthesis and lumbar stensois  EXAM: LUMBAR SPINE - 2-3 VIEW  COMPARISON:  None.  FINDINGS: AP and lateral portable images show placement of bilateral pedicle screws at L4 and L5. A metal intervertebral cages well centered maintaining the L4-L5 disc space height. There is no acute fracture or evidence of an operative complication.  IMPRESSION: Portable lumbar spine imaging during posterior lumbar spine fusion at L4 and L5.   Electronically Signed   By: Lajean Manes M.D.   On: 10/29/2014 16:27   Dg Lumbar Spine 1 View  10/29/2014   CLINICAL DATA:  Localization image for L4-5 PLIF for spondylolisthesis and lumbar stensois  EXAM: LUMBAR SPINE - 1 VIEW  COMPARISON:  None.  FINDINGS: Surgical probe with has its tip 2.8 cm posterior to the posterior superior endplate of L5.  IMPRESSION: Surgical localization portable lateral  lumbar spine view as described.   Electronically Signed   By: Lajean Manes M.D.   On: 10/29/2014 16:25    Assessment/Plan: Postop day #1: The patient is doing well. We will continue to mobilize her with PT. She will likely go home tomorrow. I have answered all the patient's, and her husband, questions.  LOS: 1 day     Ikeem Cleckler D 10/30/2014, 9:50 AM

## 2014-10-31 ENCOUNTER — Inpatient Hospital Stay (HOSPITAL_COMMUNITY): Payer: Medicare Other

## 2014-10-31 LAB — CBC WITH DIFFERENTIAL/PLATELET
Basophils Absolute: 0 10*3/uL (ref 0.0–0.1)
Basophils Relative: 0 % (ref 0–1)
EOS ABS: 0.2 10*3/uL (ref 0.0–0.7)
Eosinophils Relative: 2 % (ref 0–5)
HEMATOCRIT: 34.7 % — AB (ref 36.0–46.0)
HEMOGLOBIN: 11.4 g/dL — AB (ref 12.0–15.0)
Lymphocytes Relative: 13 % (ref 12–46)
Lymphs Abs: 1.4 10*3/uL (ref 0.7–4.0)
MCH: 30.1 pg (ref 26.0–34.0)
MCHC: 32.9 g/dL (ref 30.0–36.0)
MCV: 91.6 fL (ref 78.0–100.0)
MONO ABS: 0.9 10*3/uL (ref 0.1–1.0)
MONOS PCT: 8 % (ref 3–12)
NEUTROS ABS: 8.3 10*3/uL — AB (ref 1.7–7.7)
NEUTROS PCT: 77 % (ref 43–77)
Platelets: 188 10*3/uL (ref 150–400)
RBC: 3.79 MIL/uL — ABNORMAL LOW (ref 3.87–5.11)
RDW: 13.9 % (ref 11.5–15.5)
WBC: 10.8 10*3/uL — ABNORMAL HIGH (ref 4.0–10.5)

## 2014-10-31 LAB — URINALYSIS, ROUTINE W REFLEX MICROSCOPIC
Bilirubin Urine: NEGATIVE
Glucose, UA: NEGATIVE mg/dL
Hgb urine dipstick: NEGATIVE
Ketones, ur: NEGATIVE mg/dL
Leukocytes, UA: NEGATIVE
NITRITE: NEGATIVE
PH: 5 (ref 5.0–8.0)
Protein, ur: NEGATIVE mg/dL
SPECIFIC GRAVITY, URINE: 1.015 (ref 1.005–1.030)
Urobilinogen, UA: 0.2 mg/dL (ref 0.0–1.0)

## 2014-10-31 MED ORDER — BISACODYL 10 MG RE SUPP
10.0000 mg | Freq: Every day | RECTAL | Status: DC | PRN
  Administered 2014-10-31: 10 mg via RECTAL
  Filled 2014-10-31: qty 1

## 2014-10-31 NOTE — Anesthesia Postprocedure Evaluation (Signed)
  Anesthesia Post-op Note  Patient: Megan Daniels  Procedure(s) Performed: Procedure(s): LUMBAR FOUR-FIVE LAMINECTOMY WITH POSTERIOR LUMBAR INTERBODY FUSION WITH INTERBODY PROSTHESIS POSTERIOR LATERAL ARTHRODESIS AND POSTERIOR NONSEGMENTAL INSTRUMENTATION (N/A)  Patient Location: PACU  Anesthesia Type:General  Level of Consciousness: awake  Airway and Oxygen Therapy: Patient Spontanous Breathing  Post-op Pain: moderate  Post-op Assessment: Post-op Vital signs reviewed, Patient's Cardiovascular Status Stable, Respiratory Function Stable, Patent Airway, No signs of Nausea or vomiting and Pain level controlled  Post-op Vital Signs: Reviewed and stable  Last Vitals:  Filed Vitals:   10/31/14 1146  BP: 138/69  Pulse: 95  Temp: 38 C  Resp: 20    Complications: No apparent anesthesia complications

## 2014-10-31 NOTE — Progress Notes (Signed)
Physical Therapy Treatment Patient Details Name: Megan Daniels MRN: [id number] DOB: October 11, 1950 Today's Date: 10/31/2014    History of Present Illness Pt admit for L4-5decompression and fusion.     PT Comments    Pt admitted with above diagnosis. Pt currently with functional limitations due to balance and endurance deficits.  Pt not feeling as well however still used time to educate pt.  Will continue to treat pt as pt tolerates.  Pt will benefit from skilled PT to increase their independence and safety with mobility to allow discharge to the venue listed below.    Follow Up Recommendations  Home health PT;Supervision/Assistance - 24 hour     Equipment Recommendations  3in1 (PT) (rollator)    Recommendations for Other Services       Precautions / Restrictions Precautions Precautions: Back;Fall Precaution Booklet Issued: Yes (comment) Precaution Comments: Educated at length re: back precautions and entire handout.   Required Braces or Orthoses: Spinal Brace Spinal Brace: Applied in sitting position;Lumbar corset Restrictions Weight Bearing Restrictions: No    Mobility  Bed Mobility Overal bed mobility: Needs Assistance Bed Mobility: Rolling;Sidelying to Sit;Sit to Sidelying Rolling: Min guard Sidelying to sit: Supervision     Sit to sidelying: Supervision General bed mobility comments: Pt having difficulty remembering sequencing of log roll therefore provided reenforcement.  FAmily aware of cuing for pt.  Practiced multiple times.    Transfers Overall transfer level: Needs assistance Equipment used: None Transfers: Sit to/from Stand Sit to Stand: Supervision         General transfer comment: Occasional cues for technique.  Applied brace with assist for pulling tightly as pt did not have the strength in hands to pull it tightly enought.    Ambulation/Gait Ambulation/Gait assistance: Supervision Ambulation Distance (Feet): 40 Feet Assistive device: None Gait  Pattern/deviations: Step-through pattern;Decreased stride length;Antalgic   Gait velocity interpretation: Below normal speed for age/gender General Gait Details: Pt able to ambulate without device with good safety overall.  Somewhat slow and guarded gait.  When fatigues, pt loses posture and will benefit from rollator for when she is home alone.     Stairs            Wheelchair Mobility    Modified Rankin (Stroke Patients Only)       Balance Overall balance assessment: Needs assistance         Standing balance support: No upper extremity supported;During functional activity Standing balance-Leahy Scale: Fair Standing balance comment: pt can stand statically without UE support.                      Cognition Arousal/Alertness: Awake/alert Behavior During Therapy: WFL for tasks assessed/performed Overall Cognitive Status: Within Functional Limits for tasks assessed                      Exercises      General Comments General comments (skin integrity, edema, etc.): Reviewed back precautions, log roll and answered other questions pt and daughter had.  Showed pt the sock aid and let her try it.  Also showed her the toilet aid and long handled shoe horn and daughter may go purchase this equipment for pt.  Called gift shop and [employer] for pricing for pt and daughter.  Pt has fever therefore assisted her back to bed.        Pertinent Vitals/Pain Pain Assessment: 0-10 Pain Score: 9  Pain Location: back Pain Descriptors / Indicators: Aching;Sore Pain  Intervention(s): Limited activity within patient's tolerance;Monitored during session;Premedicated before session;Repositioned  VSS    Home Living                      Prior Function            PT Goals (current goals can now be found in the care plan section) Progress towards PT goals: Progressing toward goals    Frequency  Min 5X/week    PT Plan Current plan remains  appropriate    Co-evaluation             End of Session Equipment Utilized During Treatment: Back brace;Gait belt Activity Tolerance: Patient limited by pain;Patient limited by fatigue Patient left: in bed;with call bell/phone within reach;with family/visitor present     Time: 0928-1009 PT Time Calculation (min) (ACUTE ONLY): 41 min  Charges:  $Gait Training: 8-22 mins $Therapeutic Activity: 8-22 mins $Self Care/Home Management: 8-22                    G Codes:      Denice Paradise 2014-11-19, 1:01 PM [place] Maleiah Dula,PT Acute Rehabilitation [phone] (pager)

## 2014-10-31 NOTE — Progress Notes (Signed)
Patient ID: Megan Daniels, female   DOB: [birth year], 64 y.o.   MRN: [id number] Subjective:  The patient is alert and pleasant. She looks better today. Her back is appropriately sore.  Objective: Vital signs in last 24 hours: Temp:  [98.1 F (36.7 C)-102.7 F (39.3 C)] 100.9 F (38.3 C) (02/26 0515) Pulse Rate:  [70-86] 84 (02/26 0400) Resp:  [16-20] 18 (02/26 0400) BP: (107-117)/(41-85) 109/48 mmHg (02/26 0400) SpO2:  [92 %-99 %] 99 % (02/26 0400)  Intake/Output from previous day: 02/25 0701 - 02/26 0700 In: 480 [P.O.:480] Out: -  Intake/Output this shift:    Physical exam the patient is alert and oriented 3. Her strength is normal in her lower extremities.  Lab Results:  Recent Labs  10/30/14 0624  WBC 10.5  HGB 12.0  HCT 35.3*  PLT 190   BMET  Recent Labs  10/30/14 0624  NA 137  K 4.2  CL 103  CO2 21  GLUCOSE 106*  BUN 16  CREATININE 1.21*  CALCIUM 9.3    Studies/Results: Dg Lumbar Spine 2-3 Views  10/29/2014   CLINICAL DATA:  L4-5 PLIF for spondylolisthesis and lumbar stensois  EXAM: LUMBAR SPINE - 2-3 VIEW  COMPARISON:  None.  FINDINGS: AP and lateral portable images show placement of bilateral pedicle screws at L4 and L5. A metal intervertebral cages well centered maintaining the L4-L5 disc space height. There is no acute fracture or evidence of an operative complication.  IMPRESSION: Portable lumbar spine imaging during posterior lumbar spine fusion at L4 and L5.   Electronically Signed   By: Lajean Manes M.D.   On: 10/29/2014 16:27   Dg Lumbar Spine 1 View  10/29/2014   CLINICAL DATA:  Localization image for L4-5 PLIF for spondylolisthesis and lumbar stensois  EXAM: LUMBAR SPINE - 1 VIEW  COMPARISON:  None.  FINDINGS: Surgical probe with has its tip 2.8 cm posterior to the posterior superior endplate of L5.  IMPRESSION: Surgical localization portable lateral lumbar spine view as described.   Electronically Signed   By: Lajean Manes M.D.   On:  10/29/2014 16:25    Assessment/Plan: Postop day #2: The patient had a fever last night. I will check a chest x-ray, urinalysis and a CBC. She will likely go home tomorrow if the studies are okay. I gave her discharge instructions and answered all her questions.  LOS: 2 days     Megan Daniels D 10/31/2014, 7:10 AM

## 2014-10-31 NOTE — Progress Notes (Signed)
Patient with high temperature of 100.4. MD aware and no new order received. Patient using incentive spirometer per protocol. Tylenol also given. RN will continue to monitor patient. Tomma Rakers RN

## 2014-11-01 LAB — URINE CULTURE
COLONY COUNT: NO GROWTH
Culture: NO GROWTH
SPECIAL REQUESTS: NORMAL

## 2014-11-01 MED ORDER — OXYCODONE-ACETAMINOPHEN 5-325 MG PO TABS: 1.0000 | ORAL_TABLET | ORAL | Status: DC | PRN

## 2014-11-01 MED ORDER — DIAZEPAM 5 MG PO TABS: 5.0000 mg | ORAL_TABLET | Freq: Four times a day (QID) | ORAL | Status: DC | PRN

## 2014-11-01 MED ORDER — ASPIRIN EC 81 MG PO TBEC: 81.0000 mg | DELAYED_RELEASE_TABLET | Freq: Every day | ORAL | Status: AC

## 2014-11-01 NOTE — Progress Notes (Signed)
Patient alert and oriented, mae's well, voiding adequate amount of urine, swallowing without difficulty, c/o minimal  pain. Patient discharged home with family. Script and discharged instructions given to patient. Patient and family stated understanding of d/c instructions given and has an appointment with MD. Aisha Carleena Mires RN  

## 2014-11-01 NOTE — Discharge Instructions (Signed)
Wound Care Leave incision open to air. You may shower. Do not scrub directly on incision.  Leave steri-strips on incision.  They will fall off by themselves. Do not put any creams, lotions, or ointments on incision. Activity Walk each and every day, increasing distance each day. No lifting greater than 5 lbs.  Avoid bending, arching, and twisting. No driving for 2 weeks; may ride as a passenger locally. If provided with back brace, wear when out of bed.  It is not necessary to wear in bed. Diet Resume your normal diet.  Return to Work Will be discussed at you follow up appointment. Call Your Doctor If Any of These Occur Redness, drainage, or swelling at the wound.  Temperature greater than 101 degrees. Severe pain not relieved by pain medication. Incision starts to come apart. Follow Up Appt Call today for appointment in 3weeks [phone]) or for problems.  If you have any hardware placed in your spine, you will need an x-ray before your appointment.

## 2014-11-01 NOTE — Care Management Note (Signed)
    Page 1 of 2   11/01/2014     10:15:21 AM CARE MANAGEMENT NOTE 11/01/2014  Patient:  Megan Daniels, Megan Daniels   Account Number:  [id number]  Date Initiated:  11/01/2014  Documentation initiated by:  [facility]  Subjective/Objective Assessment:   adm: Lumbar spondylolisthesis     Action/Plan:   discharge planning   Anticipated DC Date:  11/01/2014   Anticipated DC Plan:  [place]  CM consult      [facility] Choice  HOME HEALTH   Choice offered to / List presented to:  C-1 Patient   DME arranged  Vassie Moselle      DME agency  [place] arranged  [place].   Status of service:  Completed, signed off Medicare Important Message given?   (If response is "NO", the following Medicare IM given date fields will be blank) Date Medicare IM given:   Medicare IM given by:   Date Additional Medicare IM given:   Additional Medicare IM given by:    Discharge Disposition:  [place]  Per UR Regulation:    If discussed at Long Length of Stay Meetings, dates discussed:    Comments:  11/01/14 09:00 CM met with pt in room to offer choice of home health agency.  Pt chooses [facility] to render HHPT. Address and contact information verified. Referral called to [facility] rep, [place].  DME (3n1 and rolator already delivered to pt).  No other CM needs were communicated.  No other CM needs were communicated.  Mariane Masters, BSN, [place].

## 2014-11-01 NOTE — Discharge Summary (Signed)
  Physician Discharge Summary  Patient ID: Megan Daniels MRN: [id number] DOB/AGE: October 26, 1950 64 y.o.  Admit date: 10/29/2014 Discharge date: 11/01/2014  Admission Diagnoses: Lumbar spondylolisthesis  Discharge Diagnoses: Same Active Problems:   Spondylolisthesis of lumbar region   Discharged Condition: Stable  Hospital Course:  Mrs. Megan Daniels is a 64 y.o. female admitted after elective L4-5 PLIF. Postoperatively, the patient has had an uneventful hospital course. She ambulates well, tolerates diet, and is voiding normally. She did have a fever on POD#1, and blood cultures, CBC, urine culture and CXR were obtained. These studies were negative, with final blood cultures pending.  Treatments: Surgery - L4-5 PLIF  Discharge Exam: Blood pressure 123/53, pulse 72, temperature 99.5 F (37.5 C), temperature source Oral, resp. rate 18, height 5\' 3"  (1.6 m), weight 85.276 kg (188 lb), SpO2 100 %. Awake, alert, oriented Speech fluent, appropriate CN grossly intact 5/5 BUE/BLE Wound c/d/i  Follow-up: Follow-up in Dr. Arnoldo Morale' office [facility] [phone]) in 2-3 weeks  Disposition: Home     Medication List    TAKE these medications        aspirin EC 81 MG tablet  Take 1 tablet (81 mg total) by mouth daily.  Start taking on:  11/14/2014     CALTRATE 600+D PO  Take 1 tablet by mouth daily.     cholecalciferol 1000 UNITS tablet  Commonly known as:  VITAMIN D  Take 2,000 Units by mouth daily.     diazepam 5 MG tablet  Commonly known as:  VALIUM  Take 1 tablet (5 mg total) by mouth every 6 (six) hours as needed for muscle spasms.     docusate sodium 100 MG capsule  Commonly known as:  COLACE  Take 100 mg by mouth 2 (two) times daily.     lansoprazole 30 MG capsule  Commonly known as:  PREVACID  Take 30 mg by mouth 2 (two) times daily before a meal.     loratadine 10 MG tablet  Commonly known as:  CLARITIN  Take 10 mg by mouth daily.     losartan 25 MG tablet  Commonly known as:  COZAAR  Take 100 mg by mouth daily.     metoprolol succinate 25 MG 24 hr tablet  Commonly known as:  TOPROL-XL  Take 25 mg by mouth daily.     minocycline 50 MG tablet  Commonly known as:  DYNACIN  Take 50 mg by mouth daily.     multivitamin with minerals tablet  Take 1 tablet by mouth daily.     oxyCODONE-acetaminophen 5-325 MG per tablet  Commonly known as:  PERCOCET/ROXICET  Take 1-2 tablets by mouth every 4 (four) hours as needed for moderate pain.     vitamin C with rose hips 1000 MG tablet  Take 1,000 mg by mouth daily.         SignedConsuella Lose, C 11/01/2014, 8:58 AM

## 2014-11-01 NOTE — Progress Notes (Signed)
Physical Therapy Treatment and Discharge Patient Details Name: Megan Daniels MRN: [id number] DOB: 02-09-1951 Today's Date: 11/01/2014    History of Present Illness Pt admit for L4-5decompression and fusion.     PT Comments    Pt mobilizing at mod I level, reviewed precautions, posture, activity level upon return home, and other d/c concerns. Pt has met acute PT goals, ready for d/c with f/u by HHPT.  Follow Up Recommendations  Home health PT;Supervision/Assistance - 24 hour     Equipment Recommendations  3in1 (PT) (rollator)    Recommendations for Other Services       Precautions / Restrictions Precautions Precautions: Back;Fall Precaution Booklet Issued: No Precaution Comments: pt able to verbalize precautions and keep them with mobility. Reviewed posture and activity level upon return home.  Required Braces or Orthoses: Spinal Brace Spinal Brace: Applied in sitting position;Lumbar corset Restrictions Weight Bearing Restrictions: No    Mobility  Bed Mobility Overal bed mobility: Modified Independent Bed Mobility: Rolling;Sidelying to Sit;Sit to Sidelying Rolling: Modified independent (Device/Increase time) Sidelying to sit: Modified independent (Device/Increase time)     Sit to sidelying: Modified independent (Device/Increase time) General bed mobility comments: practiced bed mobility in and out of bed again and pt performing appropriately now. Also practiced moving side to side within bed with modified bridge and abdominal activation  Transfers Overall transfer level: Modified independent Equipment used: None Transfers: Sit to/from Stand Sit to Stand: Modified independent (Device/Increase time)            Ambulation/Gait Ambulation/Gait assistance: Modified independent (Device/Increase time) Ambulation Distance (Feet): 125 Feet Assistive device: None Gait Pattern/deviations: Step-through pattern;Antalgic;Decreased stride length Gait velocity:  decreased Gait velocity interpretation: Below normal speed for age/gender General Gait Details: continues with slow guarded gait, discussed use of rollator when home alone and also ability to use it as seat to prevent standing for too long periods of time, eg in kitchen (this was a problem for her PTA)   Stairs            Wheelchair Mobility    Modified Rankin (Stroke Patients Only)       Balance Overall balance assessment: Needs assistance         Standing balance support: No upper extremity supported Standing balance-Leahy Scale: Fair Standing balance comment: balance continues to be limited due to pain but pt able to perform gentle dynamic activity without support and no LOB, unable to accept challenges, especially unexpected                    Cognition Arousal/Alertness: Awake/alert Behavior During Therapy: WFL for tasks assessed/performed Overall Cognitive Status: Within Functional Limits for tasks assessed                      Exercises      General Comments General comments (skin integrity, edema, etc.): Discussed car ride home and general activity upon d/c, discussed appropriate seating at home as well as tips for comfort in bed. Pt ready for d/c later today, f/u with HHPT      Pertinent Vitals/Pain Pain Assessment: 0-10 Pain Score: 3  Pain Location: back Pain Descriptors / Indicators: Aching Pain Intervention(s): Monitored during session    Home Living                      Prior Function            PT Goals (current goals can now be found in the  care plan section) Acute Rehab PT Goals Patient Stated Goal: to go home PT Goal Formulation: With patient Time For Goal Achievement: 11/06/14 Potential to Achieve Goals: Good Progress towards PT goals: Goals met/education completed, patient discharged from PT    Frequency  Min 5X/week    PT Plan Current plan remains appropriate    Co-evaluation             End of  Session Equipment Utilized During Treatment: Back brace Activity Tolerance: Patient tolerated treatment well Patient left: in chair;with call bell/phone within reach;with family/visitor present     Time: 0801-0833 PT Time Calculation (min) (ACUTE ONLY): 32 min  Charges:  $Gait Training: 8-22 mins $Therapeutic Activity: 8-22 mins                    G Codes:     Leighton Roach, PT  Acute Rehab Services  [place], [place] 11/01/2014, 9:05 AM

## 2014-11-06 LAB — CULTURE, BLOOD (ROUTINE X 2)
Culture: NO GROWTH
Culture: NO GROWTH

## 2014-12-10 ENCOUNTER — Encounter
Admit: 2014-12-10 | Disposition: A | Discharge status: Home / Self Care | Payer: Self-pay | Attending: Neurosurgery | Admitting: Neurosurgery

## 2014-12-26 NOTE — Op Note (Signed)
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   PROCEDURE: Left middle finger distal interphalangeal (DIP) arthrodesis.   SURGEON: Laurene Footman. MD.    ANESTHESIA: General.   DESCRIPTION OF PROCEDURE: The patient was brought to the operating room and after adequate general anesthesia was obtained, the left arm was prepped and draped in the usual sterile fashion with a tourniquet applied to the upper arm. After patient identification and timeout procedures were completed, a T-shaped incision was made centered over the DIP joint, with care being taken not to damage the nail bed. There was a large dorsal spur which was removed. On examination of the joint, there was some crystalline disease present and no cartilage remaining. A rongeur was used to remove the sclerotic bone until there were adequate bleeding bony surfaces on both sides of the joint. At this point, K-wire was inserted through the tip of the phalanx across the IP joint with the DIP joint in slight flexion. This was then drilled with a small hand drill from the Acumed tray, and a 24 mm fusion device was utilized. With the Acutrak fusion device placed, it was countersunk so it would not be prominent at the tip of the finger. The prior mucous cyst was also excised along with a spur. After this had been completed, mini C-arm view showed very good alignment. There was a slight gap on the radial side of the IP joint, and a small amount of bone putty was placed into the site to try to aid in bony union. The wound was then irrigated and closed with a simple interrupted 4-0 nylon suture. A digital block was also placed for postop analgesia with 0.5% Sensorcaine 10 mL. A sterile dressing of Xeroform, 2 x 2's and  a finger roll was applied. The patient was sent to the recovery room in stable condition.   ESTIMATED BLOOD LOSS: Minimal.   COMPLICATIONS: None.   TOURNIQUET TIME: 31 minutes at 250 mmHg.   IMPLANT: Acumed 24 mm Acutrak fusion device.    ____________________________ Laurene Footman, MD mjm:gb D: 11/16/2012 00:37:12 ET T: 11/16/2012 06:46:13 ET JOB#: [id number]  cc: Laurene Footman, MD, <Dictator> Laurene Footman MD ELECTRONICALLY SIGNED 11/16/2012 7:30

## 2014-12-27 NOTE — Op Note (Signed)
PATIENT NAME:  Megan Daniels, Megan Daniels MR#:  [id number] DATE OF BIRTH:  [birth year]  DATE OF PROCEDURE:  03/18/2014  PREOPERATIVE DIAGNOSIS: Recurrent right carpal tunnel syndrome.   POSTOPERATIVE DIAGNOSES: Right carpal tunnel syndrome, right carpal tunnel release, median nerve exploration.   ANESTHESIA: General.   SURGEON:  Hessie Knows, MD    DESCRIPTION OF PROCEDURE: The patient was brought to the operating room, and after adequate anesthesia was obtained, the right arm was prepped and draped in the usual sterile fashion with a tourniquet applied to the upper arm. After patient identification and timeout procedures were completed, the tourniquet was raised to 250 mmHg. Incision was made just ulnar to the prior incision going down through the skin and subcutaneous tissue. The transverse carpal ligament was identified and incised from distal to proximal with a vascular  hemostat placed underneath this to protect the underlying structures. Flexor tendon was identified. The transverse carpal ligament was very thick and there appeared to be extensive scarring present through the prior release. This portion of the transverse carpal ligament was excised to allow for a greater space for the nerve. There is moderate flexor tenosynovitis present as well. Release was carried out approximately 2 cm proximal to the wrist flexion crease and then nerve identified and tracked down distally. There did appear to be some scar tissue at the level of the wrist flexion crease with a band of tissue holding down the nerve. After release, there appeared to have been a compression directly on the nerve from this. Going more distally, the motor branch was identified and was intact. Again, it had adjacent scar tissue which was released. The nerve was traced out distally with no further scar tissue appearing to impinge  upon it.  The wound was thoroughly irrigated and the subcutaneous tissue was infiltrated with 10 mL of 0.5% Sensorcaine  without epinephrine. The wound was closed with simple interrupted 4-0 nylon skin sutures. Xeroform, 4 x 4's, Webril, and Ace wrap were applied and the patient was sent to the recovery room in stable condition.   ESTIMATED BLOOD LOSS: Minimal.   COMPLICATIONS: None.   SPECIMEN: None.   TOURNIQUET TIME: 36 minutes at 250 mmHg.   ____________________________ Laurene Footman, MD mjm:dd D: 03/18/2014 20:40:16 ET T: 03/18/2014 21:09:30 ET JOB#: [id number]  cc: Laurene Footman, MD, <Dictator> Laurene Footman MD ELECTRONICALLY SIGNED 03/19/2014 6:27

## 2015-01-06 ENCOUNTER — Ambulatory Visit: Payer: Medicare Other | Attending: Physical Medicine and Rehabilitation | Admitting: Physical Therapy

## 2015-01-06 ENCOUNTER — Encounter: Payer: Self-pay | Admitting: Physical Therapy

## 2015-01-06 DIAGNOSIS — M6281 Muscle weakness (generalized): Secondary | ICD-10-CM | POA: Insufficient documentation

## 2015-01-06 DIAGNOSIS — M4326 Fusion of spine, lumbar region: Secondary | ICD-10-CM | POA: Diagnosis not present

## 2015-01-06 DIAGNOSIS — M545 Low back pain, unspecified: Secondary | ICD-10-CM

## 2015-01-06 DIAGNOSIS — Z4789 Encounter for other orthopedic aftercare: Secondary | ICD-10-CM | POA: Diagnosis present

## 2015-01-06 NOTE — Therapy (Signed)
[facility] [address] [place], [place], [address] Phone: [phone]   Fax:  [phone]  Physical Therapy Treatment  Patient Details  Name: Megan Daniels MRN: [id number] Date of Birth: 11-06-1950 Referring Provider:  Newman Pies, MD  Encounter Date: 01/06/2015      PT End of Session - 01/06/15 1808    Visit Number 8   Number of Visits 10   Date for PT Re-Evaluation 01/07/15   Authorization - Visit Number 8   Authorization - Number of Visits 10   PT Start Time 8185   PT Stop Time 6314   PT Time Calculation (min) 57 min   Equipment Utilized During Treatment Back brace   Activity Tolerance No increased pain;Patient limited by fatigue   Behavior During Therapy [facility] for tasks assessed/performed      Past Medical History  Diagnosis Date  . PONV (postoperative nausea and vomiting)     extreme nausea and vomiting  . Stress incontinence   . Palpitations     takes Metoprolol daily  . Hypertension     takes Losartan daily  . Sarcoidosis   . Asthma     Symbicort as needed  . URI (upper respiratory infection)     treated self with Mucinex and no fever  . Seasonal allergies     takes Claritin daily  . Pneumonia     hx of-early 2000's  . History of bronchitis   . Weakness     numbness and tingling in both legs  . Foot drop     right  . Arthritis   . Joint pain   . Chronic back pain     spondylolisthesis/stenosis  . Vitamin D deficiency   . History of hiatal hernia   . GERD (gastroesophageal reflux disease)     takes Prevacid daily  . Diverticulosis   . History of colon polyps   . Urinary urgency   . Chronic UTI     was treated for 3 months with Macrobid and in Dec everything was fine  . Constipation   . Chalazion of left eye     Past Surgical History  Procedure Laterality Date  . Tonsillectomy    . Abdominal hysterectomy    . Cervical fusion    . Nissan fundoplication    . Bunionectomy Bilateral    . Cataract surgery Bilateral   . Back surgery    . Carpal tunnel release Right     x 2  . Left middle finger surgery      pin  . Colonoscopy    . Esophagogastroduodenoscopy      There were no vitals filed for this visit.  Visit Diagnosis:  Midline low back pain without sciatica  Muscle weakness      Subjective Assessment - 01/06/15 1803    Subjective Pt. reports no pain at this time.  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      Limitations Lifting;Standing;Other (comment)   Patient Stated Goals return to work with no limitations/ pain.    Currently in Pain? No/denies          OBJECTIVE:  Modified Oswestry: 30% (self-perceived disability)- marked improvement since initial evaluation.  Nustep L6 10 min. B UE/LE (warm-up/no charge).  Box lifting/ carrying 20# in gym working on proper lifting/ body mechanics (8 min.).  Climbing steps with recip. Pattern and 15-20# bag working on core  stability/ proper technique/ safety.  Supine core stability ex. With ball (knee to chest with white ball/ bridging/ gentle trunk rotn) 10x2 each.  Supine LE/lumbar stretches focusing on hamstring/ hip ER/IR/ piriformis and nerve glides in LE.      Pt response for medical necessity:  Fatigue noted in core/LE after there.ex. Pt. Benefiting from progressive core stability/ functional activities in gym with lifting/ posture.  Decrease verbal cuing for core stability with functional tasks at end of tx.  No increase back pain.                              PT Long Term Goals - 01/06/15 1814    PT LONG TERM GOAL #1   Title Pt. able to return to work as [employer]'s [employer] with proper lifting/ body mechanics and no low back pain.    Time 4   Period Weeks   Status Partially Met   PT LONG TERM GOAL #2   Title Pt. will decrease Oswestry to <20% to improve pain-free mobility/ return to work.    Baseline currently 30% (14%  improvement since initial evaluation).     Time 4   Period Weeks   Status Partially Met   PT LONG TERM GOAL #3   Title Pt. able to tolerate >1 hour of standing activities with no increase c/o back pain.    Time 4   Period Weeks   Status Partially Met   PT LONG TERM GOAL #4   Title Pt. I with HEP to increase lumbar/ abdominal strength to WNL to improve pain-free functional mobility.     Time 4   Period Weeks   Status Partially Met               Plan - 01/06/15 1811    Clinical Impression Statement Pt. demonstrates proper lifting technique/ carrying of 20# box with handles and no pain.  Pt. able to ascend/descend 4 steps with 20# bag and 1 handrail safely/ recip. pattern.  Progressing well with core stability and supine/ standing ther.ex.    Pt will benefit from skilled therapeutic intervention in order to improve on the following deficits Decreased endurance;Improper body mechanics;Impaired flexibility;Decreased strength;Decreased mobility;Pain;Decreased range of motion   Rehab Potential Good   PT Frequency 2x / week   PT Duration 4 weeks   PT Treatment/Interventions Gait training;Neuromuscular re-education;Stair training;Functional mobility training;Patient/family education;Therapeutic activities;Cryotherapy;Manual techniques;Therapeutic exercise;Balance training   PT Next Visit Plan Reassess all goals/ recert patient for 4 weeks with decreasing frequency to 1x/week with more of a HEP focus.    PT Home Exercise Plan TrA stability ex. program/ posture resisted ex.    Consulted and Agree with Plan of Care Patient        Problem List Patient Active Problem List   Diagnosis Date Noted  . Spondylolisthesis of lumbar region 10/29/2014    Pura Spice 01/06/2015, 6:18 PM  [facility] [address] [place], [place], [address] Phone: [phone]   Fax:  [phone]

## 2015-01-08 ENCOUNTER — Ambulatory Visit: Payer: Medicare Other | Admitting: Physical Therapy

## 2015-01-08 DIAGNOSIS — M545 Low back pain, unspecified: Secondary | ICD-10-CM

## 2015-01-08 DIAGNOSIS — Z4789 Encounter for other orthopedic aftercare: Secondary | ICD-10-CM | POA: Diagnosis not present

## 2015-01-08 DIAGNOSIS — M6281 Muscle weakness (generalized): Secondary | ICD-10-CM

## 2015-01-08 NOTE — Therapy (Signed)
[facility] [address] [place], [place], [address] Phone: [phone]   Fax:  [phone]  Physical Therapy Treatment  Patient Details  Name: Megan Daniels MRN: [id number] Date of Birth: Aug 28, 1951 Referring Provider:  Idelle Crouch, MD  Encounter Date: 01/08/2015      PT End of Session - 01/08/15 1727    Visit Number 9   Number of Visits 10   Date for PT Re-Evaluation 02/05/15   Authorization - Visit Number 9   Authorization - Number of Visits 10   PT Start Time 2876   PT Stop Time 8115   PT Time Calculation (min) 52 min   Equipment Utilized During Treatment Back brace   Activity Tolerance No increased pain;Patient limited by fatigue   Behavior During Therapy [facility] for tasks assessed/performed      Past Medical History  Diagnosis Date  . PONV (postoperative nausea and vomiting)     extreme nausea and vomiting  . Stress incontinence   . Palpitations     takes Metoprolol daily  . Hypertension     takes Losartan daily  . Sarcoidosis   . Asthma     Symbicort as needed  . URI (upper respiratory infection)     treated self with Mucinex and no fever  . Seasonal allergies     takes Claritin daily  . Pneumonia     hx of-early 2000's  . History of bronchitis   . Weakness     numbness and tingling in both legs  . Foot drop     right  . Arthritis   . Joint pain   . Chronic back pain     spondylolisthesis/stenosis  . Vitamin D deficiency   . History of hiatal hernia   . GERD (gastroesophageal reflux disease)     takes Prevacid daily  . Diverticulosis   . History of colon polyps   . Urinary urgency   . Chronic UTI     was treated for 3 months with Macrobid and in Dec everything was fine  . Constipation   . Chalazion of left eye     Past Surgical History  Procedure Laterality Date  . Tonsillectomy    . Abdominal hysterectomy    . Cervical fusion    . Nissan fundoplication    . Bunionectomy Bilateral    . Cataract surgery Bilateral   . Back surgery    . Carpal tunnel release Right     x 2  . Left middle finger surgery      pin  . Colonoscopy    . Esophagogastroduodenoscopy      There were no vitals filed for this visit.  Visit Diagnosis:  Midline low back pain without sciatica  Muscle weakness      Subjective Assessment - 01/08/15 1642    Subjective Pt. reports no pain.  Pt. still has nerve pain in R foot but pain is better since surgery.     Limitations Lifting;Standing;Other (comment)   Patient Stated Goals return to work with no limitations/ pain.    Currently in Pain? No/denies       OBJECTIVE:  Standing core Nautilus: 30# lat. Pull downs (20x)/ 30# scap. Retraction (20x)/ 10# biceps curls (20x)/ 20# walk outs L/R 5x each with core muscle control.  TG knee flexion 30x with core activation reminders (great technique).  Supine/ seated ball ex. (knee to chest/ bridging/ marching/ alt. UE and LE/ gentle trunk rotn.)- 10x each.  Supine LE/ lumbar stretches-  (14 min.).  Nustep L6 10 min. B UE/LE (no charge).         Pt response for medical necessity:  No increase c/o back pain but reports of low back fatigue and 1 sharp muscle cramp in R hip during R LE/ hip stretching.          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    Time 4   Period Weeks   Status Partially Met   PT LONG TERM GOAL #2   Title Pt. will decrease Oswestry to <20% to improve pain-free mobility/ return to work.    Baseline currently 30% (14% improvement since initial evaluation).     Time 4   Period Weeks   Status Partially Met   PT LONG TERM GOAL #3   Title Pt. able to tolerate >1 hour of standing activities with no increase c/o back pain.    Time 4   Period Weeks   Status Partially Met   PT LONG TERM GOAL #4   Title Pt. I with HEP to increase lumbar/ abdominal strength  to WNL to improve pain-free functional mobility.     Time 4   Period Weeks   Status Partially Met            G-Codes - 02-02-2015 1739    Functional Assessment Tool Used clinical judgement/ Oswestry/ pain   Functional Limitation Changing and maintaining body position   Changing and Maintaining Body Position Current Status (J2878) At least 20 percent but less than 40 percent impaired, limited or restricted      Problem List Patient Active Problem List   Diagnosis Date Noted  . Spondylolisthesis of lumbar region 10/29/2014    Pura Spice, PT, DPT # [phone]  2015/02/02, 5:42 PM  [facility] [address] [place], [place], [address] Phone: [phone]   Fax:  [phone]

## 2015-01-12 ENCOUNTER — Ambulatory Visit: Payer: Medicare Other | Admitting: Physical Therapy

## 2015-01-12 DIAGNOSIS — Z4789 Encounter for other orthopedic aftercare: Secondary | ICD-10-CM | POA: Diagnosis not present

## 2015-01-12 DIAGNOSIS — M545 Low back pain, unspecified: Secondary | ICD-10-CM

## 2015-01-12 DIAGNOSIS — M6281 Muscle weakness (generalized): Secondary | ICD-10-CM

## 2015-01-12 NOTE — Therapy (Signed)
[facility] [address] [place], [place], [address] Phone: [phone]   Fax:  [phone]  Physical Therapy Treatment  Patient Details  Name: Megan Daniels MRN: [id number] Date of Birth: 1951-02-26 Referring Provider:  Idelle Crouch, MD  Encounter Date: 01/12/2015      PT End of Session - 01/12/15 1727    Visit Number 10   Number of Visits 18   Date for PT Re-Evaluation 02/05/15   Authorization - Visit Number 10   Authorization - Number of Visits 18   PT Start Time 4562   PT Stop Time 1730   PT Time Calculation (min) 61 min   Equipment Utilized During Treatment Back brace   Activity Tolerance No increased pain;Patient limited by fatigue   Behavior During Therapy [facility] for tasks assessed/performed      Past Medical History  Diagnosis Date  . PONV (postoperative nausea and vomiting)     extreme nausea and vomiting  . Stress incontinence   . Palpitations     takes Metoprolol daily  . Hypertension     takes Losartan daily  . Sarcoidosis   . Asthma     Symbicort as needed  . URI (upper respiratory infection)     treated self with Mucinex and no fever  . Seasonal allergies     takes Claritin daily  . Pneumonia     hx of-early 2000's  . History of bronchitis   . Weakness     numbness and tingling in both legs  . Foot drop     right  . Arthritis   . Joint pain   . Chronic back pain     spondylolisthesis/stenosis  . Vitamin D deficiency   . History of hiatal hernia   . GERD (gastroesophageal reflux disease)     takes Prevacid daily  . Diverticulosis   . History of colon polyps   . Urinary urgency   . Chronic UTI     was treated for 3 months with Macrobid and in Dec everything was fine  . Constipation   . Chalazion of left eye     Past Surgical History  Procedure Laterality Date  . Tonsillectomy    . Abdominal hysterectomy    . Cervical fusion    . Nissan fundoplication    . Bunionectomy Bilateral    . Cataract surgery Bilateral   . Back surgery    . Carpal tunnel release Right     x 2  . Left middle finger surgery      pin  . Colonoscopy    . Esophagogastroduodenoscopy      There were no vitals filed for this visit.  Visit Diagnosis:  Midline low back pain without sciatica  Muscle weakness      Subjective Assessment - 01/12/15 1637    Subjective Pt. states her back is doing better today but aggravated her back last Thursday night changing the router in her house (slight twisting involved).  Pain resolved by next day.  Pt. c/o 3-4/10 B knee pain.     Limitations Standing;Walking;Lifting   Patient Stated Goals return to work with no limitations/ pain.    Currently in Pain? Yes   Pain Score 4    Pain Location Knee   Pain Orientation Right;Left   Pain Descriptors / Indicators Aching   Pain Type Chronic pain         OBJECTIVE:  Nustep L6 10 min. B UE/LE (no charge).Standing  core Nautilus: 40# lat. Pull downs (20x)/ 40# scap. Retraction (20x)/ 20# tricep ext. (20x)/ 30# walk outs L/R 5x each with core muscle control.  Supine 2# dumbbells deadbug/ bridging 20x each.  Box lifting/ B carrying (15-20#)- instruct in proper technique/ modified knee flexion due to pain (18 min.).  Supine LE/ lumbar stretches- (17 min.).    Pt response for medical necessity: Low back fatigue and c/o R foot numbness with standing after manual LE stretches.           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    Time 4   Period Weeks   Status Partially Met   PT LONG TERM GOAL #2   Title Pt. will decrease Oswestry to <20% to improve pain-free mobility/ return to work.    Baseline currently 30% (14% improvement since initial evaluation).     Time 4   Period Weeks   Status Partially Met   PT LONG TERM GOAL #3   Title Pt. able to tolerate >1 hour of  standing activities with no increase c/o back pain.    Time 4   Period Weeks   Status Partially Met   PT LONG TERM GOAL #4   Title Pt. I with HEP to increase lumbar/ abdominal strength to WNL to improve pain-free functional mobility.     Time 4   Period Weeks   Status Partially Met               Plan - 01/12/15 1728    Clinical Impression Statement Marked improvement in body/lifting mechanics with improved posture.  Pt. limited more by B knee pain with floor to waist lifting than back pain.  Progressing with core stability ex./ increase wt. on Nautilius.    Pt will benefit from skilled therapeutic intervention in order to improve on the following deficits Decreased endurance;Improper body mechanics;Impaired flexibility;Decreased strength;Decreased mobility;Pain;Decreased range of motion   Rehab Potential Good   PT Frequency 2x / week   PT Duration 4 weeks   PT Treatment/Interventions Gait training;Neuromuscular re-education;Stair training;Functional mobility training;Patient/family education;Therapeutic activities;Cryotherapy;Manual techniques;Therapeutic exercise;Balance training   PT Next Visit Plan Progress core stability/ check for MD signature on last recert   PT Home Exercise Plan TrA stability ex. program/ posture resisted ex.    Consulted and Agree with Plan of Care Patient       Problem List Patient Active Problem List   Diagnosis Date Noted  . Spondylolisthesis of lumbar region 10/29/2014    Pura Spice, PT, DPT # [phone]   01/12/2015, 5:32 PM  [facility] [address] [place], [place], [address] Phone: [phone]   Fax:  [phone]

## 2015-01-13 ENCOUNTER — Ambulatory Visit: Payer: Medicare Other | Admitting: Physical Therapy

## 2015-01-14 ENCOUNTER — Ambulatory Visit: Payer: Medicare Other | Admitting: Physical Therapy

## 2015-01-14 DIAGNOSIS — M545 Low back pain, unspecified: Secondary | ICD-10-CM

## 2015-01-14 DIAGNOSIS — M6281 Muscle weakness (generalized): Secondary | ICD-10-CM

## 2015-01-14 DIAGNOSIS — Z4789 Encounter for other orthopedic aftercare: Secondary | ICD-10-CM | POA: Diagnosis not present

## 2015-01-14 NOTE — Therapy (Signed)
[facility] [address] [place], [place], [address] Phone: [phone]   Fax:  [phone]  Physical Therapy Treatment  Patient Details  Name: Megan Daniels MRN: [id number] Date of Birth: 1951/08/18 Referring Provider:  Idelle Crouch, MD  Encounter Date: 01/14/2015      PT End of Session - 01/14/15 1641    Visit Number 11   Number of Visits 18   Date for PT Re-Evaluation 02/05/15   Authorization - Visit Number 11   Authorization - Number of Visits 18   PT Start Time 1632   PT Stop Time 4034   PT Time Calculation (min) 59 min   Activity Tolerance No increased pain;Patient limited by fatigue   Behavior During Therapy [facility] for tasks assessed/performed      Past Medical History  Diagnosis Date  . PONV (postoperative nausea and vomiting)     extreme nausea and vomiting  . Stress incontinence   . Palpitations     takes Metoprolol daily  . Hypertension     takes Losartan daily  . Sarcoidosis   . Asthma     Symbicort as needed  . URI (upper respiratory infection)     treated self with Mucinex and no fever  . Seasonal allergies     takes Claritin daily  . Pneumonia     hx of-early 2000's  . History of bronchitis   . Weakness     numbness and tingling in both legs  . Foot drop     right  . Arthritis   . Joint pain   . Chronic back pain     spondylolisthesis/stenosis  . Vitamin D deficiency   . History of hiatal hernia   . GERD (gastroesophageal reflux disease)     takes Prevacid daily  . Diverticulosis   . History of colon polyps   . Urinary urgency   . Chronic UTI     was treated for 3 months with Macrobid and in Dec everything was fine  . Constipation   . Chalazion of left eye     Past Surgical History  Procedure Laterality Date  . Tonsillectomy    . Abdominal hysterectomy    . Cervical fusion    . Nissan fundoplication    . Bunionectomy Bilateral   . Cataract surgery Bilateral   . Back surgery     . Carpal tunnel release Right     x 2  . Left middle finger surgery      pin  . Colonoscopy    . Esophagogastroduodenoscopy      There were no vitals filed for this visit.  Visit Diagnosis:  Midline low back pain without sciatica  Muscle weakness      Subjective Assessment - 01/14/15 1637    Subjective Pt. reports having a good day at work today with no increase c/o pain reported.  Pt. able to squat down a few times during pt. assessments today.     Limitations Standing;Walking;Lifting   Patient Stated Goals return to work with no limitations/ pain.    Currently in Pain? Yes   Pain Score 3    Pain Location Knee   Pain Orientation Right;Left   Pain Type Chronic pain      OBJECTIVE: Nustep L7 10 min. B UE/LE (no charge).Standing core Nautilus: 2# dumbbells bicep curls/ triceps/ press-ups/ alt. UE and LE 20x each.  Seated ball pelvic tilts/ alt. UE and LE/ wt. Shifting/ marching/ heel raises/  toe raises 20x.  Manual tx.:Supine LE/ lumbar stretches- (15 min.).    Pt response for medical necessity: Low back fatigue but no increase c/o pain.         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    Time 4   Period Weeks   Status Partially Met   PT LONG TERM GOAL #2   Title Pt. will decrease Oswestry to <20% to improve pain-free mobility/ return to work.    Baseline currently 30% (14% improvement since initial evaluation).     Time 4   Period Weeks   Status Partially Met   PT LONG TERM GOAL #3   Title Pt. able to tolerate >1 hour of standing activities with no increase c/o back pain.    Time 4   Period Weeks   Status Partially Met   PT LONG TERM GOAL #4   Title Pt. I with HEP to increase lumbar/ abdominal strength to WNL to improve pain-free functional mobility.     Time 4   Period Weeks   Status Partially Met         Problem List Patient Active Problem List   Diagnosis Date Noted  . Spondylolisthesis of lumbar region 10/29/2014    Pura Spice, PT, DPT # [phone]   01/14/2015, 5:40 PM   [facility] [address] [place], [place], [address] Phone: [phone]   Fax:  [phone]

## 2015-01-15 ENCOUNTER — Ambulatory Visit: Payer: Medicare Other | Admitting: Physical Therapy

## 2015-01-21 ENCOUNTER — Ambulatory Visit: Payer: Medicare Other | Admitting: Physical Therapy

## 2015-01-21 DIAGNOSIS — M6281 Muscle weakness (generalized): Secondary | ICD-10-CM

## 2015-01-21 DIAGNOSIS — M545 Low back pain, unspecified: Secondary | ICD-10-CM

## 2015-01-21 DIAGNOSIS — Z4789 Encounter for other orthopedic aftercare: Secondary | ICD-10-CM | POA: Diagnosis not present

## 2015-01-21 NOTE — Therapy (Signed)
[facility] [address] [place], [place], [address] Phone: [phone]   Fax:  [phone]  Physical Therapy Treatment  Patient Details  Name: Megan Daniels MRN: [id number] Date of Birth: 03-Aug-1951 Referring Provider:  Idelle Crouch, MD  Encounter Date: 01/21/2015      PT End of Session - 01/21/15 1638    Visit Number 12   Number of Visits 18   Date for PT Re-Evaluation 02/05/15   Authorization - Visit Number 12   Authorization - Number of Visits 18   PT Start Time 8786   PT Stop Time 7672   PT Time Calculation (min) 57 min   Activity Tolerance No increased pain;Patient limited by fatigue   Behavior During Therapy [facility] for tasks assessed/performed      Past Medical History  Diagnosis Date  . PONV (postoperative nausea and vomiting)     extreme nausea and vomiting  . Stress incontinence   . Palpitations     takes Metoprolol daily  . Hypertension     takes Losartan daily  . Sarcoidosis   . Asthma     Symbicort as needed  . URI (upper respiratory infection)     treated self with Mucinex and no fever  . Seasonal allergies     takes Claritin daily  . Pneumonia     hx of-early 2000's  . History of bronchitis   . Weakness     numbness and tingling in both legs  . Foot drop     right  . Arthritis   . Joint pain   . Chronic back pain     spondylolisthesis/stenosis  . Vitamin D deficiency   . History of hiatal hernia   . GERD (gastroesophageal reflux disease)     takes Prevacid daily  . Diverticulosis   . History of colon polyps   . Urinary urgency   . Chronic UTI     was treated for 3 months with Macrobid and in Dec everything was fine  . Constipation   . Chalazion of left eye     Past Surgical History  Procedure Laterality Date  . Tonsillectomy    . Abdominal hysterectomy    . Cervical fusion    . Nissan fundoplication    . Bunionectomy Bilateral   . Cataract surgery Bilateral   . Back surgery     . Carpal tunnel release Right     x 2  . Left middle finger surgery      pin  . Colonoscopy    . Esophagogastroduodenoscopy      There were no vitals filed for this visit.  Visit Diagnosis:  Midline low back pain without sciatica  Muscle weakness      Subjective Assessment - 01/21/15 1637    Subjective Pt. continues to do well at work but requires use of back brace with driving/ pt. care.  No pain reported at this time.    Limitations Standing;Walking;Lifting   Patient Stated Goals Increase core strength/ pain-free work-related tasks/ discharge brace   Currently in Pain? No/denies        OBJECTIVE: Nustep L7 10 min. B UE/LE (no charge).Standing core Nautilus: 40# lat. Pull downs/ 30# tricep ext./ 20# chest press/ 30# scap. Retraction 20x each. Seated ball pelvic tilts/ alt. UE and LE/ wt. Shifting/ marching/ LAQ/ 3# biceps/ 3# press-ups/ punches 20x. Resisted gait 2BTB 5x all 4-planes of movement.  Discussed lumbar support with driving/ assessed car.  Pt response for medical necessity: Low back fatigue but no increase c/o pain.          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    Time 4   Period Weeks   Status Partially Met   PT LONG TERM GOAL #2   Title Pt. will decrease Oswestry to <20% to improve pain-free mobility/ return to work.    Baseline currently 30% (14% improvement since initial evaluation).     Time 4   Period Weeks   Status Partially Met   PT LONG TERM GOAL #3   Title Pt. able to tolerate >1 hour of standing activities with no increase c/o back pain.    Time 4   Period  Weeks   Status Partially Met   PT LONG TERM GOAL #4   Title Pt. I with HEP to increase lumbar/ abdominal strength to WNL to improve pain-free functional mobility.     Time 4   Period Weeks   Status Partially Met      Problem List Patient Active Problem List   Diagnosis Date Noted  . Spondylolisthesis of lumbar region 10/29/2014   Pura Spice, PT, DPT # [phone]   01/21/2015, 5:27 PM  [facility] [address] [place], [place], [address] Phone: [phone]   Fax:  [phone]

## 2015-01-28 ENCOUNTER — Ambulatory Visit: Payer: Medicare Other | Admitting: Physical Therapy

## 2015-01-28 DIAGNOSIS — M545 Low back pain, unspecified: Secondary | ICD-10-CM

## 2015-01-28 DIAGNOSIS — M6281 Muscle weakness (generalized): Secondary | ICD-10-CM

## 2015-01-28 DIAGNOSIS — Z4789 Encounter for other orthopedic aftercare: Secondary | ICD-10-CM | POA: Diagnosis not present

## 2015-01-28 NOTE — Therapy (Signed)
[facility] [address] [place], [place], [address] Phone: [phone]   Fax:  [phone]  Physical Therapy Treatment  Patient Details  Name: Megan Daniels MRN: [id number] Date of Birth: 12/01/1950 Referring Provider:  Idelle Crouch, MD  Encounter Date: 01/28/2015      PT End of Session - 01/28/15 1713    Visit Number 13   Number of Visits 18   Date for PT Re-Evaluation 02/05/15   Authorization - Visit Number 13   Authorization - Number of Visits 18   PT Start Time 1628   PT Stop Time 9485   PT Time Calculation (min) 59 min   Activity Tolerance No increased pain;Patient limited by fatigue   Behavior During Therapy [facility] for tasks assessed/performed      Past Medical History  Diagnosis Date  . PONV (postoperative nausea and vomiting)     extreme nausea and vomiting  . Stress incontinence   . Palpitations     takes Metoprolol daily  . Hypertension     takes Losartan daily  . Sarcoidosis   . Asthma     Symbicort as needed  . URI (upper respiratory infection)     treated self with Mucinex and no fever  . Seasonal allergies     takes Claritin daily  . Pneumonia     hx of-early 2000's  . History of bronchitis   . Weakness     numbness and tingling in both legs  . Foot drop     right  . Arthritis   . Joint pain   . Chronic back pain     spondylolisthesis/stenosis  . Vitamin D deficiency   . History of hiatal hernia   . GERD (gastroesophageal reflux disease)     takes Prevacid daily  . Diverticulosis   . History of colon polyps   . Urinary urgency   . Chronic UTI     was treated for 3 months with Macrobid and in Dec everything was fine  . Constipation   . Chalazion of left eye     Past Surgical History  Procedure Laterality Date  . Tonsillectomy    . Abdominal hysterectomy    . Cervical fusion    . Nissan fundoplication    . Bunionectomy Bilateral   . Cataract surgery Bilateral   . Back surgery     . Carpal tunnel release Right     x 2  . Left middle finger surgery      pin  . Colonoscopy    . Esophagogastroduodenoscopy      There were no vitals filed for this visit.  Visit Diagnosis:  Midline low back pain without sciatica  Muscle weakness      Subjective Assessment - 01/28/15 1642    Subjective Pt. reports no back pain but B knees are 3-4/10 at this moment.  Pt. has gone 2 days in a row with no back brace and no limitations.     Limitations Standing;Walking;Lifting   Patient Stated Goals Increase core strength/ pain-free work-related tasks/ discharge brace   Currently in Pain? Yes   Pain Score 3    Pain Location Knee   Pain Orientation Right;Left   Pain Relieving Factors Pt. states Aleve helps but can't because of chronic kidney issues.         OBJECTIVE: Nustep L7 10 min. B UE/LE (no charge/ warm-up).  10# box lifting/ rotations 10x/ 30# box B carrying with core muscle  contraction 50 feet x 2.Standing core Nautilus: 40# lat. Pull downs/ 30# tricep ext./ 20# shoulder shrugs/ 40# scap. Retraction 20x each. Seated ball pelvic tilts/ alt. UE and LE/ wt. Shifting/ marching/ LAQ/ scap. retraction 20x. Standing rotn. With 3# ball 10x each L/R (maintaining core muscle contraction).   Sled push/pull 100# 45 feet x 2 in hallway (good posture/ extra time required).  Discussed [employer]/ [employer] ex. Classes.   Pt response for medical necessity: Low back fatigue but no increase c/o pain.          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    Time 4   Period Weeks   Status Partially Met   PT LONG TERM GOAL #2   Title Pt. will decrease Oswestry to <20% to improve pain-free mobility/ return to work.    Baseline currently 30% (14% improvement since initial evaluation).     Time 4   Period Weeks   Status Partially Met    PT LONG TERM GOAL #3   Title Pt. able to tolerate >1 hour of standing activities with no increase c/o back pain.    Time 4   Period Weeks   Status Partially Met   PT LONG TERM GOAL #4   Title Pt. I with HEP to increase lumbar/ abdominal strength to WNL to improve pain-free functional mobility.     Time 4   Period Weeks   Status Partially Met               Plan - 01/28/15 1727    Clinical Impression Statement Good posture t/o tx. session.  Pt. verbal/demonstrated proper technique of HEP/ supine.  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    Pt will benefit from skilled therapeutic intervention in order to improve on the following deficits Decreased endurance;Improper body mechanics;Impaired flexibility;Decreased strength;Decreased mobility;Pain;Decreased range of motion   Rehab Potential Good   PT Frequency 2x / week   PT Duration 4 weeks   PT Treatment/Interventions Gait training;Neuromuscular re-education;Stair training;Functional mobility training;Patient/family education;Therapeutic activities;Cryotherapy;Manual techniques;Therapeutic exercise;Balance training   PT Next Visit Plan Progress core stability/ decrease use of brace with everyday tasks.    PT Home Exercise Plan TrA stability ex. program/ posture resisted ex.   Probable discharge next tx.        Problem List Patient Active Problem List   Diagnosis Date Noted  . Spondylolisthesis of lumbar region 10/29/2014    Pura Spice, PT, DPT # [phone]   01/28/2015, 5:30 PM  [facility] [address] [place], [place], [address] Phone: [phone]   Fax:  [phone]

## 2015-02-04 ENCOUNTER — Ambulatory Visit: Payer: Medicare Other | Attending: Physical Medicine and Rehabilitation

## 2015-02-04 ENCOUNTER — Encounter: Payer: Self-pay | Admitting: Physical Therapy

## 2015-02-04 DIAGNOSIS — M6281 Muscle weakness (generalized): Secondary | ICD-10-CM | POA: Diagnosis present

## 2015-02-04 DIAGNOSIS — M545 Low back pain, unspecified: Secondary | ICD-10-CM

## 2015-02-05 NOTE — Therapy (Signed)
[facility] [address] [place], [place], [address] Phone: [phone]   Fax:  [phone]  Physical Therapy Treatment  Patient Details  Name: Megan Daniels MRN: [id number] Date of Birth: 1951/04/20 Referring Provider:  Newman Pies, MD  Encounter Date: 02/04/2015      PT End of Session - 02/05/15 1542    Visit Number 14   Number of Visits 18   Date for PT Re-Evaluation 02/05/15   Authorization - Visit Number 14   Authorization - Number of Visits 18   PT Start Time 3149   PT Stop Time 7026   PT Time Calculation (min) 45 min   Activity Tolerance No increased pain;Patient tolerated treatment well   Behavior During Therapy [facility] for tasks assessed/performed;Anxious  Slightly anxious about discharge      Past Medical History  Diagnosis Date  . PONV (postoperative nausea and vomiting)     extreme nausea and vomiting  . Stress incontinence   . Palpitations     takes Metoprolol daily  . Hypertension     takes Losartan daily  . Sarcoidosis   . Asthma     Symbicort as needed  . URI (upper respiratory infection)     treated self with Mucinex and no fever  . Seasonal allergies     takes Claritin daily  . Pneumonia     hx of-early 2000's  . History of bronchitis   . Weakness     numbness and tingling in both legs  . Foot drop     right  . Arthritis   . Joint pain   . Chronic back pain     spondylolisthesis/stenosis  . Vitamin D deficiency   . History of hiatal hernia   . GERD (gastroesophageal reflux disease)     takes Prevacid daily  . Diverticulosis   . History of colon polyps   . Urinary urgency   . Chronic UTI     was treated for 3 months with Macrobid and in Dec everything was fine  . Constipation   . Chalazion of left eye     Past Surgical History  Procedure Laterality Date  . Tonsillectomy    . Abdominal hysterectomy    . Cervical fusion    . Nissan fundoplication    . Bunionectomy Bilateral    . Cataract surgery Bilateral   . Back surgery    . Carpal tunnel release Right     x 2  . Left middle finger surgery      pin  . Colonoscopy    . Esophagogastroduodenoscopy      There were no vitals filed for this visit.  Visit Diagnosis:  Midline low back pain without sciatica  Muscle weakness      Subjective Assessment - 02/04/15 1633    Subjective Pt reports some "soreness" in her low back after the last therapy session which she still feels slightly today. She is performing HEP and has no questions or concerns currently.    Patient Stated Goals Increase core strength/ pain-free work-related tasks/ discharge brace   Currently in Pain? Yes   Pain Score 1    Pain Location Back   Pain Orientation Lower;Other (Comment)  center   Pain Descriptors / Indicators Aching      OBJECTIVE:  Extensive education provided with patient regarding proper lifting and bending techniques as well as mechanics for a variety of tasks. Performed golfer pick-up with neutral spine as well as rotation  with pelvis on femur instead of spine on pelvis to decrease force through low back. Reviewed HEP with patient and discussed transition to local gym. Goals updated with patient and provided reassurance for independence following discharge. Nautilus exercises: 30# rows 2 x 10; 30# chest press 2 x 10; 30# lat pull down 2 x 10; 30# tricep press 2 x 10;                           PT Education - 02/05/15 1542    Education provided Yes   Education Details Lifting and bending techniques for neutral spine, abdominal bracing during lifts, body mechanics, HEP   Person(s) Educated Patient   Methods Explanation   Comprehension Verbalized understanding             PT Long Term Goals - 02/04/15 1650    PT LONG TERM GOAL #1   Title (p) Pt. able to return to work as [employer]'s [employer] with proper lifting/ body mechanics and no low back pain.    Baseline (p) 02/04/15 most days no pain.   Time (p) 4    Period (p) Weeks   Status (p) Achieved   PT LONG TERM GOAL #2   Title (p) Pt. will decrease Oswestry to <20% to improve pain-free mobility/ return to work.    Baseline (p) 02/04/15: 24% (44% initial evaluation, 30% 3 weeks ago)   Time (p) 4   Period (p) Weeks   Status (p) Partially Met   PT LONG TERM GOAL #3   Title (p) Pt. able to tolerate >1 hour of standing activities with no increase c/o back pain.    Baseline (p) 02/04/15: pt able to stand approximately 30 minutes    Time (p) 4   Period (p) Weeks   Status (p) Partially Met   PT LONG TERM GOAL #4   Title (p) Pt. I with HEP to increase lumbar/ abdominal strength to WNL to improve pain-free functional mobility.     Time (p) 4   Period (p) Weeks   Status (p) Achieved               Plan - 02/05/15 1543    Clinical Impression Statement Pt will be discharged after today's visit. She has met 2 of her 4 goals and is pleased with her current level of function. She scored a 24% on the modified ODI today and her goals was below 20%. This score has improved from 44% at her initial evaluation. In addition she is currently only able to stand for approximately 30 minutes prior to an increase in back pain (goal was 1 hour).  Pt demonstrates good body mechanics during all exercises. She is able to verbalize proper techniques for patient transfers and lifts. Pt has plans to join a local gym with a pool to utilize for continuation of her home exercise program. She is currently independent with all of her exercises and has returned to work without need for a back brace. Discussed indications for appropriate use of back brace in the future. Pt was advised to follow-up with her surgeon as needed and call therapist with further questions/concerns.    PT Next Visit Plan Discharge today   PT Home Exercise Plan TrA stability ex. program/ posture resisted ex., encouraged to join a local gym and engage in aquatic exercise   Consulted and Agree with Plan of  Care Patient          G-Codes -  02/05/15 1551    Functional Assessment Tool Used clinical judgement/ Oswestry/ pain   Functional Limitation Changing and maintaining body position   Changing and Maintaining Body Position Goal Status [id number]) At least 1 percent but less than 20 percent impaired, limited or restricted   Changing and Maintaining Body Position Discharge Status (Y2903) At least 1 percent but less than 20 percent impaired, limited or restricted      Problem List Patient Active Problem List   Diagnosis Date Noted  . Spondylolisthesis of lumbar region 10/29/2014    PHYSICAL THERAPY DISCHARGE SUMMARY  Visits from Start of Care: 14  Current functional level related to goals / functional outcomes: See above   Remaining deficits: Modified ODI: 24%, pain-free standing 30 minutes   Education / Equipment: Back brace discontinued, discussed indication for future use  Plan: Patient agrees to discharge.  Patient goals were partially met. Patient is being discharged due to meeting the stated rehab goals. as well as being pleased with current functional level  ?????       Thank you for this referral.   Phillips Grout PT, DPT   Nandini Bogdanski 02/05/2015, 3:57 PM  [facility] [address] [place], [place], [address] Phone: [phone]   Fax:  [phone]

## 2015-02-25 ENCOUNTER — Ambulatory Visit: Payer: Medicare Other | Admitting: Physical Therapy

## 2015-02-25 DIAGNOSIS — M6281 Muscle weakness (generalized): Secondary | ICD-10-CM

## 2015-02-25 DIAGNOSIS — M545 Low back pain, unspecified: Secondary | ICD-10-CM

## 2015-02-25 NOTE — Therapy (Signed)
[facility] [address] [place], [place], [address] Phone: [phone]   Fax:  [phone]  Physical Therapy Treatment  Patient Details  Name: Megan Daniels MRN: [id number] Date of Birth: 1951/04/06 Referring Provider:  Idelle Crouch, MD  Encounter Date: 02/25/2015    Past Medical History  Diagnosis Date  . PONV (postoperative nausea and vomiting)     extreme nausea and vomiting  . Stress incontinence   . Palpitations     takes Metoprolol daily  . Hypertension     takes Losartan daily  . Sarcoidosis   . Asthma     Symbicort as needed  . URI (upper respiratory infection)     treated self with Mucinex and no fever  . Seasonal allergies     takes Claritin daily  . Pneumonia     hx of-early 2000's  . History of bronchitis   . Weakness     numbness and tingling in both legs  . Foot drop     right  . Arthritis   . Joint pain   . Chronic back pain     spondylolisthesis/stenosis  . Vitamin D deficiency   . History of hiatal hernia   . GERD (gastroesophageal reflux disease)     takes Prevacid daily  . Diverticulosis   . History of colon polyps   . Urinary urgency   . Chronic UTI     was treated for 3 months with Macrobid and in Dec everything was fine  . Constipation   . Chalazion of left eye     Past Surgical History  Procedure Laterality Date  . Tonsillectomy    . Abdominal hysterectomy    . Cervical fusion    . Nissan fundoplication    . Bunionectomy Bilateral   . Cataract surgery Bilateral   . Back surgery    . Carpal tunnel release Right     x 2  . Left middle finger surgery      pin  . Colonoscopy    . Esophagogastroduodenoscopy      There were no vitals filed for this visit.  Visit Diagnosis:  Midline low back pain without sciatica  Muscle weakness      Subjective Assessment - 02/25/15 1123    Subjective Pt. called PT yesterday to report a recent set back/ flare-up of LBP with  radicular symptoms after prolonged standing during cooking at home/ church.  Pt. states her pain became 10/10 at was hurting for several days.  Pt. contacted MD and they instructed pt. to call back in 2 weeks if pain persisted.  Pt. arrived to PT today to discuss proper stretches/ treatment to improve soreness/ pain.    Limitations Standing;Walking;Lifting   Patient Stated Goals Increase core strength/ pain-free work-related tasks/ discharge brace   Currently in Pain? Yes   Pain Score 3    Pain Location Back   Pain Orientation Lower   Pain Type Chronic pain       Discussed cause of new LBP/ symptoms and discussed proper stretches/ icing.  Pt. Instructed to return to HEP and consider pool ex. Program.         PT Education - 02/25/15 1134    Education provided Yes   Education Details Stretches/ HEP/ icing   Person(s) Educated Patient   Methods Explanation;Demonstration;Handout   Comprehension Verbalized understanding             PT Long Term Goals - 02/04/15 1650  PT LONG TERM GOAL #1   Title (p) Pt. able to return to work as [employer]'s [employer] with proper lifting/ body mechanics and no low back pain.    Baseline (p) 02/04/15 most days no pain.   Time (p) 4   Period (p) Weeks   Status (p) Achieved   PT LONG TERM GOAL #2   Title (p) Pt. will decrease Oswestry to <20% to improve pain-free mobility/ return to work.    Baseline (p) 02/04/15: 24% (44% initial evaluation, 30% 3 weeks ago)   Time (p) 4   Period (p) Weeks   Status (p) Partially Met   PT LONG TERM GOAL #3   Title (p) Pt. able to tolerate >1 hour of standing activities with no increase c/o back pain.    Baseline (p) 02/04/15: pt able to stand approximately 30 minutes    Time (p) 4   Period (p) Weeks   Status (p) Partially Met   PT LONG TERM GOAL #4   Title (p) Pt. I with HEP to increase lumbar/ abdominal strength to WNL to improve pain-free functional mobility.     Time (p) 4   Period (p) Weeks   Status (p) Achieved                Plan - 02/25/15 1134    Clinical Impression Statement Pt. issued gentle stretches and instructed on icing/ proper sitting posture/ body mechanics.  Pt. instructed to contact PT over next week if worsen LBP or no improvement.  Pt. may benefit from additional PT after MD f/u is symptoms persist.    Pt will benefit from skilled therapeutic intervention in order to improve on the following deficits Decreased endurance;Improper body mechanics;Impaired flexibility;Decreased strength;Decreased mobility;Pain;Decreased range of motion        Problem List Patient Active Problem List   Diagnosis Date Noted  . Spondylolisthesis of lumbar region 10/29/2014    Pura Spice, PT, DPT # [phone]   02/25/2015, 11:36 AM  [facility] [address] [place], [place], [address] Phone: [phone]   Fax:  [phone]

## 2015-03-13 ENCOUNTER — Other Ambulatory Visit: Payer: Self-pay | Admitting: Neurosurgery

## 2015-03-13 DIAGNOSIS — G8929 Other chronic pain: Secondary | ICD-10-CM

## 2015-03-13 DIAGNOSIS — M545 Low back pain, unspecified: Secondary | ICD-10-CM

## 2015-03-18 ENCOUNTER — Ambulatory Visit
Admission: RE | Admit: 2015-03-18 | Discharge: 2015-03-18 | Disposition: A | Discharge status: Home / Self Care | Payer: Medicare Other | Source: Ambulatory Visit | Attending: Neurosurgery | Admitting: Neurosurgery

## 2015-03-18 ENCOUNTER — Ambulatory Visit: Payer: Medicare Other

## 2015-03-18 DIAGNOSIS — G8929 Other chronic pain: Secondary | ICD-10-CM

## 2015-03-18 DIAGNOSIS — M4316 Spondylolisthesis, lumbar region: Secondary | ICD-10-CM | POA: Insufficient documentation

## 2015-03-18 DIAGNOSIS — M545 Low back pain: Secondary | ICD-10-CM

## 2015-03-18 MED ORDER — GADOBENATE DIMEGLUMINE 529 MG/ML IV SOLN
10.0000 mL | Freq: Once | INTRAVENOUS | Status: AC | PRN
  Administered 2015-03-18: 9 mL via INTRAVENOUS

## 2015-03-25 ENCOUNTER — Other Ambulatory Visit: Payer: Self-pay | Admitting: Neurosurgery

## 2015-03-25 DIAGNOSIS — M5416 Radiculopathy, lumbar region: Secondary | ICD-10-CM

## 2015-03-27 ENCOUNTER — Other Ambulatory Visit: Payer: Self-pay | Admitting: Internal Medicine

## 2015-03-27 DIAGNOSIS — Z1231 Encounter for screening mammogram for malignant neoplasm of breast: Secondary | ICD-10-CM

## 2015-04-03 ENCOUNTER — Ambulatory Visit
Admission: RE | Admit: 2015-04-03 | Discharge: 2015-04-03 | Disposition: A | Discharge status: Home / Self Care | Payer: Medicare Other | Source: Ambulatory Visit | Attending: Neurosurgery | Admitting: Neurosurgery

## 2015-04-03 VITALS — BP 135/75 | HR 74

## 2015-04-03 DIAGNOSIS — M4316 Spondylolisthesis, lumbar region: Secondary | ICD-10-CM

## 2015-04-03 DIAGNOSIS — M5416 Radiculopathy, lumbar region: Secondary | ICD-10-CM

## 2015-04-03 MED ORDER — DIAZEPAM 5 MG PO TABS
5.0000 mg | ORAL_TABLET | Freq: Once | ORAL | Status: AC
  Administered 2015-04-03: 5 mg via ORAL

## 2015-04-03 MED ORDER — ONDANSETRON HCL 4 MG/2ML IJ SOLN
4.0000 mg | Freq: Once | INTRAMUSCULAR | Status: AC
Start: 2015-04-03 — End: 2015-04-03
  Administered 2015-04-03: 4 mg via INTRAMUSCULAR

## 2015-04-03 MED ORDER — MEPERIDINE HCL 100 MG/ML IJ SOLN
75.0000 mg | Freq: Once | INTRAMUSCULAR | Status: AC
  Administered 2015-04-03: 75 mg via INTRAMUSCULAR

## 2015-04-03 MED ORDER — IOHEXOL 180 MG/ML  SOLN
15.0000 mL | Freq: Once | INTRAMUSCULAR | Status: AC | PRN
  Administered 2015-04-03: 15 mL via INTRATHECAL

## 2015-04-03 NOTE — Discharge Instructions (Signed)
Myelogram Discharge Instructions  1. Go home and rest quietly for the next 24 hours.  It is important to lie flat for the next 24 hours.  Get up only to go to the restroom.  You may lie in the bed or on a couch on your back, your stomach, your left side or your right side.  You may have one pillow under your head.  You may have pillows between your knees while you are on your side or under your knees while you are on your back.  2. DO NOT drive today.  Recline the seat as far back as it will go, while still wearing your seat belt, on the way home.  3. You may get up to go to the bathroom as needed.  You may sit up for 10 minutes to eat.  You may resume your normal diet and medications unless otherwise indicated.  Drink lots of extra fluids today and tomorrow.  4. The incidence of headache, nausea, or vomiting is about 5% (one in 20 patients).  If you develop a headache, lie flat and drink plenty of fluids until the headache goes away.  Caffeinated beverages may be helpful.  If you develop severe nausea and vomiting or a headache that does not go away with flat bed rest, call [phone].  5. You may resume normal activities after your 24 hours of bed rest is over; however, do not exert yourself strongly or do any heavy lifting tomorrow. If when you get up you have a headache when standing, go back to bed and force fluids for another 24 hours.  6. Call your physician for a follow-up appointment.  The results of your myelogram will be sent directly to your physician by the following day.  7. If you have any questions or if complications develop after you arrive home, please call [phone].  Discharge instructions have been explained to the patient.  The patient, or the person responsible for the patient, fully understands these instructions.       May resume Tramadol on April 04, 2015, after 1:00 pm.

## 2015-04-03 NOTE — Progress Notes (Signed)
Patient states she has been off Tramadol for at least the past days.

## 2015-04-22 ENCOUNTER — Other Ambulatory Visit: Payer: Self-pay | Admitting: Neurosurgery

## 2015-04-29 ENCOUNTER — Encounter (HOSPITAL_COMMUNITY): Payer: Self-pay

## 2015-04-29 ENCOUNTER — Emergency Department
Admission: EM | Admit: 2015-04-29 | Discharge: 2015-04-29 | Disposition: A | Discharge status: Home / Self Care | Payer: No Typology Code available for payment source | Attending: Emergency Medicine | Admitting: Emergency Medicine

## 2015-04-29 ENCOUNTER — Encounter (HOSPITAL_COMMUNITY)
Admission: RE | Admit: 2015-04-29 | Discharge: 2015-04-29 | Disposition: A | Discharge status: Home / Self Care | Payer: Medicare Other | Source: Ambulatory Visit | Attending: Neurosurgery | Admitting: Neurosurgery

## 2015-04-29 DIAGNOSIS — I10 Essential (primary) hypertension: Secondary | ICD-10-CM | POA: Insufficient documentation

## 2015-04-29 DIAGNOSIS — Y92410 Unspecified street and highway as the place of occurrence of the external cause: Secondary | ICD-10-CM | POA: Diagnosis not present

## 2015-04-29 DIAGNOSIS — Y998 Other external cause status: Secondary | ICD-10-CM | POA: Insufficient documentation

## 2015-04-29 DIAGNOSIS — Z7982 Long term (current) use of aspirin: Secondary | ICD-10-CM | POA: Insufficient documentation

## 2015-04-29 DIAGNOSIS — M4806 Spinal stenosis, lumbar region: Secondary | ICD-10-CM | POA: Diagnosis not present

## 2015-04-29 DIAGNOSIS — K219 Gastro-esophageal reflux disease without esophagitis: Secondary | ICD-10-CM | POA: Diagnosis not present

## 2015-04-29 DIAGNOSIS — Z79899 Other long term (current) drug therapy: Secondary | ICD-10-CM | POA: Insufficient documentation

## 2015-04-29 DIAGNOSIS — S39012A Strain of muscle, fascia and tendon of lower back, initial encounter: Secondary | ICD-10-CM | POA: Diagnosis not present

## 2015-04-29 DIAGNOSIS — D869 Sarcoidosis, unspecified: Secondary | ICD-10-CM | POA: Insufficient documentation

## 2015-04-29 DIAGNOSIS — J45909 Unspecified asthma, uncomplicated: Secondary | ICD-10-CM | POA: Insufficient documentation

## 2015-04-29 DIAGNOSIS — Z01812 Encounter for preprocedural laboratory examination: Secondary | ICD-10-CM | POA: Diagnosis not present

## 2015-04-29 DIAGNOSIS — Y9389 Activity, other specified: Secondary | ICD-10-CM | POA: Insufficient documentation

## 2015-04-29 DIAGNOSIS — Z01818 Encounter for other preprocedural examination: Secondary | ICD-10-CM | POA: Insufficient documentation

## 2015-04-29 DIAGNOSIS — S3992XA Unspecified injury of lower back, initial encounter: Secondary | ICD-10-CM | POA: Diagnosis present

## 2015-04-29 HISTORY — DX: Other specified abnormal findings of blood chemistry: R79.89

## 2015-04-29 HISTORY — DX: Sleep apnea, unspecified: G47.30

## 2015-04-29 HISTORY — DX: Malignant (primary) neoplasm, unspecified: C80.1

## 2015-04-29 HISTORY — DX: Chronic obstructive pulmonary disease, unspecified: J44.9

## 2015-04-29 HISTORY — DX: Reserved for inherently not codable concepts without codable children: IMO0001

## 2015-04-29 LAB — CBC
HCT: 37.8 % (ref 36.0–46.0)
Hemoglobin: 12.8 g/dL (ref 12.0–15.0)
MCH: 30.3 pg (ref 26.0–34.0)
MCHC: 33.9 g/dL (ref 30.0–36.0)
MCV: 89.6 fL (ref 78.0–100.0)
Platelets: 182 10*3/uL (ref 150–400)
RBC: 4.22 MIL/uL (ref 3.87–5.11)
RDW: 14.3 % (ref 11.5–15.5)
WBC: 5.1 10*3/uL (ref 4.0–10.5)

## 2015-04-29 LAB — BASIC METABOLIC PANEL
ANION GAP: 10 (ref 5–15)
BUN: 15 mg/dL (ref 6–20)
CO2: 24 mmol/L (ref 22–32)
Calcium: 9.3 mg/dL (ref 8.9–10.3)
Chloride: 104 mmol/L (ref 101–111)
Creatinine, Ser: 1.34 mg/dL — ABNORMAL HIGH (ref 0.44–1.00)
GFR calc Af Amer: 47 mL/min — ABNORMAL LOW (ref >60)
GFR, EST NON AFRICAN AMERICAN: 41 mL/min — AB (ref >60)
Glucose, Bld: 131 mg/dL — ABNORMAL HIGH (ref 65–99)
Potassium: 3.7 mmol/L (ref 3.5–5.1)
SODIUM: 138 mmol/L (ref 135–145)

## 2015-04-29 LAB — SURGICAL PCR SCREEN
MRSA, PCR: NEGATIVE
STAPHYLOCOCCUS AUREUS: NEGATIVE
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
## 2015-04-29 NOTE — Pre-Procedure Instructions (Addendum)
Megan Daniels  04/29/2015      [facility] [address] [place] [address] Phone: [phone] Fax: [phone]    Your procedure is scheduled on 05/04/15.  Report to [facility] short stay admitting at 1200 pm .  Call this number if you have problems the morning of surgery:  [phone]   Remember:  Do not eat food or drink liquids after midnight.  Take these medicines the morning of surgery with A SIP OF WATER inhaler(bring with you), valium if needed, gabapentin, dilaudid, tramadol if needed,prevacid   STOP all herbel meds, nsaids (aleve,naproxen,advil,ibuprofen) starting now including all vitamins( C,multi,caltrate,D),aspirin    Do not wear jewelry, make-up or nail polish.  Do not wear lotions, powders, or perfumes.  You may wear deodorant.  Do not shave 48 hours prior to surgery.  Men may shave face and neck.  Do not bring valuables to the hospital.  [facility] is not responsible for any belongings or valuables.  Contacts, dentures or bridgework may not be worn into surgery.  Leave your suitcase in the car.  After surgery it may be brought to your room.  For patients admitted to the hospital, discharge time will be determined by your treatment team.  Patients discharged the day of surgery will not be allowed to drive home.   Name and phone number of your driver:    Special instructions:   Special Instructions: [place] - Preparing for Surgery  Before surgery, you can play an important role.  Because skin is not sterile, your skin needs to be as free of germs as possible.  You can reduce the number of germs on you skin by washing with CHG (chlorahexidine gluconate) soap before surgery.  CHG is an antiseptic cleaner which kills germs and bonds with the skin to continue killing germs even after washing.  Please DO NOT use if you have an allergy to CHG or antibacterial soaps.  If your skin becomes  reddened/irritated stop using the CHG and inform your nurse when you arrive at Short Stay.  Do not shave (including legs and underarms) for at least 48 hours prior to the first CHG shower.  You may shave your face.  Please follow these instructions carefully:   1.  Shower with CHG Soap the night before surgery and the morning of Surgery.  2.  If you choose to wash your hair, wash your hair first as usual with your normal shampoo.  3.  After you shampoo, rinse your hair and body thoroughly to remove the Shampoo.  4.  Use CHG as you would any other liquid soap.  You can apply chg directly  to the skin and wash gently with scrungie or a clean washcloth.  5.  Apply the CHG Soap to your body ONLY FROM THE NECK DOWN.  Do not use on open wounds or open sores.  Avoid contact with your eyes ears, mouth and genitals (private parts).  Wash genitals (private parts)       with your normal soap.  6.  Wash thoroughly, paying special attention to the area where your surgery will be performed.  7.  Thoroughly rinse your body with warm water from the neck down.  8.  DO NOT shower/wash with your normal soap after using and rinsing off the CHG Soap.  9.  Pat yourself dry with a clean towel.  10.  Wear clean pajamas.            11.  Place clean sheets on your bed the night of your first shower and do not sleep with pets.  Day of Surgery  Do not apply any lotions/deodorants the morning of surgery.  Please wear clean clothes to the hospital/surgery center.  Please read over the following fact sheets that you were given. Pain Booklet, Coughing and Deep Breathing, MRSA Information and Surgical Site Infection Prevention
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
## 2015-04-30 NOTE — Progress Notes (Signed)
Anesthesia Chart Review:  Pt is 64 year old female scheduled for R L2-3, L3-4 lumbar laminectomy and foraminotomy on 05/04/2015 with Dr. Arnoldo Morale.   PCP is Dr. Fulton Reek with [facility] (see Care Everywhere).  PMH includes: S/p L4-5 laminectomy with PLIF 10/29/14.   History includes palpitations, HTN, Sarcoidosis, asthma, foot drop, GERD, hiatal hernia, Nissan fundoplication, diverticulosis, back and neck surgeries, "EXTREME" POST-OP N/V (none last surgery 10/29/14). Never smoker. BMI 35. PRN pulmonology follow-up recommended by Dr. York Spaniel according to 02/15/13 note in Care Everywhere.   Medications include: albuterol, ASA, prevacid, losartan, metoprolol.   Preoperative labs reviewed.    EKG 04/29/2015: NSR. Possible LAE.   07/02/14 Echo (Care Everywhere): -Normal LV systolic function, EF 18% -Normal RV systolic function -Mild tricuspid and mitral valve insufficiency -no valvular stenosis  Nuclear stress test 03/21/2013 (found in correspondence dated 11/02/14 in media tab):  -Normal treadmill ECG without evidence of ischemia or dysrhythmia.  -Excellent exercise tolerance for age. Normal myocardial perfusion images at rest and peak stress without evidence of ischemia.  -Normal LV wall motion and LVEF 55%.  02/15/13 Spirometry (Care Everywhere): Interpretation: Normal. FVC 2.67 (86%). FEV1 2.31 (97%).   Pt tolerated similar surgery back in February without issue. If no changes, I anticipate pt can proceed with surgery as scheduled.   Willeen Cass, FNP-BC [facility] Short Stay Surgical Center/Anesthesiology Phone: [phone] 04/30/2015 12:20 PM

## 2015-05-04 ENCOUNTER — Inpatient Hospital Stay (HOSPITAL_COMMUNITY): Payer: Medicare Other | Admitting: Emergency Medicine

## 2015-05-04 ENCOUNTER — Encounter (HOSPITAL_COMMUNITY)
Admission: RE | Disposition: A | Discharge status: Home Health | Payer: Self-pay | Source: Ambulatory Visit | Attending: Neurosurgery

## 2015-05-04 ENCOUNTER — Inpatient Hospital Stay (HOSPITAL_COMMUNITY): Payer: Medicare Other | Admitting: Certified Registered"

## 2015-05-04 ENCOUNTER — Encounter (HOSPITAL_COMMUNITY): Payer: Self-pay | Admitting: Certified Registered"

## 2015-05-04 ENCOUNTER — Inpatient Hospital Stay (HOSPITAL_COMMUNITY): Payer: Medicare Other

## 2015-05-04 ENCOUNTER — Inpatient Hospital Stay (HOSPITAL_COMMUNITY)
Admission: RE | Admit: 2015-05-04 | Discharge: 2015-05-06 | DRG: 517 | Disposition: A | Discharge status: Home Health | Payer: Medicare Other | Source: Ambulatory Visit | Attending: Neurosurgery | Admitting: Neurosurgery

## 2015-05-04 DIAGNOSIS — M5416 Radiculopathy, lumbar region: Secondary | ICD-10-CM | POA: Diagnosis present

## 2015-05-04 DIAGNOSIS — J45909 Unspecified asthma, uncomplicated: Secondary | ICD-10-CM | POA: Diagnosis present

## 2015-05-04 DIAGNOSIS — G4730 Sleep apnea, unspecified: Secondary | ICD-10-CM | POA: Diagnosis present

## 2015-05-04 DIAGNOSIS — Z79899 Other long term (current) drug therapy: Secondary | ICD-10-CM

## 2015-05-04 DIAGNOSIS — Z90710 Acquired absence of both cervix and uterus: Secondary | ICD-10-CM

## 2015-05-04 DIAGNOSIS — D869 Sarcoidosis, unspecified: Secondary | ICD-10-CM | POA: Diagnosis present

## 2015-05-04 DIAGNOSIS — M1990 Unspecified osteoarthritis, unspecified site: Secondary | ICD-10-CM | POA: Diagnosis present

## 2015-05-04 DIAGNOSIS — M4806 Spinal stenosis, lumbar region: Principal | ICD-10-CM | POA: Diagnosis present

## 2015-05-04 DIAGNOSIS — M48061 Spinal stenosis, lumbar region without neurogenic claudication: Secondary | ICD-10-CM | POA: Diagnosis present

## 2015-05-04 DIAGNOSIS — I10 Essential (primary) hypertension: Secondary | ICD-10-CM | POA: Diagnosis present

## 2015-05-04 DIAGNOSIS — Z7982 Long term (current) use of aspirin: Secondary | ICD-10-CM | POA: Diagnosis not present

## 2015-05-04 DIAGNOSIS — M549 Dorsalgia, unspecified: Secondary | ICD-10-CM | POA: Diagnosis present

## 2015-05-04 DIAGNOSIS — Z881 Allergy status to other antibiotic agents status: Secondary | ICD-10-CM | POA: Diagnosis not present

## 2015-05-04 DIAGNOSIS — K219 Gastro-esophageal reflux disease without esophagitis: Secondary | ICD-10-CM | POA: Diagnosis present

## 2015-05-04 DIAGNOSIS — Z885 Allergy status to narcotic agent status: Secondary | ICD-10-CM

## 2015-05-04 DIAGNOSIS — Z888 Allergy status to other drugs, medicaments and biological substances status: Secondary | ICD-10-CM | POA: Diagnosis not present

## 2015-05-04 DIAGNOSIS — J449 Chronic obstructive pulmonary disease, unspecified: Secondary | ICD-10-CM | POA: Diagnosis present

## 2015-05-04 DIAGNOSIS — Z419 Encounter for procedure for purposes other than remedying health state, unspecified: Secondary | ICD-10-CM

## 2015-05-04 HISTORY — PX: LUMBAR LAMINECTOMY/DECOMPRESSION MICRODISCECTOMY: SHX5026

## 2015-05-04 SURGERY — LUMBAR LAMINECTOMY/DECOMPRESSION MICRODISCECTOMY 2 LEVELS
Anesthesia: General | Site: Back | Laterality: Right

## 2015-05-04 MED ORDER — ROCURONIUM BROMIDE 100 MG/10ML IV SOLN
INTRAVENOUS | Status: DC | PRN
  Administered 2015-05-04: 40 mg via INTRAVENOUS
  Administered 2015-05-04: 10 mg via INTRAVENOUS

## 2015-05-04 MED ORDER — ARTIFICIAL TEARS OP OINT
TOPICAL_OINTMENT | OPHTHALMIC | Status: AC
  Filled 2015-05-04: qty 3.5

## 2015-05-04 MED ORDER — PANTOPRAZOLE SODIUM 20 MG PO TBEC
20.0000 mg | DELAYED_RELEASE_TABLET | Freq: Every evening | ORAL | Status: DC
  Administered 2015-05-05: 20 mg via ORAL
  Filled 2015-05-04 (×2): qty 1

## 2015-05-04 MED ORDER — LOSARTAN POTASSIUM 50 MG PO TABS: 100.0000 mg | ORAL_TABLET | Freq: Every evening | ORAL | Status: DC

## 2015-05-04 MED ORDER — MIDAZOLAM HCL 2 MG/2ML IJ SOLN
INTRAMUSCULAR | Status: AC
Start: 2015-05-04 — End: 2015-05-04
  Filled 2015-05-04: qty 4

## 2015-05-04 MED ORDER — FENTANYL CITRATE (PF) 100 MCG/2ML IJ SOLN
INTRAMUSCULAR | Status: AC
  Administered 2015-05-04: 25 ug via INTRAVENOUS
  Filled 2015-05-04: qty 2

## 2015-05-04 MED ORDER — VANCOMYCIN HCL IN DEXTROSE 1-5 GM/200ML-% IV SOLN
INTRAVENOUS | Status: AC
  Filled 2015-05-04: qty 200

## 2015-05-04 MED ORDER — ONDANSETRON HCL 4 MG/2ML IJ SOLN
INTRAMUSCULAR | Status: DC | PRN
  Administered 2015-05-04: 4 mg via INTRAVENOUS

## 2015-05-04 MED ORDER — ALUM & MAG HYDROXIDE-SIMETH 200-200-20 MG/5ML PO SUSP
30.0000 mL | Freq: Four times a day (QID) | ORAL | Status: DC | PRN
  Administered 2015-05-06: 30 mL via ORAL
  Filled 2015-05-04: qty 30

## 2015-05-04 MED ORDER — NEOSTIGMINE METHYLSULFATE 10 MG/10ML IV SOLN
INTRAVENOUS | Status: DC | PRN
  Administered 2015-05-04: 3 mg via INTRAVENOUS

## 2015-05-04 MED ORDER — ONDANSETRON HCL 4 MG/2ML IJ SOLN
INTRAMUSCULAR | Status: AC
  Filled 2015-05-04: qty 2

## 2015-05-04 MED ORDER — DEXAMETHASONE SODIUM PHOSPHATE 4 MG/ML IJ SOLN
INTRAMUSCULAR | Status: DC | PRN
  Administered 2015-05-04: 4 mg via INTRAVENOUS

## 2015-05-04 MED ORDER — LIDOCAINE HCL (CARDIAC) 20 MG/ML IV SOLN
INTRAVENOUS | Status: DC | PRN
  Administered 2015-05-04: 70 mg via INTRAVENOUS

## 2015-05-04 MED ORDER — ADULT MULTIVITAMIN W/MINERALS CH
1.0000 | ORAL_TABLET | Freq: Every evening | ORAL | Status: DC
  Administered 2015-05-04 – 2015-05-05 (×2): 1 via ORAL
  Filled 2015-05-04 (×3): qty 1

## 2015-05-04 MED ORDER — DOCUSATE SODIUM 100 MG PO CAPS
100.0000 mg | ORAL_CAPSULE | Freq: Two times a day (BID) | ORAL | Status: DC
Start: 2015-05-04 — End: 2015-05-06
  Administered 2015-05-04 – 2015-05-06 (×4): 100 mg via ORAL
  Filled 2015-05-04 (×4): qty 1

## 2015-05-04 MED ORDER — 0.9 % SODIUM CHLORIDE (POUR BTL) OPTIME
TOPICAL | Status: DC | PRN
  Administered 2015-05-04: 1000 mL

## 2015-05-04 MED ORDER — LACTATED RINGERS IV SOLN
INTRAVENOUS | Status: DC
  Administered 2015-05-05: 02:00:00 via INTRAVENOUS

## 2015-05-04 MED ORDER — BACITRACIN ZINC 500 UNIT/GM EX OINT
TOPICAL_OINTMENT | CUTANEOUS | Status: DC | PRN
  Administered 2015-05-04: 1 via TOPICAL

## 2015-05-04 MED ORDER — ACETAMINOPHEN 325 MG PO TABS
650.0000 mg | ORAL_TABLET | ORAL | Status: DC | PRN
  Administered 2015-05-05 – 2015-05-06 (×3): 650 mg via ORAL
  Filled 2015-05-04 (×3): qty 2

## 2015-05-04 MED ORDER — HEMOSTATIC AGENTS (NO CHARGE) OPTIME
TOPICAL | Status: DC | PRN
  Administered 2015-05-04: 1 via TOPICAL

## 2015-05-04 MED ORDER — MENTHOL 3 MG MT LOZG
1.0000 | LOZENGE | OROMUCOSAL | Status: DC | PRN
  Filled 2015-05-04 (×2): qty 9

## 2015-05-04 MED ORDER — GLYCOPYRROLATE 0.2 MG/ML IJ SOLN
INTRAMUSCULAR | Status: AC
  Filled 2015-05-04: qty 2

## 2015-05-04 MED ORDER — GLYCOPYRROLATE 0.2 MG/ML IJ SOLN
INTRAMUSCULAR | Status: DC | PRN
  Administered 2015-05-04: 0.4 mg via INTRAVENOUS

## 2015-05-04 MED ORDER — ACETAMINOPHEN 160 MG/5ML PO SOLN: 325.0000 mg | ORAL | Status: DC | PRN

## 2015-05-04 MED ORDER — PROPOFOL 10 MG/ML IV BOLUS
INTRAVENOUS | Status: DC | PRN
  Administered 2015-05-04: 140 mg via INTRAVENOUS
  Administered 2015-05-04: 20 mg via INTRAVENOUS

## 2015-05-04 MED ORDER — SODIUM CHLORIDE 0.9 % IR SOLN
Status: DC | PRN
  Administered 2015-05-04: 500 mL

## 2015-05-04 MED ORDER — ACETAMINOPHEN 650 MG RE SUPP: 650.0000 mg | RECTAL | Status: DC | PRN

## 2015-05-04 MED ORDER — BISACODYL 10 MG RE SUPP: 10.0000 mg | Freq: Every day | RECTAL | Status: DC | PRN

## 2015-05-04 MED ORDER — LORATADINE 10 MG PO TABS
10.0000 mg | ORAL_TABLET | Freq: Every evening | ORAL | Status: DC
  Administered 2015-05-04 – 2015-05-05 (×2): 10 mg via ORAL
  Filled 2015-05-04 (×3): qty 1

## 2015-05-04 MED ORDER — LACTATED RINGERS IV SOLN
INTRAVENOUS | Status: DC
  Administered 2015-05-04 (×2): via INTRAVENOUS

## 2015-05-04 MED ORDER — NEOSTIGMINE METHYLSULFATE 10 MG/10ML IV SOLN
INTRAVENOUS | Status: AC
  Filled 2015-05-04: qty 1

## 2015-05-04 MED ORDER — SODIUM CHLORIDE 0.9 % IJ SOLN
INTRAMUSCULAR | Status: AC
  Filled 2015-05-04: qty 10

## 2015-05-04 MED ORDER — LOSARTAN POTASSIUM 50 MG PO TABS
100.0000 mg | ORAL_TABLET | Freq: Every day | ORAL | Status: DC
  Administered 2015-05-05 – 2015-05-06 (×2): 100 mg via ORAL
  Filled 2015-05-04 (×2): qty 2

## 2015-05-04 MED ORDER — ONDANSETRON HCL 4 MG/2ML IJ SOLN
4.0000 mg | INTRAMUSCULAR | Status: DC | PRN
  Administered 2015-05-04: 4 mg via INTRAVENOUS

## 2015-05-04 MED ORDER — EPHEDRINE SULFATE 50 MG/ML IJ SOLN
INTRAMUSCULAR | Status: DC | PRN
  Administered 2015-05-04: 5 mg via INTRAVENOUS
  Administered 2015-05-04: 10 mg via INTRAVENOUS

## 2015-05-04 MED ORDER — EPHEDRINE SULFATE 50 MG/ML IJ SOLN
INTRAMUSCULAR | Status: AC
  Filled 2015-05-04: qty 1

## 2015-05-04 MED ORDER — FENTANYL CITRATE (PF) 100 MCG/2ML IJ SOLN
INTRAMUSCULAR | Status: DC | PRN
  Administered 2015-05-04: 25 ug via INTRAVENOUS
  Administered 2015-05-04: 100 ug via INTRAVENOUS

## 2015-05-04 MED ORDER — MULTI-VITAMINS PO TABS: 1.0000 | ORAL_TABLET | Freq: Every evening | ORAL | Status: DC

## 2015-05-04 MED ORDER — CEFAZOLIN SODIUM-DEXTROSE 2-3 GM-% IV SOLR
2.0000 g | Freq: Three times a day (TID) | INTRAVENOUS | Status: AC
  Administered 2015-05-04 – 2015-05-05 (×2): 2 g via INTRAVENOUS
  Filled 2015-05-04 (×2): qty 50

## 2015-05-04 MED ORDER — HYDROMORPHONE HCL 2 MG PO TABS
4.0000 mg | ORAL_TABLET | ORAL | Status: DC | PRN
  Administered 2015-05-04 – 2015-05-06 (×4): 4 mg via ORAL
  Filled 2015-05-04 (×5): qty 2

## 2015-05-04 MED ORDER — ALBUTEROL SULFATE HFA 108 (90 BASE) MCG/ACT IN AERS
INHALATION_SPRAY | RESPIRATORY_TRACT | Status: DC | PRN
  Administered 2015-05-04: 2 via RESPIRATORY_TRACT

## 2015-05-04 MED ORDER — FENTANYL CITRATE (PF) 100 MCG/2ML IJ SOLN
25.0000 ug | INTRAMUSCULAR | Status: DC | PRN
  Administered 2015-05-04 (×2): 25 ug via INTRAVENOUS

## 2015-05-04 MED ORDER — VANCOMYCIN HCL IN DEXTROSE 1-5 GM/200ML-% IV SOLN
1000.0000 mg | INTRAVENOUS | Status: AC
  Administered 2015-05-04: 1000 mg via INTRAVENOUS

## 2015-05-04 MED ORDER — DIPHENHYDRAMINE HCL 50 MG/ML IJ SOLN
INTRAMUSCULAR | Status: DC | PRN
  Administered 2015-05-04: 12.5 mg via INTRAVENOUS

## 2015-05-04 MED ORDER — BUPIVACAINE-EPINEPHRINE (PF) 0.5% -1:200000 IJ SOLN
INTRAMUSCULAR | Status: DC | PRN
  Administered 2015-05-04 (×2): 10 mL

## 2015-05-04 MED ORDER — DIAZEPAM 5 MG PO TABS: 5.0000 mg | ORAL_TABLET | Freq: Four times a day (QID) | ORAL | Status: DC | PRN

## 2015-05-04 MED ORDER — MORPHINE SULFATE (PF) 2 MG/ML IV SOLN
1.0000 mg | INTRAVENOUS | Status: DC | PRN
  Administered 2015-05-05 (×2): 2 mg via INTRAVENOUS
  Filled 2015-05-04 (×2): qty 1
  Filled 2015-05-04: qty 2

## 2015-05-04 MED ORDER — PHENOL 1.4 % MT LIQD: 1.0000 | OROMUCOSAL | Status: DC | PRN

## 2015-05-04 MED ORDER — ROCURONIUM BROMIDE 50 MG/5ML IV SOLN
INTRAVENOUS | Status: AC
  Filled 2015-05-04: qty 1

## 2015-05-04 MED ORDER — ACETAMINOPHEN 325 MG PO TABS: 325.0000 mg | ORAL_TABLET | ORAL | Status: DC | PRN

## 2015-05-04 MED ORDER — HYDROMORPHONE HCL 4 MG PO TABS: 4.0000 mg | ORAL_TABLET | ORAL | Status: DC | PRN

## 2015-05-04 MED ORDER — METOPROLOL SUCCINATE ER 25 MG PO TB24
25.0000 mg | ORAL_TABLET | Freq: Every evening | ORAL | Status: DC
  Administered 2015-05-04: 25 mg via ORAL
  Filled 2015-05-04 (×3): qty 1

## 2015-05-04 MED ORDER — FENTANYL CITRATE (PF) 250 MCG/5ML IJ SOLN
INTRAMUSCULAR | Status: AC
Start: 2015-05-04 — End: 2015-05-04
  Filled 2015-05-04: qty 5

## 2015-05-04 MED ORDER — GABAPENTIN 300 MG PO CAPS
300.0000 mg | ORAL_CAPSULE | Freq: Three times a day (TID) | ORAL | Status: DC
  Administered 2015-05-04 – 2015-05-06 (×5): 300 mg via ORAL
  Filled 2015-05-04 (×7): qty 1

## 2015-05-04 MED ORDER — PROPOFOL 10 MG/ML IV BOLUS
INTRAVENOUS | Status: AC
  Filled 2015-05-04: qty 20

## 2015-05-04 MED ORDER — THROMBIN 5000 UNITS EX SOLR
CUTANEOUS | Status: DC | PRN
  Administered 2015-05-04 (×2): 5000 [IU] via TOPICAL

## 2015-05-04 MED ORDER — LIDOCAINE HCL (CARDIAC) 20 MG/ML IV SOLN
INTRAVENOUS | Status: AC
  Filled 2015-05-04: qty 5

## 2015-05-04 SURGICAL SUPPLY — 57 items
BAG DECANTER FOR FLEXI CONT (MISCELLANEOUS) ×3 IMPLANT
BENZOIN TINCTURE PRP APPL 2/3 (GAUZE/BANDAGES/DRESSINGS) ×3 IMPLANT
BLADE CLIPPER SURG (BLADE) IMPLANT
BRUSH SCRUB EZ PLAIN DRY (MISCELLANEOUS) ×3 IMPLANT
BUR MATCHSTICK NEURO 3.0 LAGG (BURR) ×3 IMPLANT
BUR PRECISION FLUTE 6.0 (BURR) ×3 IMPLANT
CANISTER SUCT 3000ML PPV (MISCELLANEOUS) ×3 IMPLANT
CLOSURE WOUND 1/2 X4 (GAUZE/BANDAGES/DRESSINGS) ×1
DRAPE LAPAROTOMY 100X72X124 (DRAPES) ×3 IMPLANT
DRAPE MICROSCOPE LEICA (MISCELLANEOUS) ×3 IMPLANT
DRAPE POUCH INSTRU U-SHP 10X18 (DRAPES) ×3 IMPLANT
DRAPE SURG 17X23 STRL (DRAPES) ×12 IMPLANT
ELECT BLADE 4.0 EZ CLEAN MEGAD (MISCELLANEOUS) ×3
ELECT REM PT RETURN 9FT ADLT (ELECTROSURGICAL) ×3
ELECTRODE BLDE 4.0 EZ CLN MEGD (MISCELLANEOUS) ×1 IMPLANT
ELECTRODE REM PT RTRN 9FT ADLT (ELECTROSURGICAL) ×1 IMPLANT
GAUZE SPONGE 4X4 12PLY STRL (GAUZE/BANDAGES/DRESSINGS) ×3 IMPLANT
GAUZE SPONGE 4X4 16PLY XRAY LF (GAUZE/BANDAGES/DRESSINGS) IMPLANT
GLOVE BIO SURGEON STRL SZ 6.5 (GLOVE) ×4 IMPLANT
GLOVE BIO SURGEON STRL SZ8 (GLOVE) ×3 IMPLANT
GLOVE BIO SURGEON STRL SZ8.5 (GLOVE) ×3 IMPLANT
GLOVE BIO SURGEONS STRL SZ 6.5 (GLOVE) ×2
GLOVE BIOGEL PI IND STRL 6.5 (GLOVE) ×1 IMPLANT
GLOVE BIOGEL PI IND STRL 7.5 (GLOVE) ×4 IMPLANT
GLOVE BIOGEL PI IND STRL 8 (GLOVE) ×2 IMPLANT
GLOVE BIOGEL PI INDICATOR 6.5 (GLOVE) ×2
GLOVE BIOGEL PI INDICATOR 7.5 (GLOVE) ×8
GLOVE BIOGEL PI INDICATOR 8 (GLOVE) ×4
GLOVE ECLIPSE 7.5 STRL STRAW (GLOVE) ×9 IMPLANT
GLOVE EXAM NITRILE LRG STRL (GLOVE) IMPLANT
GLOVE EXAM NITRILE MD LF STRL (GLOVE) ×3 IMPLANT
GLOVE EXAM NITRILE XL STR (GLOVE) IMPLANT
GLOVE EXAM NITRILE XS STR PU (GLOVE) IMPLANT
GOWN STRL REUS W/ TWL LRG LVL3 (GOWN DISPOSABLE) ×1 IMPLANT
GOWN STRL REUS W/ TWL XL LVL3 (GOWN DISPOSABLE) ×1 IMPLANT
GOWN STRL REUS W/TWL 2XL LVL3 (GOWN DISPOSABLE) ×3 IMPLANT
GOWN STRL REUS W/TWL LRG LVL3 (GOWN DISPOSABLE) ×2
GOWN STRL REUS W/TWL XL LVL3 (GOWN DISPOSABLE) ×2
KIT BASIN OR (CUSTOM PROCEDURE TRAY) ×3 IMPLANT
KIT ROOM TURNOVER OR (KITS) ×3 IMPLANT
NEEDLE HYPO 21X1.5 SAFETY (NEEDLE) IMPLANT
NEEDLE HYPO 22GX1.5 SAFETY (NEEDLE) ×3 IMPLANT
NS IRRIG 1000ML POUR BTL (IV SOLUTION) ×3 IMPLANT
PACK LAMINECTOMY NEURO (CUSTOM PROCEDURE TRAY) ×3 IMPLANT
PAD ARMBOARD 7.5X6 YLW CONV (MISCELLANEOUS) ×9 IMPLANT
PATTIES SURGICAL .5 X1 (DISPOSABLE) IMPLANT
RUBBERBAND STERILE (MISCELLANEOUS) ×6 IMPLANT
SPONGE SURGIFOAM ABS GEL SZ50 (HEMOSTASIS) ×3 IMPLANT
STRIP CLOSURE SKIN 1/2X4 (GAUZE/BANDAGES/DRESSINGS) ×2 IMPLANT
SUT VIC AB 1 CT1 18XBRD ANBCTR (SUTURE) ×2 IMPLANT
SUT VIC AB 1 CT1 8-18 (SUTURE) ×4
SUT VIC AB 2-0 CP2 18 (SUTURE) ×6 IMPLANT
SYR 20ML ECCENTRIC (SYRINGE) ×3 IMPLANT
TAPE CLOTH SURG 4X10 WHT LF (GAUZE/BANDAGES/DRESSINGS) ×3 IMPLANT
TOWEL OR 17X24 6PK STRL BLUE (TOWEL DISPOSABLE) ×3 IMPLANT
TOWEL OR 17X26 10 PK STRL BLUE (TOWEL DISPOSABLE) ×3 IMPLANT
WATER STERILE IRR 1000ML POUR (IV SOLUTION) ×3 IMPLANT

## 2015-05-04 NOTE — Progress Notes (Signed)
RT set up CPAP for patient. Patient has home nasal pillows. CPAP is set on auto with O2 bled in. Patient tolerating well at this time.

## 2015-05-04 NOTE — Transfer of Care (Signed)
Immediate Anesthesia Transfer of Care Note  Patient: Megan Daniels  Procedure(s) Performed: Procedure(s) with comments: RIGHT LUMBAR TWO-THREE/THREE-FOUR LAMINECTOMY/DECOMPRESSION  (Right) - Right L23 L34 laminectomy and foraminotomy  Patient Location: PACU  Anesthesia Type:General  Level of Consciousness: lethargic and responds to stimulation  Airway & Oxygen Therapy: Patient Spontanous Breathing and Patient connected to nasal cannula oxygen  Post-op Assessment: Report given to RN and Patient moving all extremities X 4  Post vital signs: Reviewed and stable  Last Vitals:  Filed Vitals:   05/04/15 1135  BP: 130/63  Pulse: 71  Temp: 36.1 C  Resp: 18    Complications: No apparent anesthesia complications

## 2015-05-04 NOTE — H&P (Signed)
Subjective: The patient is a 64 year old white female on whom I performed an L4-5 decompression and fusion about a year ago. The patient has developed recurrent pain radiating down her right leg. She has failed medical management and was worked up with a lumbar myelo CT. This demonstrated spinal stenosis at L3-4 and L2-3 on the right. I discussed the various treatment options with the patient including surgery. She has weighed the risks, benefits, alternatives to surgery and decided proceed with a right L2-3 and L3-4 laminotomy and foraminotomy.  Past Medical History  Diagnosis Date  . Stress incontinence   . Palpitations     takes Metoprolol daily  . Hypertension     takes Losartan daily  . Sarcoidosis   . Asthma     Symbicort as needed  . URI (upper respiratory infection)     treated self with Mucinex and no fever  . Seasonal allergies     takes Claritin daily  . Pneumonia     hx of-early 2000's  . History of bronchitis   . Weakness     numbness and tingling in both legs  . Foot drop     right  . Arthritis   . Joint pain   . Chronic back pain     spondylolisthesis/stenosis  . Vitamin D deficiency   . History of hiatal hernia   . GERD (gastroesophageal reflux disease)     takes Prevacid daily  . Diverticulosis   . History of colon polyps   . Urinary urgency   . Chronic UTI     was treated for 3 months with Macrobid and in Dec everything was fine  . Constipation   . Chalazion of left eye   . PONV (postoperative nausea and vomiting)     extreme nausea and vomiting -none last surgery 10/29/14  . Sleep apnea     cpap 10 yrs  . COPD (chronic obstructive pulmonary disease)   . Shortness of breath dyspnea   . Elevated serum creatinine     with nsaids  . Cancer     skin    Past Surgical History  Procedure Laterality Date  . Tonsillectomy    . Abdominal hysterectomy    . Cervical fusion    . Nissan fundoplication    . Bunionectomy Bilateral   . Cataract surgery  Bilateral   . Back surgery    . Carpal tunnel release Right     x 2  . Left middle finger surgery      pin- arthritic cyst  . Colonoscopy    . Esophagogastroduodenoscopy      Allergies  Allergen Reactions  . Vioxx [Rofecoxib] Nausea And Vomiting  . Erythromycin Base Nausea Only  . Hydrocodone Nausea Only  . Oxycodone-Acetaminophen Itching  . Talwin [Pentazocine] Nausea And Vomiting  . Vicodin [Hydrocodone-Acetaminophen] Nausea And Vomiting    Social History  Substance Use Topics  . Smoking status: Never Smoker   . Smokeless tobacco: Not on file  . Alcohol Use: Yes     Comment: wine rarely    History reviewed. No pertinent family history. Prior to Admission medications   Medication Sig Start Date End Date Taking? Authorizing Provider  Calcium Carbonate-Vitamin D (CALTRATE 600+D PO) Take 1 tablet by mouth daily.   Yes Historical Provider, MD  cholecalciferol (VITAMIN D) 1000 UNITS tablet Take 1,000 Units by mouth daily.    Yes Historical Provider, MD  diazepam (VALIUM) 5 MG tablet Take 1 tablet (5 mg total) by mouth  every 6 (six) hours as needed for muscle spasms. Patient taking differently: Take 2.5 mg by mouth at bedtime.  11/01/14  Yes Consuella Lose, MD  docusate sodium (COLACE) 100 MG capsule Take 200 mg by mouth every evening.    Yes Historical Provider, MD  gabapentin (NEURONTIN) 300 MG capsule Take 300 mg by mouth 3 (three) times daily.    Yes Historical Provider, MD  HYDROmorphone (DILAUDID) 4 MG tablet Take 2 mg by mouth every 6 (six) hours as needed for severe pain.    Yes Historical Provider, MD  lansoprazole (PREVACID) 30 MG capsule Take 30 mg by mouth 2 (two) times daily before a meal.   Yes Historical Provider, MD  loratadine (CLARITIN) 10 MG tablet Take 10 mg by mouth every evening.    Yes Historical Provider, MD  losartan (COZAAR) 100 MG tablet Take 100 mg by mouth every evening.   Yes Historical Provider, MD  metoprolol succinate (TOPROL-XL) 25 MG 24 hr tablet  Take 25 mg by mouth every evening.    Yes Historical Provider, MD  Multiple Vitamin (MULTI-VITAMINS) TABS Take 1 tablet by mouth every evening.  10/13/06  Yes Historical Provider, MD  pantoprazole (PROTONIX) 20 MG tablet Take 20 mg by mouth every evening.  07/07/09  Yes Historical Provider, MD  traMADol (ULTRAM) 50 MG tablet Take 50 mg by mouth every 6 (six) hours as needed for moderate pain.   Yes Historical Provider, MD  albuterol (PROAIR HFA) 108 (90 BASE) MCG/ACT inhaler Inhale into the lungs.    Historical Provider, MD  Ascorbic Acid (VITAMIN C WITH ROSE HIPS) 1000 MG tablet Take 1,000 mg by mouth daily.    Historical Provider, MD  aspirin EC 81 MG tablet Take 1 tablet (81 mg total) by mouth daily. 11/14/14   Consuella Lose, MD  losartan (COZAAR) 25 MG tablet Take 100 mg by mouth daily.     Historical Provider, MD  Multiple Vitamins-Minerals (MULTIVITAMIN WITH MINERALS) tablet Take 1 tablet by mouth daily.    Historical Provider, MD  oxyCODONE-acetaminophen (PERCOCET/ROXICET) 5-325 MG per tablet Take 1-2 tablets by mouth every 4 (four) hours as needed for moderate pain. Patient not taking: Reported on 01/06/2015 11/01/14   Consuella Lose, MD     Review of Systems  Positive ROS: As above  All other systems have been reviewed and were otherwise negative with the exception of those mentioned in the HPI and as above.  Objective: Vital signs in last 24 hours: Temp:  [97 F (36.1 C)] 97 F (36.1 C) (08/29 1135) Pulse Rate:  [71] 71 (08/29 1135) Resp:  [18] 18 (08/29 1135) BP: (130)/(63) 130/63 mmHg (08/29 1135) SpO2:  [96 %] 96 % (08/29 1135) Weight:  [88.905 kg (196 lb)] 88.905 kg (196 lb) (08/29 1135)  General Appearance: Alert, cooperative, no distress, Head: Normocephalic, without obvious abnormality, atraumatic Eyes: PERRL, conjunctiva/corneas clear, EOM's intact,    Ears: Normal  Throat: Normal  Neck: Supple, symmetrical, trachea midline, no adenopathy; thyroid: No  enlargement/tenderness/nodules; no carotid bruit or JVD Back: Symmetric, no curvature, ROM normal, no CVA tenderness. The patient's lumbar incision is well-healed.  Lungs: Clear to auscultation bilaterally, respirations unlabored Heart: Regular rate and rhythm, no murmur, rub or gallop Abdomen: Soft, non-tender,, no masses, no organomegaly Extremities: Extremities normal, atraumatic, no cyanosis or edema Pulses: 2+ and symmetric all extremities Skin: Skin color, texture, turgor normal, no rashes or lesions  NEUROLOGIC:   Mental status: alert and oriented, no aphasia, good attention span, Fund of  knowledge/ memory ok Motor Exam - grossly normal Sensory Exam - grossly normal Reflexes:  Coordination - grossly normal Gait - grossly normal Balance - grossly normal Cranial Nerves: I: smell Not tested  II: visual acuity  OS: Normal  OD: Normal   II: visual fields Full to confrontation  II: pupils Equal, round, reactive to light  III,VII: ptosis None  III,IV,VI: extraocular muscles  Full ROM  V: mastication Normal  V: facial light touch sensation  Normal  V,VII: corneal reflex  Present  VII: facial muscle function - upper  Normal  VII: facial muscle function - lower Normal  VIII: hearing Not tested  IX: soft palate elevation  Normal  IX,X: gag reflex Present  XI: trapezius strength  5/5  XI: sternocleidomastoid strength 5/5  XI: neck flexion strength  5/5  XII: tongue strength  Normal    Data Review Lab Results  Component Value Date   WBC 5.1 04/29/2015   HGB 12.8 04/29/2015   HCT 37.8 04/29/2015   MCV 89.6 04/29/2015   PLT 182 04/29/2015   Lab Results  Component Value Date   NA 138 04/29/2015   K 3.7 04/29/2015   CL 104 04/29/2015   CO2 24 04/29/2015   BUN 15 04/29/2015   CREATININE 1.34* 04/29/2015   GLUCOSE 131* 04/29/2015   No results found for: INR, PROTIME  Assessment/Plan: 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 I have reviewed her myelogram CT with her and pointed out the abnormalities. Her symptoms seem consistent with a right L3 and/or L4 radiculopathy possibly caused by the narrowing L2-3 and L3-4. We discussed the various treatment options including a right L2-3 and L3-4 laminectomy/laminotomy/foraminotomy. I have described the surgery to her. I have shown her surgical models. We have discussed the risks, benefits, alternatives, and likelihood of achieving our goals with surgery. I have answered all the patient's questions. She has decided to proceed with surgery. She has requested to have a Foley catheter placed and she had some incontinence with one of her prior surgeries.  Makila Colombe D 05/04/2015 2:56 PM

## 2015-05-04 NOTE — Progress Notes (Signed)
Subjective:  The patient is somnolent but easily arousable. She is in no apparent distress. She looks well.  Objective: Vital signs in last 24 hours: Temp:  [97 F (36.1 C)] 97 F (36.1 C) (08/29 1135) Pulse Rate:  [71] 71 (08/29 1135) Resp:  [18] 18 (08/29 1135) BP: (130)/(63) 130/63 mmHg (08/29 1135) SpO2:  [96 %] 96 % (08/29 1135) Weight:  [88.905 kg (196 lb)] 88.905 kg (196 lb) (08/29 1135)  Intake/Output from previous day:   Intake/Output this shift: Total I/O In: 1000 [I.V.:1000] Out: 500 [Urine:500]  Physical exam the patient is somnolent but easily arousable. She is moving her lower extremities well.  Lab Results: No results for input(s): WBC, HGB, HCT, PLT in the last 72 hours. BMET No results for input(s): NA, K, CL, CO2, GLUCOSE, BUN, CREATININE, CALCIUM in the last 72 hours.  Studies/Results: Dg Lumbar Spine 1 View  05/04/2015   CLINICAL DATA:  L2-3 and L3-4 laminectomy and foraminotomy. Prior L4-5 fusion.  EXAM: OPERATIVE LUMBAR SPINE - 1 VIEW  COMPARISON:  Lumbar myelogram and CT 04/03/2015. MRI lumbar spine 03/18/2015. Lumbar spine x-rays 12/09/2014.  FINDINGS: The same numbering scheme will be used as on prior examinations with L5 representing the last lumbar segment. Single cross-table lateral image obtained at 1559 hr and submitted for interpretation postoperatively demonstrates the localizer device just above the fusion hardware at L4, directed toward L3-4.  IMPRESSION: L3-4 localized intraoperatively.   Electronically Signed   By: Evangeline Dakin M.D.   On: 05/04/2015 16:21    Assessment/Plan: The patient is doing well.  LOS: 0 days     Megan Daniels D 05/04/2015, 5:53 PM

## 2015-05-04 NOTE — Anesthesia Preprocedure Evaluation (Addendum)
Anesthesia Evaluation  Patient identified by MRN, date of birth, ID band Patient awake    Reviewed: Allergy & Precautions, NPO status , Patient's Chart, lab work & pertinent test results, reviewed documented beta blocker date and time   History of Anesthesia Complications (+) PONV and history of anesthetic complications  Airway Mallampati: III  TM Distance: <3 FB Neck ROM: Full    Dental  (+) Teeth Intact, Dental Advisory Given   Pulmonary shortness of breath, asthma , sleep apnea and Continuous Positive Airway Pressure Ventilation , COPD COPD inhaler,  breath sounds clear to auscultation        Cardiovascular hypertension, Pt. on home beta blockers Rhythm:Regular     Neuro/Psych negative neurological ROS  negative psych ROS   GI/Hepatic Neg liver ROS, hiatal hernia, GERD-  Medicated,  Endo/Other  Morbid obesity  Renal/GU negative Renal ROS     Musculoskeletal  (+) Arthritis -,   Abdominal   Peds  Hematology negative hematology ROS (+)   Anesthesia Other Findings   Reproductive/Obstetrics                          Anesthesia Physical Anesthesia Plan  ASA: III  Anesthesia Plan: General   Post-op Pain Management:    Induction: Intravenous  Airway Management Planned: Oral ETT  Additional Equipment: None  Intra-op Plan:   Post-operative Plan: Extubation in OR  Informed Consent: I have reviewed the patients History and Physical, chart, labs and discussed the procedure including the risks, benefits and alternatives for the proposed anesthesia with the patient or authorized representative who has indicated his/her understanding and acceptance.   Dental advisory given  Plan Discussed with: CRNA and Surgeon  Anesthesia Plan Comments:         Anesthesia Quick Evaluation
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
## 2015-05-04 NOTE — Progress Notes (Signed)
Received patient from PACU via stretcher; neurologically intact.

## 2015-05-04 NOTE — Anesthesia Procedure Notes (Signed)
Procedure Name: Intubation Date/Time: 05/04/2015 3:14 PM Performed by: Sampson Si E Pre-anesthesia Checklist: Patient identified, Emergency Drugs available, Suction available, Patient being monitored and Timeout performed Patient Re-evaluated:Patient Re-evaluated prior to inductionOxygen Delivery Method: Circle system utilized Preoxygenation: Pre-oxygenation with 100% oxygen Intubation Type: IV induction Ventilation: Mask ventilation without difficulty Laryngoscope Size: Mac and 3 Grade View: Grade III Tube type: Oral Tube size: 7.0 mm Number of attempts: 2 Airway Equipment and Method: Stylet Placement Confirmation: ETT inserted through vocal cords under direct vision,  positive ETCO2 and breath sounds checked- equal and bilateral Secured at: 21 cm Tube secured with: Tape Dental Injury: Teeth and Oropharynx as per pre-operative assessment  Difficulty Due To: Difficult Airway- due to anterior larynx and Difficult Airway- due to large tongue Comments: First attempt CRNA, grade III view, entered esophagus, second attempt by MDA, grade II view.

## 2015-05-05 ENCOUNTER — Encounter (HOSPITAL_COMMUNITY): Payer: Self-pay | Admitting: Neurosurgery

## 2015-05-05 NOTE — Progress Notes (Signed)
Patient ID: Megan Daniels, female   DOB: [birth year], 64 y.o.   MRN: [id number] Subjective:  The patient is alert and pleasant. She is in no apparent distress. She did not have any trouble breathing.  Objective: Vital signs in last 24 hours: Temp:  [97 F (36.1 C)-98.3 F (36.8 C)] 98.3 F (36.8 C) (08/30 0324) Pulse Rate:  [69-89] 73 (08/30 0628) Resp:  [12-25] 25 (08/30 0628) BP: (100-141)/(47-76) 111/63 mmHg (08/30 0628) SpO2:  [91 %-97 %] 95 % (08/30 0628) Weight:  [88.905 kg (196 lb)] 88.905 kg (196 lb) (08/29 1135)  Intake/Output from previous day: 08/29 0701 - 08/30 0700 In: 2391.3 [P.O.:240; I.V.:2051.3; IV Piggyback:100] Out: 2750 [Urine:2750] Intake/Output this shift:    Physical exam the patient is alert and pleasant. She is moving her lower extremities well.  Lab Results: No results for input(s): WBC, HGB, HCT, PLT in the last 72 hours. BMET No results for input(s): NA, K, CL, CO2, GLUCOSE, BUN, CREATININE, CALCIUM in the last 72 hours.  Studies/Results: Dg Lumbar Spine 1 View  05/04/2015   CLINICAL DATA:  L2-3 and L3-4 laminectomy and foraminotomy. Prior L4-5 fusion.  EXAM: OPERATIVE LUMBAR SPINE - 1 VIEW  COMPARISON:  Lumbar myelogram and CT 04/03/2015. MRI lumbar spine 03/18/2015. Lumbar spine x-rays 12/09/2014.  FINDINGS: The same numbering scheme will be used as on prior examinations with L5 representing the last lumbar segment. Single cross-table lateral image obtained at 1559 hr and submitted for interpretation postoperatively demonstrates the localizer device just above the fusion hardware at L4, directed toward L3-4.  IMPRESSION: L3-4 localized intraoperatively.   Electronically Signed   By: Evangeline Dakin M.D.   On: 05/04/2015 16:21    Assessment/Plan: Postop day #1: We will mobilize the patient with PT. I will transfer her to 4 N. She will likely go home tomorrow.  LOS: 1 day     Megan Daniels D 05/05/2015, 7:44 AM

## 2015-05-05 NOTE — Progress Notes (Signed)
RT note: Patient placed herself on CPAP and is resting comfortably.

## 2015-05-05 NOTE — Anesthesia Postprocedure Evaluation (Signed)
  Anesthesia Post-op Note  Patient: Megan Daniels  Procedure(s) Performed: Procedure(s) with comments: RIGHT LUMBAR TWO-THREE/THREE-FOUR LAMINECTOMY/DECOMPRESSION  (Right) - Right L23 L34 laminectomy and foraminotomy  Patient Location: PACU  Anesthesia Type:General  Level of Consciousness: awake and alert   Airway and Oxygen Therapy: Patient Spontanous Breathing  Post-op Pain: mild  Post-op Assessment: Post-op Vital signs reviewed LLE Motor Response: Purposeful movement, Responds to commands LLE Sensation: Full sensation RLE Motor Response: Purposeful movement, Responds to commands RLE Sensation: Numbness, No tingling (states this was baseline before surgery)      Post-op Vital Signs: Reviewed  Last Vitals:  Filed Vitals:   05/05/15 0700  BP: 99/57  Pulse: 70  Temp:   Resp: 20    Complications: No apparent anesthesia complications

## 2015-05-05 NOTE — Progress Notes (Signed)
Pt is admitted to 5C05. Admission vital is stable

## 2015-05-05 NOTE — Progress Notes (Signed)
Physical Therapy Evaluation Patient Details Name: BRITNI DRISCOLL MRN: [id number] DOB: [birth year] Today's Date: 05/05/2015   History of Present Illness  64 year old Taziah Difatta female on whom I performed an L4-5 decompression and fusion about a year ago. The patient has developed recurrent pain radiating down her right leg. She has failed medical management and was worked up with a lumbar myelo CT. This demonstrated spinal stenosis at L3-4 and L2-3 on the right. I discussed the various treatment options with the patient including surgery. She has weighed the risks, benefits, alternatives to surgery and decided proceed with a right L2-3 and L3-4 laminotomy and foraminotomy.   Clinical Impression  Patient seated in recliner and agreeable to participate in PT today. Patient was able to ambulate and transfer as described below. PT reviewed precautions, gave handout to patient, and had patient verbalize understanding throughout session. She will have help 24/7 initially to set up her home to accommodate the precautions. Patient will benefit from continued PT to address stairs and improve ambulatory endurance to allow safe discharge home.    Follow Up Recommendations Supervision/Assistance - 24 hour;Home health PT (Until home is set up to follow precautions.) Patient requesting [facility], Candis Shine, who she has worked with before.    Equipment Recommendations  None recommended by PT    Recommendations for Other Services       Precautions / Restrictions Precautions Precautions: Back Precaution Booklet Issued: Yes (comment) Precaution Comments: Patient had previous back surgery, familiar with most precautions. Restrictions Weight Bearing Restrictions: No      Mobility  Bed Mobility               General bed mobility comments: Pt in chair upon PT arrival. Reports she knows rolling process and is comfortable with that.  Transfers Overall transfer level: Modified independent Equipment used:  Rolling walker (2 wheeled)             General transfer comment: Able to stand slowly but with no physical assistance. Able to sit under control without physical assistance.  Ambulation/Gait Ambulation/Gait assistance: Modified independent (Device/Increase time) Ambulation Distance (Feet): 120 Feet Assistive device: Rolling walker (2 wheeled) Gait Pattern/deviations: Step-through pattern;Decreased stride length;Trunk flexed Gait velocity: Decreased Gait velocity interpretation: Below normal speed for age/gender General Gait Details: Patient walks very slowly.Says her head feels "fuzzy" but denies dizziness. Steady with use of RW, no LOB or staggering noted.   Stairs Stairs:  (Will do stairs next session, pt denied this time.)          Wheelchair Mobility    Modified Rankin (Stroke Patients Only)       Balance Overall balance assessment: Modified Independent;No apparent balance deficits (not formally assessed)                                           Pertinent Vitals/Pain Pain Assessment: 0-10 Pain Score: 4  Pain Location: Back - patient reports it is purely an incision soreness. Pain Descriptors / Indicators: Operative site guarding Pain Intervention(s): Monitored during session;Premedicated before session    Margaret expects to be discharged to:: Private residence Living Arrangements: Spouse/significant other;Children Available Help at Discharge: Family;Available 24 hours/day Type of Home: House Home Access: Stairs to enter Entrance Stairs-Rails: Left Entrance Stairs-Number of Steps: 2 Home Layout: One level Home Equipment: Shower seat;Bedside commode;Walker - 4 wheels Additional Comments: Patient just recently had house remodeled  to be more accessible.    Prior Function Level of Independence: Independent         Comments: Pt works fulltime as Merchandiser, retail.       Hand Dominance        Extremity/Trunk  Assessment   Upper Extremity Assessment: Overall WFL for tasks assessed           Lower Extremity Assessment: RLE deficits/detail RLE Deficits / Details: Decreased R DF strength (4/5).        Communication   Communication: No difficulties  Cognition Arousal/Alertness: Awake/alert Behavior During Therapy: WFL for tasks assessed/performed Overall Cognitive Status: Within Functional Limits for tasks assessed                      General Comments      Exercises        Assessment/Plan    PT Assessment Patient needs continued PT services  PT Diagnosis Abnormality of gait;Acute pain   PT Problem List Decreased strength;Decreased activity tolerance;Decreased mobility;Decreased knowledge of precautions;Impaired sensation  PT Treatment Interventions Gait training;Stair training;Functional mobility training;Therapeutic activities;Therapeutic exercise;Patient/family education   PT Goals (Current goals can be found in the Care Plan section) Acute Rehab PT Goals Patient Stated Goal: Return to work. PT Goal Formulation: With patient Time For Goal Achievement: 05/19/15 Potential to Achieve Goals: Good    Frequency Min 5X/week   Barriers to discharge        Co-evaluation               End of Session   Activity Tolerance: Patient limited by fatigue;No increased pain Patient left: in chair;with call bell/phone within reach;with family/visitor present Nurse Communication: Mobility status         Time: 3662-9476 PT Time Calculation (min) (ACUTE ONLY): 25 min   Charges:   PT Evaluation $Initial PT Evaluation Tier I: 1 Procedure PT Treatments $Self Care/Home Management: 04-May-2023   PT G CodesRoanna Epley, SPT [phone] 05/05/2015, 12:04 PM  I have read, reviewed and agree with student's note.   [place] [phone] (pager)

## 2015-05-05 NOTE — Progress Notes (Addendum)
Pt to [place] to 5C-05, VSS, family present & aware of pending [place]. Pt worked with PT/OT, BRP, voided this am.

## 2015-05-06 MED ORDER — CYCLOBENZAPRINE HCL 10 MG PO TABS: 10.0000 mg | ORAL_TABLET | Freq: Three times a day (TID) | ORAL | Status: DC | PRN

## 2015-05-06 MED ORDER — HYDROMORPHONE HCL 4 MG PO TABS: 4.0000 mg | ORAL_TABLET | ORAL | Status: DC | PRN

## 2015-05-06 NOTE — Progress Notes (Signed)
Physical Therapy Treatment Patient Details Name: Megan Daniels MRN: [id number] DOB: 1951-09-02 Today's Date: 05/06/2015    History of Present Illness 64 year old white female on whom I performed an L4-5 decompression and fusion about a year ago. The patient has developed recurrent pain radiating down her right leg. She has failed medical management and was worked up with a lumbar myelo CT. This demonstrated spinal stenosis at L3-4 and L2-3 on the right. I discussed the various treatment options with the patient including surgery. She has weighed the risks, benefits, alternatives to surgery and decided proceed with a right L2-3 and L3-4 laminotomy and foraminotomy.     PT Comments    Patient performed stair negotiation, ambulation, and reviewed precautions and mobility expectations for discharge. Patient with some increased pain but tolerated well.  Follow Up Recommendations  Supervision/Assistance - 24 hour;Home health PT (Until home is set up to follow precautions.)     Equipment Recommendations  None recommended by PT    Recommendations for Other Services       Precautions / Restrictions Precautions Precautions: Back Precaution Booklet Issued: Yes (comment) Precaution Comments: Patient had previous back surgery, familiar with most precautions. Restrictions Weight Bearing Restrictions: No    Mobility  Bed Mobility Overal bed mobility: Needs Assistance Bed Mobility: Sit to Sidelying           General bed mobility comments: VCs to reinforce technique and compliance with precautions  Transfers Overall transfer level: Modified independent Equipment used: Rolling walker (2 wheeled)             General transfer comment: able to stand from chair without device and from bed with RW  Ambulation/Gait Ambulation/Gait assistance: Modified independent (Device/Increase time) Ambulation Distance (Feet): 180 Feet Assistive device: Rolling walker (2 wheeled) Gait  Pattern/deviations: Step-through pattern;Decreased stride length;Trunk flexed Gait velocity: Decreased Gait velocity interpretation: Below normal speed for age/gender General Gait Details: Patient walks very slowly.Says her head feels "fuzzy" but denies dizziness. Steady with use of RW, no LOB or staggering noted.    Stairs Stairs: Yes Stairs assistance: Supervision Stair Management: One rail Right;Step to pattern;Forwards Number of Stairs: 6 General stair comments: educated on technique for step to method with sequencing for pain relief  Wheelchair Mobility    Modified Rankin (Stroke Patients Only)       Balance                                    Cognition Arousal/Alertness: Awake/alert Behavior During Therapy: WFL for tasks assessed/performed Overall Cognitive Status: Within Functional Limits for tasks assessed                      Exercises      General Comments        Pertinent Vitals/Pain Pain Assessment: 0-10 Pain Score: 6  Pain Location: back Pain Descriptors / Indicators: Operative site guarding Pain Intervention(s): Monitored during session;Premedicated before session    Home Living                      Prior Function            PT Goals (current goals can now be found in the care plan section) Acute Rehab PT Goals Patient Stated Goal: Return to work. PT Goal Formulation: With patient Time For Goal Achievement: 05/19/15 Potential to Achieve Goals: Good Progress towards PT goals: Progressing toward  goals    Frequency  Min 5X/week    PT Plan      Co-evaluation             End of Session   Activity Tolerance: Patient limited by fatigue;No increased pain Patient left: in bed;with call bell/phone within reach;with family/visitor present     Time: 7366-8159 PT Time Calculation (min) (ACUTE ONLY): 18 min  Charges:  $Self Care/Home Management: 2023/05/03                    G CodesDuncan Dull 2015-05-12, 1:55 PM Alben Deeds, [place] DPT  [phone]

## 2015-05-06 NOTE — Care Management Note (Signed)
Case Management Note  Patient Details  Name: Megan Daniels MRN: [id number] Date of Birth: [birth year]  Subjective/Objective:                    Action/Plan: Verbal orders received to arrange HHPT for patient.  Patient has requested services through [facility] and has requested a specific therapist, who has worked with her in the past. Miranda with [facility] was notified and has accepted the referral for discharge home today. Jeannetta Nap is aware that there is no face-to-face available at this time, but physician's office has been notified.  Expected Discharge Date:                  Expected Discharge Plan:  [place]  In-House Referral:     Discharge planning Services     Post Acute Care Choice:  Home Health Choice offered to:     DME Arranged:    DME Agency:     HH Arranged:  PT [place]:  [place]  Status of Service:  Completed, signed off  Medicare Important Message Given:    Date Medicare IM Given:    Medicare IM give by:    Date Additional Medicare IM Given:    Additional Medicare Important Message give by:     If discussed at [place] of Stay Meetings, dates discussed:    Additional Comments:  Rolm Baptise, RN 05/06/2015, 10:28 AM

## 2015-05-06 NOTE — Progress Notes (Signed)
Patient requested home health physical therapy, MD made aware, MD gave verbal orders for home health physical therapy. Message left with Dr. Adline Mango secretary for MD to fill out face to face.

## 2015-05-06 NOTE — Discharge Summary (Signed)
Physician Discharge Summary  Patient ID: Megan Daniels MRN: [id number] DOB/AGE: 09-Jan-1951 64 y.o.  Admit date: 05/04/2015 Discharge date: 05/06/2015  Admission Diagnoses: L2-3 and L3-4 spinal stenosis, lumbago, lumbar radiculopathy, neurogenic claudication  Discharge Diagnoses: The same Active Problems:   Lumbar stenosis   Discharged Condition: good  Hospital Course: I performed a right L2-3 and L3-4 laminectomy/laminotomy on the patient on 05/04/2015. The surgery went well.  The patient's postoperative course was unremarkable. On postoperative day #2 she requested discharge to home. The patient and her husband were  given oral and written discharge instructions. All her questions were answered.  Consults: Physical therapy  Significant Diagnostic Studies: None Treatments: Right L2-3 and L3-4 laminectomy/laminotomy using microdissection Discharge Exam: Blood pressure 116/58, pulse 91, temperature 99.3 F (37.4 C), temperature source Oral, resp. rate 16, height 5\' 3"  (1.6 m), weight 88.905 kg (196 lb), SpO2 95 %. The patient is alert and pleasant. Her strength is grossly normal in her lower extremities. She looks well.  Disposition: Home  Discharge Instructions    Call MD for:  difficulty breathing, headache or visual disturbances    Complete by:  As directed      Call MD for:  extreme fatigue    Complete by:  As directed      Call MD for:  hives    Complete by:  As directed      Call MD for:  persistant dizziness or light-headedness    Complete by:  As directed      Call MD for:  persistant nausea and vomiting    Complete by:  As directed      Call MD for:  redness, tenderness, or signs of infection (pain, swelling, redness, odor or green/yellow discharge around incision site)    Complete by:  As directed      Call MD for:  severe uncontrolled pain    Complete by:  As directed      Call MD for:  temperature >100.4    Complete by:  As directed      Diet - low sodium  heart healthy    Complete by:  As directed      Discharge instructions    Complete by:  As directed   Call [phone] for a followup appointment. Take a stool softener while you are using pain medications.     Driving Restrictions    Complete by:  As directed   Do not drive for 2 weeks.     Increase activity slowly    Complete by:  As directed      Lifting restrictions    Complete by:  As directed   Do not lift more than 5 pounds. No excessive bending or twisting.     May shower / Bathe    Complete by:  As directed   He may shower after the pain she is removed 3 days after surgery. Leave the incision alone.     Remove dressing in 24 hours    Complete by:  As directed             Medication List    STOP taking these medications        diazepam 5 MG tablet  Commonly known as:  VALIUM     oxyCODONE-acetaminophen 5-325 MG per tablet  Commonly known as:  PERCOCET/ROXICET      TAKE these medications        aspirin EC 81 MG tablet  Take 1 tablet (81 mg total)  by mouth daily.     CALTRATE 600+D PO  Take 1 tablet by mouth daily.     cholecalciferol 1000 UNITS tablet  Commonly known as:  VITAMIN D  Take 1,000 Units by mouth daily.     cyclobenzaprine 10 MG tablet  Commonly known as:  FLEXERIL  Take 1 tablet (10 mg total) by mouth 3 (three) times daily as needed for muscle spasms.     docusate sodium 100 MG capsule  Commonly known as:  COLACE  Take 200 mg by mouth every evening.     gabapentin 300 MG capsule  Commonly known as:  NEURONTIN  Take 300 mg by mouth 3 (three) times daily.     HYDROmorphone 4 MG tablet  Commonly known as:  DILAUDID  Take 2 mg by mouth every 6 (six) hours as needed for severe pain.     HYDROmorphone 4 MG tablet  Commonly known as:  DILAUDID  Take 1 tablet (4 mg total) by mouth every 4 (four) hours as needed for moderate pain or severe pain.     lansoprazole 30 MG capsule  Commonly known as:  PREVACID  Take 30 mg by mouth 2 (two)  times daily before a meal.     loratadine 10 MG tablet  Commonly known as:  CLARITIN  Take 10 mg by mouth every evening.     losartan 100 MG tablet  Commonly known as:  COZAAR  Take 100 mg by mouth every evening.     metoprolol succinate 25 MG 24 hr tablet  Commonly known as:  TOPROL-XL  Take 25 mg by mouth every evening.     MULTI-VITAMINS Tabs  Take 1 tablet by mouth every evening.     multivitamin with minerals tablet  Take 1 tablet by mouth daily.     pantoprazole 20 MG tablet  Commonly known as:  PROTONIX  Take 20 mg by mouth every evening.     PROAIR HFA 108 (90 BASE) MCG/ACT inhaler  Generic drug:  albuterol  Inhale into the lungs.     traMADol 50 MG tablet  Commonly known as:  ULTRAM  Take 50 mg by mouth every 6 (six) hours as needed for moderate pain.     vitamin C with rose hips 1000 MG tablet  Take 1,000 mg by mouth daily.         SignedNewman Pies D 05/06/2015, 10:06 AM

## 2015-05-06 NOTE — Progress Notes (Signed)
Patient discharged home with cousin. RN discussed discharge instructions with patient and family. Prescription, discharge instructions given to patient. Patient vocalized understanding of discharge instructions. Patient waiting for ride, patient to be escorted by wheelchair to car by guest services.

## 2015-05-06 NOTE — Progress Notes (Signed)
Patient taken to car by wheelchair by guest services.

## 2015-06-03 ENCOUNTER — Encounter: Payer: Self-pay | Admitting: Physical Therapy

## 2015-06-03 ENCOUNTER — Ambulatory Visit: Payer: Medicare Other | Attending: Physical Medicine and Rehabilitation | Admitting: Physical Therapy

## 2015-06-03 DIAGNOSIS — M545 Low back pain, unspecified: Secondary | ICD-10-CM

## 2015-06-03 DIAGNOSIS — M6281 Muscle weakness (generalized): Secondary | ICD-10-CM | POA: Diagnosis present

## 2015-06-04 ENCOUNTER — Encounter: Payer: Self-pay | Admitting: Physical Therapy

## 2015-06-04 NOTE — Therapy (Addendum)
[facility] [address]. [place], [place], [address] Phone: [phone]   Fax:  [phone]  Physical Therapy Evaluation  Patient Details  Name: Megan Daniels MRN: [id number] Date of Birth: 1951/07/08 Referring Provider:  Newman Pies, MD  Encounter Date: 06/03/2015      PT End of Session - 06/03/15 1241    Visit Number 1   Number of Visits 8   Date for PT Re-Evaluation 07/01/15   Authorization - Visit Number 1   Authorization - Number of Visits 10   PT Start Time 0901   PT Stop Time 0258   PT Time Calculation (min) 54 min   Equipment Utilized During Treatment Gait belt   Activity Tolerance Patient tolerated treatment well;No increased pain   Behavior During Therapy [facility] for tasks assessed/performed      Past Medical History  Diagnosis Date  . Stress incontinence   . Palpitations     takes Metoprolol daily  . Hypertension     takes Losartan daily  . Sarcoidosis   . Asthma     Symbicort as needed  . URI (upper respiratory infection)     treated self with Mucinex and no fever  . Seasonal allergies     takes Claritin daily  . Pneumonia     hx of-early 2000's  . History of bronchitis   . Weakness     numbness and tingling in both legs  . Foot drop     right  . Arthritis   . Joint pain   . Chronic back pain     spondylolisthesis/stenosis  . Vitamin D deficiency   . History of hiatal hernia   . GERD (gastroesophageal reflux disease)     takes Prevacid daily  . Diverticulosis   . History of colon polyps   . Urinary urgency   . Chronic UTI     was treated for 3 months with Macrobid and in Dec everything was fine  . Constipation   . Chalazion of left eye   . PONV (postoperative nausea and vomiting)     extreme nausea and vomiting -none last surgery 10/29/14  . Sleep apnea     cpap 10 yrs  . COPD (chronic obstructive pulmonary disease)   . Shortness of breath dyspnea   . Elevated serum creatinine      with nsaids  . Cancer     skin    Past Surgical History  Procedure Laterality Date  . Tonsillectomy    . Abdominal hysterectomy    . Cervical fusion    . Nissan fundoplication    . Bunionectomy Bilateral   . Cataract surgery Bilateral   . Back surgery    . Carpal tunnel release Right     x 2  . Left middle finger surgery      pin- arthritic cyst  . Colonoscopy    . Esophagogastroduodenoscopy    . Lumbar laminectomy/decompression microdiscectomy Right 05/04/2015    Procedure: RIGHT LUMBAR TWO-THREE/THREE-FOUR LAMINECTOMY/DECOMPRESSION ;  Surgeon: Newman Pies, MD;  Location: [facility];  Service: Neurosurgery;  Laterality: Right;  Right L23 L34 laminectomy and foraminotomy    There were no vitals filed for this visit.  Visit Diagnosis:  Midline low back pain without sciatica  Muscle weakness      Subjective Assessment - 06/03/15 1233    Subjective Pt reports having surgery on 05/04/15 followed by 3 weeks of HHPT. Pt reports no pain just a "tired and  thick" feeling in her low back. Pt plans to return to work on 07/06/15. Pt reports ambulating outside with a SPC.    Pertinent History see past PT notes and MD surgical report    Limitations Standing;Walking;Lifting;Sitting   How long can you sit comfortably? 15-20 minutes   How long can you stand comfortably? 10-15 mins   How long can you walk comfortably? 10 mins   Diagnostic tests XRAYS   Patient Stated Goals Increase core strength/ pain-free work-related tasks/ being able to sit for an hour/ ability to pick up 30# granddaughter/return to walking    Currently in Pain? No/denies      OBJECTIVE: ODI: 48%, self-perceived severe disability Proximal hamstring length in supine L/R: 60 degrees/54 degrees MMT: B LE grossly 4/5 There ex: Reviewed HEP in detail, encouraged pt to perform daily and to stretch with more consistency. Reassessed TrA contraction with bridging/marching/straight leg raise/bicycle (no c/o increased  pain).          PT Education - 06/03/15 1240    Education provided Yes   Education Details Pt instructed to continue home exercises from HHPT and add in core stability. Pt educated on importance of icing to decrease inflammation.    Person(s) Educated Patient   Methods Explanation   Comprehension Verbalized understanding            PT Long Term Goals - 06/04/15 4580    PT LONG TERM GOAL #1   Title Pt will decrease ODI score from 48% to 20% in order to improve functional mobility.   Baseline 48% on 9/28    Time 4   Period Weeks   Status New   PT LONG TERM GOAL #2   Title Pt will be able to ambulate on all terrians with no assistive device in order to safely return to work.    Baseline currently using a SPC for outdoor ambulation    Time 4   Period Weeks   Status New   PT LONG TERM GOAL #3   Title Pt will be independent in home exercise program in order to tolerate sitting for 30 mins without increased complaints of pain.   Baseline currently only able to sit or drive for 99-83 mins.   Time 4   Period Weeks   Status New   PT LONG TERM GOAL #4   Title Pt will demonstrate proper lifting techniques with 30# consistently and safely in order to be able to pick up her granddaughter.    Baseline able to do 7# on 9/28   Time 4   Period Weeks   Status New   PT LONG TERM GOAL #5   Title Pt will return to walking program in order to return to prior level of function.    Time 4   Period Weeks   Status New               Plan - 06/03/15 1243    Clinical Impression Statement Pt is a 64 y.o. F s/p L2-L3, L3-4 laminectomy DOS: 05/04/15. Pt received 3 weeks of HHPT and has no complaints of pain. Pt complains of tightness and fatigue in her low back. 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  Pt's lumbar AROM is WFL. Pt MMT is grossly 4/5. Pt presents with proximal hamstring tightness in supine L/R: 60 degrees/54 degrees. Pt demonstrates  proper lifting techinques of a 7# box with verbal cues to keep her knees shoulder  width apart. ODI: 48%, self-perceived severe disability. Pt will benefit from short term skilled PT to address functional deficits, strengthen her back and core to improve functional mobility and work related tasks.    Pt will benefit from skilled therapeutic intervention in order to improve on the following deficits Decreased endurance;Improper body mechanics;Impaired flexibility;Decreased strength;Decreased mobility;Pain;Decreased range of motion   Rehab Potential Good   Clinical Impairments Affecting Rehab Potential previous back surgeries.    PT Frequency 2x / week   PT Duration 4 weeks   PT Treatment/Interventions ADLs/Self Care Home Management;Cryotherapy;Moist Heat;Balance training;Therapeutic exercise;Therapeutic activities;Functional mobility training;Stair training;Gait training;Neuromuscular re-education;Manual techniques;Passive range of motion;Scar mobilization   PT Next Visit Plan progress core program/progress box lifts and functional work tasks    PT Home Exercise Plan continue with core stability and HEP from HHPT. encouraged pt to perform stretching daily.    Consulted and Agree with Plan of Care Patient          G-Codes - Jun 18, 2015 0911    Functional Assessment Tool Used clinical judgement/ Oswestry/ pain   Functional Limitation Changing and maintaining body position   Changing and Maintaining Body Position Current Status [phone]) At least 20 percent but less than 40 percent impaired, limited or restricted   Changing and Maintaining Body Position Goal Status ([id number]) At least 1 percent but less than 20 percent impaired, limited or restricted       Problem List Patient Active Problem List   Diagnosis Date Noted  . Lumbar stenosis 05/04/2015  . Spondylolisthesis of lumbar region 10/29/2014  . BP (high blood pressure) 03/13/2014  . HLD (hyperlipidemia) 03/13/2014  . Awareness of heartbeats  03/13/2014  . Besnier-Boeck disease 03/13/2014  . Airway hyperreactivity 02/15/2013   Pura Spice, PT, DPT # [id number]   June 18, 2015, 9:14 AM  [facility] [address] [place], [place], [address] Phone: [phone]   Fax:  [phone]

## 2015-06-11 ENCOUNTER — Ambulatory Visit: Payer: Medicare Other

## 2015-06-15 ENCOUNTER — Ambulatory Visit: Payer: Medicare Other | Attending: Physical Medicine and Rehabilitation | Admitting: Physical Therapy

## 2015-06-15 ENCOUNTER — Encounter: Payer: Self-pay | Admitting: Physical Therapy

## 2015-06-15 DIAGNOSIS — M545 Low back pain, unspecified: Secondary | ICD-10-CM

## 2015-06-15 DIAGNOSIS — M6281 Muscle weakness (generalized): Secondary | ICD-10-CM | POA: Diagnosis present

## 2015-06-16 ENCOUNTER — Ambulatory Visit
Admission: RE | Admit: 2015-06-16 | Discharge: 2015-06-16 | Disposition: A | Discharge status: Home / Self Care | Payer: Medicare Other | Source: Ambulatory Visit | Attending: Internal Medicine | Admitting: Internal Medicine

## 2015-06-16 ENCOUNTER — Other Ambulatory Visit: Payer: Self-pay | Admitting: Internal Medicine

## 2015-06-16 DIAGNOSIS — Z1231 Encounter for screening mammogram for malignant neoplasm of breast: Secondary | ICD-10-CM

## 2015-06-16 NOTE — Therapy (Signed)
[facility] [address]. [place], [place], [address] Phone: [phone]   Fax:  [phone]  Physical Therapy Treatment  Patient Details  Name: Megan Daniels MRN: [id number] Date of Birth: 1951/02/09 Referring Provider:  Newman Pies, MD  Encounter Date: 06/15/2015      PT End of Session - 06/16/15 0924    Visit Number 2   Number of Visits 8   Date for PT Re-Evaluation 07/01/15   Authorization - Visit Number 2   Authorization - Number of Visits 10   PT Start Time 1324   PT Stop Time 4010   PT Time Calculation (min) 45 min   Activity Tolerance Patient tolerated treatment well;No increased pain   Behavior During Therapy [facility] for tasks assessed/performed      Past Medical History  Diagnosis Date  . Stress incontinence   . Palpitations     takes Metoprolol daily  . Hypertension     takes Losartan daily  . Sarcoidosis ([place])   . Asthma     Symbicort as needed  . URI (upper respiratory infection)     treated self with Mucinex and no fever  . Seasonal allergies     takes Claritin daily  . Pneumonia     hx of-early 2000's  . History of bronchitis   . Weakness     numbness and tingling in both legs  . Foot drop     right  . Arthritis   . Joint pain   . Chronic back pain     spondylolisthesis/stenosis  . Vitamin D deficiency   . History of hiatal hernia   . GERD (gastroesophageal reflux disease)     takes Prevacid daily  . Diverticulosis   . History of colon polyps   . Urinary urgency   . Chronic UTI     was treated for 3 months with Macrobid and in Dec everything was fine  . Constipation   . Chalazion of left eye   . PONV (postoperative nausea and vomiting)     extreme nausea and vomiting -none last surgery 10/29/14  . Sleep apnea     cpap 10 yrs  . COPD (chronic obstructive pulmonary disease) ([place])   . Shortness of breath dyspnea   . Elevated serum creatinine     with nsaids  . Cancer [facility]     skin     Past Surgical History  Procedure Laterality Date  . Tonsillectomy    . Abdominal hysterectomy    . Cervical fusion    . Nissan fundoplication    . Bunionectomy Bilateral   . Cataract surgery Bilateral   . Back surgery    . Carpal tunnel release Right     x 2  . Left middle finger surgery      pin- arthritic cyst  . Colonoscopy    . Esophagogastroduodenoscopy    . Lumbar laminectomy/decompression microdiscectomy Right 05/04/2015    Procedure: RIGHT LUMBAR TWO-THREE/THREE-FOUR LAMINECTOMY/DECOMPRESSION ;  Surgeon: Newman Pies, MD;  Location: [facility];  Service: Neurosurgery;  Laterality: Right;  Right L23 L34 laminectomy and foraminotomy  . Breast biopsy Right 1996    EXCISIONAL - NEG    There were no vitals filed for this visit.  Visit Diagnosis:  Midline low back pain without sciatica  Muscle weakness      Subjective Assessment - 06/15/15 1345    Subjective No new complaints.  Pt. planning on returning to work on 10/31 and  is concerned about back/ reinjury with patient care.     Pertinent History see past PT notes and MD surgical report    Limitations Standing;Walking;Lifting;Sitting   How long can you sit comfortably? 15-20 minutes   How long can you stand comfortably? 10-15 mins   How long can you walk comfortably? 10 mins   Diagnostic tests XRAYS   Patient Stated Goals Increase core strength/ pain-free work-related tasks/ being able to sit for an hour/ ability to pick up 30# granddaughter/return to walking    Currently in Pain? No/denies       OBJECTIVE: There.ex: Reviewed HEP.  Supine TrA ex.: hip adduction with pink ball 20x with 5-10 sec. Holds/ bridging 10x2 (cuing to decrease neck assist)/ dead bug 10x2 with proper breathing.  Supine sitting/ posture correction in sitting and standing tasks at end of mat table.  Standing hip ex." flexion/ abd./ ext./ knee flexion/ heel raises 20x each (mirror feedback with 1 short seated rest break).  High marching in  //-bars with walking and tandem stance in //-bars with no UE assist. 10# box lifting/ B carrying around PT clinic with proper technique.  Manual tx.: supine LE/ hip stretches all planes as tolerated.      Pt response for medical necessity: min. To mod. Cuing for proper technique/ breathing with core stability ex.  No c/o pain and showing slow but consistent progress.  Pt. Very fearful of reinjurying low back with return to work.           PT Long Term Goals - 06/04/15 1157    PT LONG TERM GOAL #1   Title Pt will decrease ODI score from 48% to 20% in order to improve functional mobility.   Baseline 48% on 9/28    Time 4   Period Weeks   Status New   PT LONG TERM GOAL #2   Title Pt will be able to ambulate on all terrians with no assistive device in order to safely return to work.    Baseline currently using a SPC for outdoor ambulation    Time 4   Period Weeks   Status New   PT LONG TERM GOAL #3   Title Pt will be independent in home exercise program in order to tolerate sitting for 30 mins without increased complaints of pain.   Baseline currently only able to sit or drive for 26-20 mins.   Time 4   Period Weeks   Status New   PT LONG TERM GOAL #4   Title Pt will demonstrate proper lifting techniques with 30# consistently and safely in order to be able to pick up her granddaughter.    Baseline able to do 7# on 9/28   Time 4   Period Weeks   Status New   PT LONG TERM GOAL #5   Title Pt will return to walking program in order to return to prior level of function.    Time 4   Period Weeks   Status New            Plan - 06/16/15 0925    Clinical Impression Statement Pt. requires moderate verbal cuing for proper technique with ther.ex./ breathing during supine TrA ex. program.  Minimal B LE muscle fatigue noted with standing ther.ex./ walking tasks in clinic.  Pt. ambulating with more normalized gait pattern and consistent arm swing on level surfaces in PT clinic.  Good  upright posture/ lifting technique with B carrying of 10# box. around gym.  Pt will benefit from skilled therapeutic intervention in order to improve on the following deficits Decreased endurance;Improper body mechanics;Impaired flexibility;Decreased strength;Decreased mobility;Pain;Decreased range of motion   Rehab Potential Good   Clinical Impairments Affecting Rehab Potential previous back surgeries.    PT Frequency 2x / week   PT Duration 4 weeks   PT Treatment/Interventions ADLs/Self Care Home Management;Cryotherapy;Moist Heat;Balance training;Therapeutic exercise;Therapeutic activities;Functional mobility training;Stair training;Gait training;Neuromuscular re-education;Manual techniques;Passive range of motion;Scar mobilization   PT Next Visit Plan progress core program/progress box lifts and functional work tasks    PT Home Exercise Plan continue with core stability and HEP from HHPT. encouraged pt to perform stretching daily.         Problem List Patient Active Problem List   Diagnosis Date Noted  . Lumbar stenosis 05/04/2015  . Spondylolisthesis of lumbar region 10/29/2014  . BP (high blood pressure) 03/13/2014  . HLD (hyperlipidemia) 03/13/2014  . Awareness of heartbeats 03/13/2014  . Besnier-Boeck disease ([place]) 03/13/2014  . Airway hyperreactivity 02/15/2013   Pura Spice, PT, DPT # [id number]   06/16/2015, 9:33 AM  [facility] [address] [place], [place], [address] Phone: [phone]   Fax:  [phone]

## 2015-06-18 ENCOUNTER — Encounter: Payer: Medicare Other | Admitting: Obstetrics and Gynecology

## 2015-06-23 ENCOUNTER — Encounter: Payer: Self-pay | Admitting: Physical Therapy

## 2015-06-23 ENCOUNTER — Ambulatory Visit: Payer: Medicare Other | Admitting: Physical Therapy

## 2015-06-23 DIAGNOSIS — M6281 Muscle weakness (generalized): Secondary | ICD-10-CM

## 2015-06-23 DIAGNOSIS — M545 Low back pain, unspecified: Secondary | ICD-10-CM

## 2015-06-24 NOTE — Therapy (Signed)
[facility] [address]. [place], [place], [address] Phone: [phone]   Fax:  [phone]  Physical Therapy Treatment  Patient Details  Name: Megan Daniels MRN: [id number] Date of Birth: [birth year] No Data Recorded  Encounter Date: 06/23/2015      PT End of Session - 06/23/15 1711    Visit Number 3   Number of Visits 8   Date for PT Re-Evaluation 07/01/15   Authorization - Visit Number 3   Authorization - Number of Visits 10   PT Start Time 1140   PT Stop Time 0938   PT Time Calculation (min) 33 min   Activity Tolerance Patient tolerated treatment well;No increased pain   Behavior During Therapy [facility] for tasks assessed/performed      Past Medical History  Diagnosis Date  . Stress incontinence   . Palpitations     takes Metoprolol daily  . Hypertension     takes Losartan daily  . Sarcoidosis ([place])   . Asthma     Symbicort as needed  . URI (upper respiratory infection)     treated self with Mucinex and no fever  . Seasonal allergies     takes Claritin daily  . Pneumonia     hx of-early 2000's  . History of bronchitis   . Weakness     numbness and tingling in both legs  . Foot drop     right  . Arthritis   . Joint pain   . Chronic back pain     spondylolisthesis/stenosis  . Vitamin D deficiency   . History of hiatal hernia   . GERD (gastroesophageal reflux disease)     takes Prevacid daily  . Diverticulosis   . History of colon polyps   . Urinary urgency   . Chronic UTI     was treated for 3 months with Macrobid and in Dec everything was fine  . Constipation   . Chalazion of left eye   . PONV (postoperative nausea and vomiting)     extreme nausea and vomiting -none last surgery 10/29/14  . Sleep apnea     cpap 10 yrs  . COPD (chronic obstructive pulmonary disease) ([place])   . Shortness of breath dyspnea   . Elevated serum creatinine     with nsaids  . Cancer [facility])     skin    Past Surgical  History  Procedure Laterality Date  . Tonsillectomy    . Abdominal hysterectomy    . Cervical fusion    . Nissan fundoplication    . Bunionectomy Bilateral   . Cataract surgery Bilateral   . Back surgery    . Carpal tunnel release Right     x 2  . Left middle finger surgery      pin- arthritic cyst  . Colonoscopy    . Esophagogastroduodenoscopy    . Lumbar laminectomy/decompression microdiscectomy Right 05/04/2015    Procedure: RIGHT LUMBAR TWO-THREE/THREE-FOUR LAMINECTOMY/DECOMPRESSION ;  Surgeon: Newman Pies, MD;  Location: [place] [facility];  Service: Neurosurgery;  Laterality: Right;  Right L23 L34 laminectomy and foraminotomy  . Breast biopsy Right 1996    EXCISIONAL - NEG    There were no vitals filed for this visit.  Visit Diagnosis:  Midline low back pain without sciatica  Muscle weakness      Subjective Assessment - 06/23/15 1710    Subjective Pt reports back soreness with movement and reaching activities today. Pt reports no pain at rest  and that she was able to tolerate the drive and [place] without increased complaints of pain.    Pertinent History see past PT notes and MD surgical report    Limitations Standing;Walking;Lifting;Sitting   How long can you sit comfortably? 15-20 minutes   How long can you stand comfortably? 10-15 mins   How long can you walk comfortably? 10 mins   Diagnostic tests XRAYS   Patient Stated Goals Increase core strength/ pain-free work-related tasks/ being able to sit for an hour/ ability to pick up 30# granddaughter/return to walking    Currently in Pain? No/denies         OBJECTIVE: There ex: Squats with 4# overhead press 10 x 3. Lunges with weight 4 x in the hallway. Step ups with weighted overhead press 10 x 2 each leg. Stepping over obstacles with 9.5 # box. Floor to waist box lifts and ambulation with box (cuing for proper foot placement required every time) 10 x 2. Push pull across // bars to simulate rolling a  patient in bed x 15 each with 4# weights (cuing needed to protect back and have proper TrA contraction).   Pt response to Tx for medical necessity: Pt benefits from strength training to return to work in 2 weeks. Pt continues to be limited by her hesitant and fatigue.           PT Long Term Goals - 06/04/15 4098    PT LONG TERM GOAL #1   Title Pt will decrease ODI score from 48% to 20% in order to improve functional mobility.   Baseline 48% on 9/28    Time 4   Period Weeks   Status New   PT LONG TERM GOAL #2   Title Pt will be able to ambulate on all terrians with no assistive device in order to safely return to work.    Baseline currently using a SPC for outdoor ambulation    Time 4   Period Weeks   Status New   PT LONG TERM GOAL #3   Title Pt will be independent in home exercise program in order to tolerate sitting for 30 mins without increased complaints of pain.   Baseline currently only able to sit or drive for 11-91 mins.   Time 4   Period Weeks   Status New   PT LONG TERM GOAL #4   Title Pt will demonstrate proper lifting techniques with 30# consistently and safely in order to be able to pick up her granddaughter.    Baseline able to do 7# on 9/28   Time 4   Period Weeks   Status New   PT LONG TERM GOAL #5   Title Pt will return to walking program in order to return to prior level of function.    Time 4   Period Weeks   Status New            Plan - 06/23/15 1711    Clinical Impression Statement Pt progressing with work related tasks well; pt is hestitant to perform much lifting or weight resisitive cross body lifting. Pt requires constant verbal cues with squatting to perform with proper base of support and technique. Pt is able to maintain tight core contraction while performing all lifting/squatting and various work related tasks. Pt requires cuing and encouragement to perform all exercises without hesitation.    Pt will benefit from skilled therapeutic  intervention in order to improve on the following deficits Decreased endurance;Improper body  mechanics;Impaired flexibility;Decreased strength;Decreased mobility;Pain;Decreased range of motion   Rehab Potential Good   Clinical Impairments Affecting Rehab Potential previous back surgeries.    PT Frequency 2x / week   PT Duration 4 weeks   PT Treatment/Interventions ADLs/Self Care Home Management;Cryotherapy;Moist Heat;Balance training;Therapeutic exercise;Therapeutic activities;Functional mobility training;Stair training;Gait training;Neuromuscular re-education;Manual techniques;Passive range of motion;Scar mobilization   PT Next Visit Plan progress core program/progress box lifts and functional work tasks    PT Home Exercise Plan continue with core stability and HEP from HHPT. encouraged pt to perform stretching daily.         Problem List Patient Active Problem List   Diagnosis Date Noted  . Lumbar stenosis 05/04/2015  . Spondylolisthesis of lumbar region 10/29/2014  . BP (high blood pressure) 03/13/2014  . HLD (hyperlipidemia) 03/13/2014  . Awareness of heartbeats 03/13/2014  . Besnier-Boeck disease ([place]) 03/13/2014  . Airway hyperreactivity 02/15/2013    Lavone Neri, SPT 06/24/2015, 7:53 AM  [facility] [address]. [place], [place], [address] Phone: [phone]   Fax:  [phone]  Name: Megan Daniels MRN: [id number] Date of Birth: 1951/06/19

## 2015-06-25 ENCOUNTER — Ambulatory Visit: Payer: Medicare Other | Admitting: Physical Therapy

## 2015-06-25 ENCOUNTER — Encounter: Payer: Self-pay | Admitting: Physical Therapy

## 2015-06-25 DIAGNOSIS — M545 Low back pain, unspecified: Secondary | ICD-10-CM

## 2015-06-25 DIAGNOSIS — M6281 Muscle weakness (generalized): Secondary | ICD-10-CM

## 2015-06-25 NOTE — Therapy (Signed)
[place] [facility] [address] [place], [place], [address] Phone: [phone]   Fax:  [phone]  Physical Therapy Treatment  Patient Details  Name: Megan Daniels MRN: [id number] Date of Birth: 07/19/1951 No Data Recorded  Encounter Date: 06/25/2015      PT End of Session - 06/25/15 1241    Visit Number 4   Number of Visits 8   Date for PT Re-Evaluation 07/01/15   Authorization - Visit Number 4   Authorization - Number of Visits 10   PT Start Time 3762   PT Stop Time 8315   PT Time Calculation (min) 40 min   Activity Tolerance Patient tolerated treatment well;No increased pain   Behavior During Therapy [facility] for tasks assessed/performed      Past Medical History  Diagnosis Date  . Stress incontinence   . Palpitations     takes Metoprolol daily  . Hypertension     takes Losartan daily  . Sarcoidosis ([place])   . Asthma     Symbicort as needed  . URI (upper respiratory infection)     treated self with Mucinex and no fever  . Seasonal allergies     takes Claritin daily  . Pneumonia     hx of-early 2000's  . History of bronchitis   . Weakness     numbness and tingling in both legs  . Foot drop     right  . Arthritis   . Joint pain   . Chronic back pain     spondylolisthesis/stenosis  . Vitamin D deficiency   . History of hiatal hernia   . GERD (gastroesophageal reflux disease)     takes Prevacid daily  . Diverticulosis   . History of colon polyps   . Urinary urgency   . Chronic UTI     was treated for 3 months with Macrobid and in Dec everything was fine  . Constipation   . Chalazion of left eye   . PONV (postoperative nausea and vomiting)     extreme nausea and vomiting -none last surgery 10/29/14  . Sleep apnea     cpap 10 yrs  . COPD (chronic obstructive pulmonary disease) ([place])   . Shortness of breath dyspnea   . Elevated serum creatinine     with nsaids  . Cancer [facility]     skin    Past Surgical  History  Procedure Laterality Date  . Tonsillectomy    . Abdominal hysterectomy    . Cervical fusion    . Nissan fundoplication    . Bunionectomy Bilateral   . Cataract surgery Bilateral   . Back surgery    . Carpal tunnel release Right     x 2  . Left middle finger surgery      pin- arthritic cyst  . Colonoscopy    . Esophagogastroduodenoscopy    . Lumbar laminectomy/decompression microdiscectomy Right 05/04/2015    Procedure: RIGHT LUMBAR TWO-THREE/THREE-FOUR LAMINECTOMY/DECOMPRESSION ;  Surgeon: Newman Pies, MD;  Location: [place] NEUR[facility] ORS;  Service: Neurosurgery;  Laterality: Right;  Right L23 L34 laminectomy and foraminotomy  . Breast biopsy Right 1996    EXCISIONAL - NEG    There were no vitals filed for this visit.  Visit Diagnosis:  Midline low back pain without sciatica  Muscle weakness      Subjective Assessment - 06/25/15 1240    Subjective Pt reports soreness in low back but with no pain. 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    Pertinent History see past PT notes and MD surgical report    Limitations Standing;Walking;Lifting;Sitting   How long can you sit comfortably? 15-20 minutes   How long can you stand comfortably? 10-15 mins   How long can you walk comfortably? 10 mins   Diagnostic tests XRAYS   Patient Stated Goals Increase core strength/ pain-free work-related tasks/ being able to sit for an hour/ ability to pick up 30# granddaughter/return to walking    Currently in Pain? No/denies       OBJECTIVE: There ex: Core training: TrA contraction on green therapy ball: marching/straight leg raise/alternating arms and legs/pelvic tilts 20 x 2 each. 14# box carry stepping over obstacles. Squats with green therapy ball 15 x 2 on the wall. Gait with 3# dumbbells for endurance and carrying objects into patient's home.   Pt response to Tx for medical necessity: Pt benefits from core stability and cuing for upcoming  return to work. Pt is hesitant with work related tasks and requires encouragement to perform correctly.           PT Long Term Goals - 06/04/15 7425    PT LONG TERM GOAL #1   Title Pt will decrease ODI score from 48% to 20% in order to improve functional mobility.   Baseline 48% on 9/28    Time 4   Period Weeks   Status New   PT LONG TERM GOAL #2   Title Pt will be able to ambulate on all terrians with no assistive device in order to safely return to work.    Baseline currently using a SPC for outdoor ambulation    Time 4   Period Weeks   Status New   PT LONG TERM GOAL #3   Title Pt will be independent in home exercise program in order to tolerate sitting for 30 mins without increased complaints of pain.   Baseline currently only able to sit or drive for 95-63 mins.   Time 4   Period Weeks   Status New   PT LONG TERM GOAL #4   Title Pt will demonstrate proper lifting techniques with 30# consistently and safely in order to be able to pick up her granddaughter.    Baseline able to do 7# on 9/28   Time 4   Period Weeks   Status New   PT LONG TERM GOAL #5   Title Pt will return to walking program in order to return to prior level of function.    Time 4   Period Weeks   Status New               Plan - 06/25/15 1242    Clinical Impression Statement Pt guarded with motion secondary to fearful of pain. Pt able to move and perform all exercises related to work demands but requires encouragment. Pt requires constant verbal cues to keep her core tight and watch her body mechanics with work related tasks.    Pt will benefit from skilled therapeutic intervention in order to improve on the following deficits Decreased endurance;Improper body mechanics;Impaired flexibility;Decreased strength;Decreased mobility;Pain;Decreased range of motion   Rehab Potential Good   Clinical Impairments Affecting Rehab Potential previous back surgeries.    PT Frequency 2x / week   PT Duration 4  weeks   PT Treatment/Interventions ADLs/Self Care Home Management;Cryotherapy;Moist Heat;Balance training;Therapeutic exercise;Therapeutic activities;Functional mobility training;Stair training;Gait training;Neuromuscular re-education;Manual techniques;Passive range of motion;Scar mobilization  PT Next Visit Plan progress core program/progress box lifts and functional work tasks    PT Pearson continue with core stability and HEP from HHPT. encouraged pt to perform stretching daily.    Consulted and Agree with Plan of Care Patient        Problem List Patient Active Problem List   Diagnosis Date Noted  . Lumbar stenosis 05/04/2015  . Spondylolisthesis of lumbar region 10/29/2014  . BP (high blood pressure) 03/13/2014  . HLD (hyperlipidemia) 03/13/2014  . Awareness of heartbeats 03/13/2014  . Besnier-Boeck disease ([place]) 03/13/2014  . Airway hyperreactivity 02/15/2013    Megan Daniels, Megan Daniels 06/25/2015, 12:44 PM  [place] [address] [place], [place], [address] Phone: [phone]   Fax:  [phone]  Name: Megan Daniels MRN: [id number] Date of Birth: Nov 20, 1950

## 2015-06-30 ENCOUNTER — Ambulatory Visit: Payer: Medicare Other | Admitting: Physical Therapy

## 2015-06-30 DIAGNOSIS — M6281 Muscle weakness (generalized): Secondary | ICD-10-CM

## 2015-06-30 DIAGNOSIS — M545 Low back pain, unspecified: Secondary | ICD-10-CM

## 2015-07-01 ENCOUNTER — Encounter: Payer: Self-pay | Admitting: Physical Therapy

## 2015-07-01 NOTE — Therapy (Signed)
[facility] [address] [place], [place], [address] Phone: [phone]   Fax:  [phone]  Physical Therapy Treatment  Patient Details  Name: Megan Daniels MRN: [id number] Date of Birth: 07/09/1951 No Data Recorded  Encounter Date: 06/30/2015      PT End of Session - 07/01/15 1055    Visit Number 5   Number of Visits 8   Date for PT Re-Evaluation 07/01/15   Authorization - Visit Number 5   Authorization - Number of Visits 10   PT Start Time 1122   PT Stop Time 1202   PT Time Calculation (min) 40 min   Activity Tolerance Patient tolerated treatment well;No increased pain   Behavior During Therapy [facility] for tasks assessed/performed      Past Medical History  Diagnosis Date  . Stress incontinence   . Palpitations     takes Metoprolol daily  . Hypertension     takes Losartan daily  . Sarcoidosis ([place])   . Asthma     Symbicort as needed  . URI (upper respiratory infection)     treated self with Mucinex and no fever  . Seasonal allergies     takes Claritin daily  . Pneumonia     hx of-early 2000's  . History of bronchitis   . Weakness     numbness and tingling in both legs  . Foot drop     right  . Arthritis   . Joint pain   . Chronic back pain     spondylolisthesis/stenosis  . Vitamin D deficiency   . History of hiatal hernia   . GERD (gastroesophageal reflux disease)     takes Prevacid daily  . Diverticulosis   . History of colon polyps   . Urinary urgency   . Chronic UTI     was treated for 3 months with Macrobid and in Dec everything was fine  . Constipation   . Chalazion of left eye   . PONV (postoperative nausea and vomiting)     extreme nausea and vomiting -none last surgery 10/29/14  . Sleep apnea     cpap 10 yrs  . COPD (chronic obstructive pulmonary disease) ([place])   . Shortness of breath dyspnea   . Elevated serum creatinine     with nsaids  . Cancer [facility])     skin    Past Surgical  History  Procedure Laterality Date  . Tonsillectomy    . Abdominal hysterectomy    . Cervical fusion    . Nissan fundoplication    . Bunionectomy Bilateral   . Cataract surgery Bilateral   . Back surgery    . Carpal tunnel release Right     x 2  . Left middle finger surgery      pin- arthritic cyst  . Colonoscopy    . Esophagogastroduodenoscopy    . Lumbar laminectomy/decompression microdiscectomy Right 05/04/2015    Procedure: RIGHT LUMBAR TWO-THREE/THREE-FOUR LAMINECTOMY/DECOMPRESSION ;  Surgeon: Newman Pies, MD;  Location: [place] NEURO ORS;  Service: Neurosurgery;  Laterality: Right;  Right L23 L34 laminectomy and foraminotomy  . Breast biopsy Right 1996    EXCISIONAL - NEG    There were no vitals filed for this visit.  Visit Diagnosis:  Midline low back pain without sciatica  Muscle weakness      Subjective Assessment - 07/01/15 1054    Subjective Pt reports no new complaints seen last PT tx session. Pt reports plans to return to  work next week on half duty.    Pertinent History see past PT notes and MD surgical report    Limitations Standing;Walking;Lifting;Sitting   How long can you sit comfortably? 15-20 minutes   How long can you stand comfortably? 10-15 mins   How long can you walk comfortably? 10 mins   Diagnostic tests XRAYS   Patient Stated Goals Increase core strength/ pain-free work-related tasks/ being able to sit for an hour/ ability to pick up 30# granddaughter/return to walking    Currently in Pain? No/denies        OBJECTIVE: There ex: Push/pull sled 50# x 4 for 35' at a time. Pt required rest breaks between each rep. Verbal cuing required for proper back alignment and to decrease flexion with push and extension with pull. 3# above head lifts x 10 to maintain proper technique for lifting a patients leg. Box carry with 15# x 4 35' walks to facilitate return to carrying objects into homes with work. Pt demonstrate good carrying technique close to her body  but required a rest break between sets. Floor to shelf lifts with 15# x 10 with proper technique from waist up; minimal verbal cues required for floor lifting set-up. Floor to waist lifts x 10 with 15#.   Pt response to Tx for medical necessity: Pt benefits from functional strengthening in order to transition back to work. Ps is aware of proper body mechanics and requires minimal cuing to maintain form with lifting and positioning objects.        PT Long Term Goals - 06/04/15 7616    PT LONG TERM GOAL #1   Title Pt will decrease ODI score from 48% to 20% in order to improve functional mobility.   Baseline 48% on 9/28    Time 4   Period Weeks   Status New   PT LONG TERM GOAL #2   Title Pt will be able to ambulate on all terrians with no assistive device in order to safely return to work.    Baseline currently using a SPC for outdoor ambulation    Time 4   Period Weeks   Status New   PT LONG TERM GOAL #3   Title Pt will be independent in home exercise program in order to tolerate sitting for 30 mins without increased complaints of pain.   Baseline currently only able to sit or drive for 07-37 mins.   Time 4   Period Weeks   Status New   PT LONG TERM GOAL #4   Title Pt will demonstrate proper lifting techniques with 30# consistently and safely in order to be able to pick up her granddaughter.    Baseline able to do 7# on 9/28   Time 4   Period Weeks   Status New   PT LONG TERM GOAL #5   Title Pt will return to walking program in order to return to prior level of function.    Time 4   Period Weeks   Status New               Plan - 07/01/15 1056    Clinical Impression Statement Pt is less guarded compared to last session with movevment. Pt performed floor to waist transfers with minimal verbal cuing for proper set up. Pt is able to perform waist to shoulder height lifting with moderate cuing for form. Pt reports difficulty getting up from the floor without an object to pull  up on. Pt is able to demonstrate  proper floor to standing transfers with cuing for reassurance. Pt educated on use of using back brace if needed to suport transition back to work.   Pt will benefit from skilled therapeutic intervention in order to improve on the following deficits Decreased endurance;Improper body mechanics;Impaired flexibility;Decreased strength;Decreased mobility;Pain;Decreased range of motion   Rehab Potential Good   Clinical Impairments Affecting Rehab Potential previous back surgeries.    PT Frequency 2x / week   PT Duration 4 weeks   PT Treatment/Interventions ADLs/Self Care Home Management;Cryotherapy;Moist Heat;Balance training;Therapeutic exercise;Therapeutic activities;Functional mobility training;Stair training;Gait training;Neuromuscular re-education;Manual techniques;Passive range of motion;Scar mobilization   PT Next Visit Plan progress core program/progress box lifts and functional work tasks    PT Home Exercise Plan continue with core stability and HEP from HHPT. encouraged pt to perform stretching daily.    Consulted and Agree with Plan of Care Patient        Problem List Patient Active Problem List   Diagnosis Date Noted  . Lumbar stenosis 05/04/2015  . Spondylolisthesis of lumbar region 10/29/2014  . BP (high blood pressure) 03/13/2014  . HLD (hyperlipidemia) 03/13/2014  . Awareness of heartbeats 03/13/2014  . Besnier-Boeck disease ([place]) 03/13/2014  . Airway hyperreactivity 02/15/2013    Lavone Neri, SPT 07/01/2015, 11:03 AM  [place] [facility] [address] [place], [place], [address] Phone: [phone]   Fax:  [phone]  Name: CHRISHONDA HESCH MRN: [id number] Date of Birth: 04/04/1951

## 2015-07-02 ENCOUNTER — Ambulatory Visit: Payer: Medicare Other | Admitting: Physical Therapy

## 2015-07-02 ENCOUNTER — Encounter: Payer: Self-pay | Admitting: Physical Therapy

## 2015-07-02 ENCOUNTER — Encounter: Payer: Self-pay | Admitting: Obstetrics and Gynecology

## 2015-07-02 ENCOUNTER — Ambulatory Visit (INDEPENDENT_AMBULATORY_CARE_PROVIDER_SITE_OTHER): Payer: Medicare Other | Admitting: Obstetrics and Gynecology

## 2015-07-02 VITALS — BP 126/75 | HR 71 | Ht 63.0 in | Wt 191.3 lb

## 2015-07-02 DIAGNOSIS — Z01419 Encounter for gynecological examination (general) (routine) without abnormal findings: Secondary | ICD-10-CM | POA: Diagnosis not present

## 2015-07-02 DIAGNOSIS — Z0000 Encounter for general adult medical examination without abnormal findings: Secondary | ICD-10-CM

## 2015-07-02 DIAGNOSIS — Z90710 Acquired absence of both cervix and uterus: Secondary | ICD-10-CM | POA: Diagnosis not present

## 2015-07-02 DIAGNOSIS — Z1211 Encounter for screening for malignant neoplasm of colon: Secondary | ICD-10-CM | POA: Diagnosis not present

## 2015-07-02 DIAGNOSIS — M6281 Muscle weakness (generalized): Secondary | ICD-10-CM

## 2015-07-02 DIAGNOSIS — Z803 Family history of malignant neoplasm of breast: Secondary | ICD-10-CM | POA: Insufficient documentation

## 2015-07-02 DIAGNOSIS — M545 Low back pain, unspecified: Secondary | ICD-10-CM
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
## 2015-07-02 NOTE — Patient Instructions (Signed)
1.  Pap smear completed today. 2.  Mammogram results.-BI-RADS 1, which is normal.  Patient has category 3, (moderately dense breast tissue); 3-D mammogram was performed.  Repeat in 1 year. 3.  Family history of breast cancer;; literature regarding my risk assessment for genetic susceptibility to breast/ovarian cancer is given. 4.  Return in 1 year.

## 2015-07-02 NOTE — Therapy (Signed)
[facility] [address] [place], [place], [address] Phone: [phone]   Fax:  [phone]  Physical Therapy Treatment  Patient Details  Name: Megan Daniels MRN: [id number] Date of Birth: 06/11/1951 No Data Recorded  Encounter Date: 07/02/2015      PT End of Session - 07/02/15 2158    Visit Number 6   Number of Visits 16   Date for PT Re-Evaluation 07/29/15   Authorization - Visit Number 6   Authorization - Number of Visits 16   PT Start Time 7591   PT Stop Time 1135   PT Time Calculation (min) 55 min   Activity Tolerance Patient tolerated treatment well;No increased pain   Behavior During Therapy [facility] for tasks assessed/performed      Past Medical History  Diagnosis Date  . Stress incontinence   . Palpitations     takes Metoprolol daily  . Hypertension     takes Losartan daily  . Sarcoidosis ([place])   . Asthma     Symbicort as needed  . URI (upper respiratory infection)     treated self with Mucinex and no fever  . Seasonal allergies     takes Claritin daily  . Pneumonia     hx of-early 2000's  . History of bronchitis   . Weakness     numbness and tingling in both legs  . Foot drop     right  . Arthritis   . Joint pain   . Chronic back pain     spondylolisthesis/stenosis  . Vitamin D deficiency   . History of hiatal hernia   . GERD (gastroesophageal reflux disease)     takes Prevacid daily  . Diverticulosis   . History of colon polyps   . Urinary urgency   . Chronic UTI     was treated for 3 months with Macrobid and in Dec everything was fine  . Constipation   . Chalazion of left eye   . PONV (postoperative nausea and vomiting)     extreme nausea and vomiting -none last surgery 10/29/14  . Sleep apnea     cpap 10 yrs  . COPD (chronic obstructive pulmonary disease) ([place])   . Shortness of breath dyspnea   . Elevated serum creatinine     with nsaids  . Cancer ([place])     skin  . Acid reflux   .  Genital warts     h/o  . Abnormal Pap smear of cervix     pos hpv-     Past Surgical History  Procedure Laterality Date  . Tonsillectomy    . Abdominal hysterectomy    . Cervical fusion    . Nissan fundoplication    . Bunionectomy Bilateral   . Cataract surgery Bilateral   . Back surgery    . Carpal tunnel release Right     x 2  . Left middle finger surgery      pin- arthritic cyst  . Colonoscopy    . Esophagogastroduodenoscopy    . Lumbar laminectomy/decompression microdiscectomy Right 05/04/2015    Procedure: RIGHT LUMBAR TWO-THREE/THREE-FOUR LAMINECTOMY/DECOMPRESSION ;  Surgeon: Newman Pies, MD;  Location: [facility];  Service: Neurosurgery;  Laterality: Right;  Right L23 L34 laminectomy and foraminotomy  . Breast biopsy Right 1996    EXCISIONAL - NEG    There were no vitals filed for this visit.  Visit Diagnosis:  Midline low back pain without sciatica  Muscle weakness  Subjective Assessment - 07/02/15 2157    Subjective Pt reports minimal back soreness from activity yesterday. Pt reports having no difficulty with steps at her daughter's house early in the week.    Pertinent History see past PT notes and MD surgical report    Limitations Standing;Walking;Lifting;Sitting   How long can you sit comfortably? 15-20 minutes   How long can you stand comfortably? 10-15 mins   How long can you walk comfortably? 10 mins   Diagnostic tests XRAYS   Patient Stated Goals Increase core strength/ pain-free work-related tasks/ being able to sit for an hour/ ability to pick up 30# granddaughter/return to walking    Currently in Pain? No/denies      OBJECTIVE: There ex: step ups onto 12" step alternating leading leg x 15 each leg. Pt able to perform with minimal use of handrail; attempted last 3 without use of handrail and pt unsteady with this. Carrying of 10 pounds over unlevel terrain to simulate returning to pt's homes with her supplies. Pt able to maintain upright  posture and carry the bag on her R shoulder; with fatigue, pt's R shoulder is elevated with upper trap compensation compared to the L. Standing weights with 4# weights: horizontal abduction/tricep extension/biceps/forward raise for anterior deltoid x 20 each. Manual: supine stretching and neural gliding to B hamstring. With R dorsiflexion, pt noted R foot feeling better. Pt educated on performing these at home and the importance of them.   Pt response to Tx for medical necessity: Pt demonstrates proper mechanics with all work related tasks and increased safety awareness. Pt continues to benefit from strengthening and dynamic movements away from her center of gravity in order to safely engage and challenge her core for work related tasks.        PT Long Term Goals - 07/02/15 2200    PT LONG TERM GOAL #1   Title Pt will decrease ODI score from 48% to 20% in order to improve functional mobility.   Baseline 48% on 9/28    Time 4   Period Weeks   Status On-going   PT LONG TERM GOAL #2   Title Pt will be able to ambulate on all terrians with no assistive device in order to safely return to work.    Baseline currently using a SPC for outdoor ambulation    Time 4   Period Weeks   Status Achieved   PT LONG TERM GOAL #3   Title Pt will be independent in home exercise program in order to tolerate sitting for 30 mins without increased complaints of pain.   Baseline currently only able to sit or drive for 82-80 mins.   Time 4   Period Weeks   Status Partially Met   PT LONG TERM GOAL #4   Title Pt will demonstrate proper lifting techniques with 30# consistently and safely in order to be able to pick up her granddaughter.    Baseline able to do 7# on 9/28   Time 4   Period Weeks   Status Not Met   PT LONG TERM GOAL #5   Title Pt will return to walking program in order to return to prior level of function.    Time 4   Period Weeks   Status On-going               Plan - 07/02/15 2158     Clinical Impression Statement Pt demonstrates proper lifting and carrying techniques of 10# bag on her R  shoulder to return to work. Pt is able to manage a 12" step up with minimal use of her UE in order to work her LE strength. Pt is still hesistant about her back with movement but is returning to work on 10/31. Pt educated on importance of maintaining core activation while moving to protect her back.    Pt will benefit from skilled therapeutic intervention in order to improve on the following deficits Decreased endurance;Improper body mechanics;Impaired flexibility;Decreased strength;Decreased mobility;Pain;Decreased range of motion   Rehab Potential Good   Clinical Impairments Affecting Rehab Potential previous back surgeries.    PT Frequency 2x / week   PT Duration 4 weeks   PT Treatment/Interventions ADLs/Self Care Home Management;Cryotherapy;Moist Heat;Balance training;Therapeutic exercise;Therapeutic activities;Functional mobility training;Stair training;Gait training;Neuromuscular re-education;Manual techniques;Passive range of motion;Scar mobilization   PT Next Visit Plan discuss work tasks/Reassessment   PT Home Exercise Plan continue with core stability and HEP from HHPT. encouraged pt to perform stretching daily.    Consulted and Agree with Plan of Care Patient          G-Codes - 07/06/2015 1554    Functional Assessment Tool Used clinical judgement/ Oswestry/ pain   Functional Limitation Changing and maintaining body position   Changing and Maintaining Body Position Current Status [phone]) At least 1 percent but less than 20 percent impaired, limited or restricted   Changing and Maintaining Body Position Goal Status (M0349) 0 percent impaired, limited or restricted      Problem List Patient Active Problem List   Diagnosis Date Noted  . Family history of breast cancer in mother 07-06-2015  . History of total vaginal hysterectomy 07/06/2015  . Lumbar stenosis 05/04/2015  .  Spondylolisthesis of lumbar region 10/29/2014  . BP (high blood pressure) 03/13/2014  . HLD (hyperlipidemia) 03/13/2014  . Awareness of heartbeats 03/13/2014  . 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 Airway hyperreactivity 02/15/2013   Pura Spice, PT, DPT # [id number]   07/03/2015, 3:56 PM  [facility] [address] [place], [place], [address] Phone: [phone]   Fax:  [phone]  Name: Megan Daniels MRN: [id number] Date of Birth: [birth year]

## 2015-07-06 LAB — PAP IG AND HPV HIGH-RISK: PAP Smear Comment: 0

## 2015-07-09 ENCOUNTER — Encounter: Payer: Self-pay | Admitting: Physical Therapy

## 2015-07-09 ENCOUNTER — Ambulatory Visit: Payer: Medicare Other | Attending: Physical Medicine and Rehabilitation | Admitting: Physical Therapy

## 2015-07-09 DIAGNOSIS — M545 Low back pain, unspecified: Secondary | ICD-10-CM

## 2015-07-09 DIAGNOSIS — M6281 Muscle weakness (generalized): Secondary | ICD-10-CM | POA: Insufficient documentation

## 2015-07-09 NOTE — Therapy (Signed)
[place] [facility] [address]. [place], [place], [address] Phone: [phone]   Fax:  [phone]  Physical Therapy Treatment  Patient Details  Name: Megan Daniels MRN: [id number] Date of Birth: October 07, 1950 No Data Recorded  Encounter Date: 07/09/2015      PT End of Session - 07/09/15 1510    Visit Number 7   Number of Visits 16   Date for PT Re-Evaluation 07/29/15   Authorization - Visit Number 7   Authorization - Number of Visits 16   PT Start Time 1426   PT Stop Time 1540   PT Time Calculation (min) 74 min   Activity Tolerance Patient tolerated treatment well;No increased pain   Behavior During Therapy [facility] for tasks assessed/performed      Past Medical History  Diagnosis Date  . Stress incontinence   . Palpitations     takes Metoprolol daily  . Hypertension     takes Losartan daily  . Sarcoidosis (HCC)   . Asthma     Symbicort as needed  . URI (upper respiratory infection)     treated self with Mucinex and no fever  . Seasonal allergies     takes Claritin daily  . Pneumonia     hx of-early 2000's  . History of bronchitis   . Weakness     numbness and tingling in both legs  . Foot drop     right  . Arthritis   . Joint pain   . Chronic back pain     spondylolisthesis/stenosis  . Vitamin D deficiency   . History of hiatal hernia   . GERD (gastroesophageal reflux disease)     takes Prevacid daily  . Diverticulosis   . History of colon polyps   . Urinary urgency   . Chronic UTI     was treated for 3 months with Macrobid and in Dec everything was fine  . Constipation   . Chalazion of left eye   . PONV (postoperative nausea and vomiting)     extreme nausea and vomiting -none last surgery 10/29/14  . Sleep apnea     cpap 10 yrs  . COPD (chronic obstructive pulmonary disease) (HCC)   . Shortness of breath dyspnea   . Elevated serum creatinine     with nsaids  . Cancer (HCC)     skin  . Acid reflux   .  Genital warts     h/o  . Abnormal Pap smear of cervix     pos hpv-     Past Surgical History  Procedure Laterality Date  . Tonsillectomy    . Abdominal hysterectomy    . Cervical fusion    . Nissan fundoplication    . Bunionectomy Bilateral   . Cataract surgery Bilateral   . Back surgery    . Carpal tunnel release Right     x 2  . Left middle finger surgery      pin- arthritic cyst  . Colonoscopy    . Esophagogastroduodenoscopy    . Lumbar laminectomy/decompression microdiscectomy Right 05/04/2015    Procedure: RIGHT LUMBAR TWO-THREE/THREE-FOUR LAMINECTOMY/DECOMPRESSION ;  Surgeon: Tressie Stalker, MD;  Location: [facility];  Service: Neurosurgery;  Laterality: Right;  Right L23 L34 laminectomy and foraminotomy  . Breast biopsy Right 1996    EXCISIONAL - NEG    There were no vitals filed for this visit.  Visit Diagnosis:  Midline low back pain without sciatica  Muscle weakness  Subjective Assessment - 07/09/15 1507    Subjective Pt reports soreness after returning to work on Monday. Pt reports doing stairs 3 times today and having a burning pain after doing the steps. Pt reports burning pain and numbness in her B feet.    Pertinent History see past PT notes and MD surgical report    Limitations Standing;Walking;Lifting;Sitting   How long can you sit comfortably? 15-20 minutes   How long can you stand comfortably? 10-15 mins   How long can you walk comfortably? 10 mins   Diagnostic tests XRAYS   Patient Stated Goals Increase core strength/ pain-free work-related tasks/ being able to sit for an hour/ ability to pick up 30# granddaughter/return to walking    Currently in Pain? No/denies      OBJECTIVE: ODI: 10%, self-perceived minimal disability.  Warm up: heat with 4 layers (no charge). Manual: B LE stretching focusing on piriformis and proximal/distal hamstrings to decrease pull on low back. Tightness noted in R hamstring as compared to the L hamstring. Scar  tissue massage with no thickness or tenderness noted. There ex: Supine white therapy ball bridging on white therapy ball x 30. Supine white therapy ball knees to chest x 30. Seated neural glides for sciatic nerve to address B foot burning sensation with no relief except for unweighting the feet. 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  Pt response to tx for medical necessity: Pt benefits from core stability to decrease fear with movement and return to work. Pt capable of maintain TrA contraction but requires constant encouragement to maintain with functional tasks.        PT Education - 07/09/15 1510    Education provided Yes   Education Details Pt given entire core stability packet and educated on DOMS with return to work activity.    Person(s) Educated Patient   Methods Explanation;Handout   Comprehension Verbalized understanding             PT Long Term Goals - 07/02/15 2200    PT LONG TERM GOAL #1   Title Pt will decrease ODI score from 48% to 20% in order to improve functional mobility.   Baseline 48% on 9/28    Time 4   Period Weeks   Status On-going   PT LONG TERM GOAL #2   Title Pt will be able to ambulate on all terrians with no assistive device in order to safely return to work.    Baseline currently using a SPC for outdoor ambulation    Time 4   Period Weeks   Status Achieved   PT LONG TERM GOAL #3   Title Pt will be independent in home exercise program in order to tolerate sitting for 30 mins without increased complaints of pain.   Baseline currently only able to sit or drive for 25-95 mins.   Time 4   Period Weeks   Status Partially Met   PT LONG TERM GOAL #4   Title Pt will demonstrate proper lifting techniques with 30# consistently and safely in order to be able to pick up her granddaughter.    Baseline able to do 7# on 9/28   Time 4   Period Weeks   Status Not Met   PT LONG TERM  GOAL #5   Title Pt will return to walking program in order to return to prior level of function.    Time 4   Period Weeks  Status On-going               Plan - 07/09/15 1739    Clinical Impression Statement Pt found relief from back soreness with heat. Pt benefits from stretching to increase B hamstring flexibility. Pt has increased c/o a burning sensation in her low back; no problems noted with incision or increased inflammation in her low back. Pt demonstrates proper technique with stairs at PT although they bothered her back earlier in the day. Pt demonstrates the ability to keep a constant TrA contraction. Pt requires moderate verbal cuing and encouragment to perform exercises. ODI: 10% self perceived minimal disability.   Pt will benefit from skilled therapeutic intervention in order to improve on the following deficits Decreased endurance;Improper body mechanics;Impaired flexibility;Decreased strength;Decreased mobility;Pain;Decreased range of motion   Rehab Potential Good   Clinical Impairments Affecting Rehab Potential previous back surgeries.    PT Frequency 2x / week   PT Duration 4 weeks   PT Treatment/Interventions ADLs/Self Care Home Management;Cryotherapy;Moist Heat;Balance training;Therapeutic exercise;Therapeutic activities;Functional mobility training;Stair training;Gait training;Neuromuscular re-education;Manual techniques;Passive range of motion;Scar mobilization   PT Next Visit Plan Core stability on ball/discuss work tasks   PT Home Exercise Plan continue with core stability and HEP from HHPT. encouraged pt to perform stretching daily.    Consulted and Agree with Plan of Care Patient        Problem List Patient Active Problem List   Diagnosis Date Noted  . Family history of breast cancer in mother 07/02/2015  . History of total vaginal hysterectomy 07/02/2015  . Lumbar stenosis 05/04/2015  . Spondylolisthesis of lumbar region 10/29/2014  . BP (high blood  pressure) 03/13/2014  . HLD (hyperlipidemia) 03/13/2014  . Awareness of heartbeats 03/13/2014  . 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 Sarcoidosis ([place]) 03/13/2014  . Airway hyperreactivity 02/15/2013    Lavone Neri, SPT 07/09/2015, 5:42 PM  [place] [facility] [address] [place], [place], [address] Phone: [phone]   Fax:  [phone]  Name: Megan Daniels MRN: [id number] Date of Birth: 12-20-1950

## 2015-07-13 ENCOUNTER — Ambulatory Visit: Payer: Medicare Other | Admitting: Physical Therapy

## 2015-07-13 ENCOUNTER — Encounter: Payer: Self-pay | Admitting: Physical Therapy

## 2015-07-13 DIAGNOSIS — M545 Low back pain, unspecified: Secondary | ICD-10-CM

## 2015-07-13 DIAGNOSIS — M6281 Muscle weakness (generalized): Secondary | ICD-10-CM

## 2015-07-13 NOTE — Therapy (Signed)
[place] [facility] [address] [place], [place], [address] Phone: [phone]   Fax:  [phone]  Physical Therapy Treatment  Patient Details  Name: Megan Daniels MRN: [id number] Date of Birth: 1951/08/29 No Data Recorded  Encounter Date: 07/13/2015      PT End of Session - 07/13/15 1700    Visit Number 8   Number of Visits 16   Date for PT Re-Evaluation 07/29/15   Authorization - Visit Number 8   Authorization - Number of Visits 16   PT Start Time 0321   PT Stop Time 2248   PT Time Calculation (min) 35 min   Activity Tolerance Patient tolerated treatment well;No increased pain   Behavior During Therapy [facility] for tasks assessed/performed      Past Medical History  Diagnosis Date  . Stress incontinence   . Palpitations     takes Metoprolol daily  . Hypertension     takes Losartan daily  . Sarcoidosis ([place])   . Asthma     Symbicort as needed  . URI (upper respiratory infection)     treated self with Mucinex and no fever  . Seasonal allergies     takes Claritin daily  . Pneumonia     hx of-early 2000's  . History of bronchitis   . Weakness     numbness and tingling in both legs  . Foot drop     right  . Arthritis   . Joint pain   . Chronic back pain     spondylolisthesis/stenosis  . Vitamin D deficiency   . History of hiatal hernia   . GERD (gastroesophageal reflux disease)     takes Prevacid daily  . Diverticulosis   . History of colon polyps   . Urinary urgency   . Chronic UTI     was treated for 3 months with Macrobid and in Dec everything was fine  . Constipation   . Chalazion of left eye   . PONV (postoperative nausea and vomiting)     extreme nausea and vomiting -none last surgery 10/29/14  . Sleep apnea     cpap 10 yrs  . COPD (chronic obstructive pulmonary disease) ([place])   . Shortness of breath dyspnea   . Elevated serum creatinine     with nsaids  . Cancer ([place])     skin  . Acid reflux   .  Genital warts     h/o  . Abnormal Pap smear of cervix     pos hpv-     Past Surgical History  Procedure Laterality Date  . Tonsillectomy    . Abdominal hysterectomy    . Cervical fusion    . Nissan fundoplication    . Bunionectomy Bilateral   . Cataract surgery Bilateral   . Back surgery    . Carpal tunnel release Right     x 2  . Left middle finger surgery      pin- arthritic cyst  . Colonoscopy    . Esophagogastroduodenoscopy    . Lumbar laminectomy/decompression microdiscectomy Right 05/04/2015    Procedure: RIGHT LUMBAR TWO-THREE/THREE-FOUR LAMINECTOMY/DECOMPRESSION ;  Surgeon: Newman Pies, MD;  Location: [place];  Service: Neurosurgery;  Laterality: Right;  Right L23 L34 laminectomy and foraminotomy  . Breast biopsy Right 1996    EXCISIONAL - NEG    There were no vitals filed for this visit.  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 Pt reports having difficulty with bending to change bandages. Pt states she had back soreness over the weekend.    Pertinent History see past PT notes and MD surgical report    Limitations Standing;Walking;Lifting;Sitting   How long can you sit comfortably? 15-20 minutes   How long can you stand comfortably? 10-15 mins   How long can you walk comfortably? 10 mins   Diagnostic tests XRAYS   Patient Stated Goals Increase core strength/ pain-free work-related tasks/ being able to sit for an hour/ ability to pick up 30# granddaughter/return to walking    Currently in Pain? No/denies     OBJECTIVE: Manual: B LE stretching on proximal hamstrings, piriformis and hip flexors. 9 mins. No complaints of increased pain or N/T in R LE with stretching. There ex: Seated on green therapy ball: marching/straight leg raises 10 x 2 each leg. Pt demonstrates mild postural  compensations with kyphotic posture but corrects with verbal cues. Alternating arms and legs 20 x 2. Pt has decreased ability to hold TrA contraction on ball with alternating as compared to LE movements only. Nustep L7 5 mins (no charge, cool down).   Pt response to tx for medical necessity: Pt benefits from TrA contraction/core stability to return to work with proper mechanics. Pt reports no increased complaints of pain with progression in core stability from supine to seated on therapy ball.       PT Long Term Goals - 07/02/15 2200    PT LONG TERM GOAL #1   Title Pt will decrease ODI score from 48% to 20% in order to improve functional mobility.   Baseline 48% on 9/28    Time 4   Period Weeks   Status On-going   PT LONG TERM GOAL #2   Title Pt will be able to ambulate on all terrians with no assistive device in order to safely return to work.    Baseline currently using a SPC for outdoor ambulation    Time 4   Period Weeks   Status Achieved   PT LONG TERM GOAL #3   Title Pt will be independent in home exercise program in order to tolerate sitting for 30 mins without increased complaints of pain.   Baseline currently only able to sit or drive for 32-44 mins.   Time 4   Period Weeks   Status Partially Met   PT LONG TERM GOAL #4   Title Pt will demonstrate proper lifting techniques with 30# consistently and safely in order to be able to pick up her granddaughter.    Baseline able to do 7# on 9/28   Time 4   Period Weeks   Status Not Met   PT LONG TERM GOAL #5   Title Pt will return to walking program in order to return to prior level of function.    Time 4   Period Weeks   Status On-going            Plan - 07/13/15 1701    Clinical Impression Statement Pt reports relief from symptoms with extended posture. Pt demonstrates B hamstring tightness with stretching and finds relief in R foot numbness with passive SLR stretch. Pt is able to maintain TrA contraction on the ball, but  has difficulty with combined motions. Pt reports no discomfort with marching and straight leg raises on the ball, but reports minor  discomfort with lateral pelvic tilts. Pt ambulates with fluid two point gait pattern.    Pt will benefit from skilled therapeutic intervention in order to improve on the following deficits Decreased endurance;Improper body mechanics;Impaired flexibility;Decreased strength;Decreased mobility;Pain;Decreased range of motion   Rehab Potential Good   Clinical Impairments Affecting Rehab Potential previous back surgeries.    PT Frequency 2x / week   PT Duration 4 weeks   PT Treatment/Interventions ADLs/Self Care Home Management;Cryotherapy;Moist Heat;Balance training;Therapeutic exercise;Therapeutic activities;Functional mobility training;Stair training;Gait training;Neuromuscular re-education;Manual techniques;Passive range of motion;Scar mobilization   PT Next Visit Plan ball progression/quadraped/planks   PT Home Exercise Plan stretching and core activation   Consulted and Agree with Plan of Care Patient        Problem List Patient Active Problem List   Diagnosis Date Noted  . Family history of breast cancer in mother 07/02/2015  . History of total vaginal hysterectomy 07/02/2015  . Lumbar stenosis 05/04/2015  . Spondylolisthesis of lumbar region 10/29/2014  . BP (high blood pressure) 03/13/2014  . HLD (hyperlipidemia) 03/13/2014  . Awareness of heartbeats 03/13/2014  . 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 Sarcoidosis ([place]) 03/13/2014  . Airway hyperreactivity 02/15/2013    Lavone Neri, SPT 07/13/2015, 5:04 PM  [place] [facility] [address] [place], [place], [address] Phone: [phone]   Fax:  [phone]  Name: Megan Daniels MRN: [id number] Date of Birth: 1951/06/02

## 2015-07-16 ENCOUNTER — Ambulatory Visit: Payer: Medicare Other | Admitting: Physical Therapy

## 2015-07-20 ENCOUNTER — Ambulatory Visit: Payer: Medicare Other | Admitting: Physical Therapy

## 2015-07-20 DIAGNOSIS — M545 Low back pain, unspecified: Secondary | ICD-10-CM

## 2015-07-20 DIAGNOSIS — M6281 Muscle weakness (generalized): Secondary | ICD-10-CM

## 2015-07-20 NOTE — Therapy (Signed)
[facility] [address] [place], [place], [address] Phone: [phone]   Fax:  [phone]  Physical Therapy Treatment  Patient Details  Name: Megan Daniels MRN: [id number] Date of Birth: November 09, 1950 No Data Recorded  Encounter Date: 07/20/2015      PT End of Session - 07/20/15 1717    Visit Number 9   Number of Visits 16   Date for PT Re-Evaluation 07/29/15   Authorization - Visit Number 9   Authorization - Number of Visits 16   PT Start Time 1614   PT Stop Time 1706   PT Time Calculation (min) 52 min   Activity Tolerance Patient tolerated treatment well;No increased pain   Behavior During Therapy [facility] for tasks assessed/performed      Past Medical History  Diagnosis Date  . Stress incontinence   . Palpitations     takes Metoprolol daily  . Hypertension     takes Losartan daily  . Sarcoidosis (HCC)   . Asthma     Symbicort as needed  . URI (upper respiratory infection)     treated self with Mucinex and no fever  . Seasonal allergies     takes Claritin daily  . Pneumonia     hx of-early 2000's  . History of bronchitis   . Weakness     numbness and tingling in both legs  . Foot drop     right  . Arthritis   . Joint pain   . Chronic back pain     spondylolisthesis/stenosis  . Vitamin D deficiency   . History of hiatal hernia   . GERD (gastroesophageal reflux disease)     takes Prevacid daily  . Diverticulosis   . History of colon polyps   . Urinary urgency   . Chronic UTI     was treated for 3 months with Macrobid and in Dec everything was fine  . Constipation   . Chalazion of left eye   . PONV (postoperative nausea and vomiting)     extreme nausea and vomiting -none last surgery 10/29/14  . Sleep apnea     cpap 10 yrs  . COPD (chronic obstructive pulmonary disease) (HCC)   . Shortness of breath dyspnea   . Elevated serum creatinine     with nsaids  . Cancer (HCC)     skin  . Acid reflux   .  Genital warts     h/o  . Abnormal Pap smear of cervix     pos hpv-     Past Surgical History  Procedure Laterality Date  . Tonsillectomy    . Abdominal hysterectomy    . Cervical fusion    . Nissan fundoplication    . Bunionectomy Bilateral   . Cataract surgery Bilateral   . Back surgery    . Carpal tunnel release Right     x 2  . Left middle finger surgery      pin- arthritic cyst  . Colonoscopy    . Esophagogastroduodenoscopy    . Lumbar laminectomy/decompression microdiscectomy Right 05/04/2015    Procedure: RIGHT LUMBAR TWO-THREE/THREE-FOUR LAMINECTOMY/DECOMPRESSION ;  Surgeon: Tressie Stalker, MD;  Location: [facility];  Service: Neurosurgery;  Laterality: Right;  Right L23 L34 laminectomy and foraminotomy  . Breast biopsy Right 1996    EXCISIONAL - NEG    There were no vitals filed for this visit.  Visit Diagnosis:  Midline low back pain without sciatica  Muscle weakness  Subjective Assessment - 07/20/15 1621    Subjective Pt. reports 3/10 low back (central) pain currently.  Pt. states she had a strenuous weekend cooking this weekend at church (on feet all day Saturday).  Pt. took Tylenol with benefit this morning due to soreness/ pain.   Pertinent History see past PT notes and MD surgical report    Limitations Standing;Walking;Lifting;Sitting   How long can you sit comfortably? 15-20 minutes   How long can you stand comfortably? 10-15 mins   How long can you walk comfortably? 10 mins   Diagnostic tests XRAYS   Patient Stated Goals Increase core strength/ pain-free work-related tasks/ being able to sit for an hour/ ability to pick up 30# granddaughter/return to walking    Currently in Pain? Yes   Pain Score 3    Pain Location Back   Pain Orientation Lower      OBJECTIVE:  There.ex.: Nustep L6 B UE/LE 10 min. (warm-up/no charge).  Walking in clinic with discussion of consistent step pattern/ heel strike and upright posture/ alternating arm swing.  Supine  TrA: SLR (10x), hip abd. With manual resistance (moderate), hip adduction with blue ball 10x 5-10 sec. Holds/ knee to chest with while ball 20x/ bridging 10x with ball/ dead bug 10x2 with cuing for breathing/ lumbar positioning.  Standing postural correction at mirror and corrects with verbal cues. Standing heel raises/ SLS (>20 sec.).  Manual tx.: supine LE/ hamstring stretches 5x.  Supine trunk rotn. With ball 5x each.  Supine R/L neural glides 3x each.  Pt response to tx for medical necessity: Pt benefits from TrA contraction/core stability to return to work with proper mechanics. No increase c/o pain reported with core ex. And remains hesitant with movement secondary to fear of back pain.          PT Long Term Goals - 07/02/15 2200    PT LONG TERM GOAL #1   Title Pt will decrease ODI score from 48% to 20% in order to improve functional mobility.   Baseline 48% on 9/28    Time 4   Period Weeks   Status On-going   PT LONG TERM GOAL #2   Title Pt will be able to ambulate on all terrians with no assistive device in order to safely return to work.    Baseline currently using a SPC for outdoor ambulation    Time 4   Period Weeks   Status Achieved   PT LONG TERM GOAL #3   Title Pt will be independent in home exercise program in order to tolerate sitting for 30 mins without increased complaints of pain.   Baseline currently only able to sit or drive for 55-97 mins.   Time 4   Period Weeks   Status Partially Met   PT LONG TERM GOAL #4   Title Pt will demonstrate proper lifting techniques with 30# consistently and safely in order to be able to pick up her granddaughter.    Baseline able to do 7# on 9/28   Time 4   Period Weeks   Status Not Met   PT LONG TERM GOAL #5   Title Pt will return to walking program in order to return to prior level of function.    Time 4   Period Weeks   Status On-going           Plan - 07/20/15 1719    Clinical Impression Statement Good  posture correction in standing with increase lumbar extension in pain tolerable  range.  Pt. remains fearful of certain movement patterns/ work-related tasks and PT educated pt. on importance of TrA muscle contraction t/o the day.  Pt. able to squat down to pick of bottle cap on ground with proper technique/ upright posture with no c/o pain.  Discuss pts. trip to [place] next visit and issue progressive HEP.     Pt will benefit from skilled therapeutic intervention in order to improve on the following deficits Decreased endurance;Improper body mechanics;Impaired flexibility;Decreased strength;Decreased mobility;Pain;Decreased range of motion   Rehab Potential Good   Clinical Impairments Affecting Rehab Potential previous back surgeries.    PT Frequency 2x / week   PT Duration 4 weeks   PT Treatment/Interventions ADLs/Self Care Home Management;Cryotherapy;Moist Heat;Balance training;Therapeutic exercise;Therapeutic activities;Functional mobility training;Stair training;Gait training;Neuromuscular re-education;Manual techniques;Passive range of motion;Scar mobilization   PT Next Visit Plan ball progression/quadraped/planks   PT Home Exercise Plan stretching and core activation   Consulted and Agree with Plan of Care Patient        Problem List Patient Active Problem List   Diagnosis Date Noted  . Family history of breast cancer in mother 07/02/2015  . History of total vaginal hysterectomy 07/02/2015  . Lumbar stenosis 05/04/2015  . Spondylolisthesis of lumbar region 10/29/2014  . BP (high blood pressure) 03/13/2014  . HLD (hyperlipidemia) 03/13/2014  . Awareness of heartbeats 03/13/2014  . 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 Sarcoidosis ([place]) 03/13/2014  . Airway hyperreactivity 02/15/2013   Pura Spice, PT, DPT # [id number]   07/20/2015, 5:22 PM  [facility] [address] [place], [place], [address] Phone: [phone]   Fax:   [phone]  Name: Megan Daniels MRN: [id number] Date of Birth: Oct 18, 1950

## 2015-07-27 ENCOUNTER — Encounter: Payer: Medicare Other | Admitting: Physical Therapy

## 2015-07-29 ENCOUNTER — Ambulatory Visit: Payer: Medicare Other | Admitting: Physical Therapy

## 2015-07-29 ENCOUNTER — Encounter: Payer: Self-pay | Admitting: Physical Therapy

## 2015-07-29 DIAGNOSIS — M6281 Muscle weakness (generalized): Secondary | ICD-10-CM

## 2015-07-29 DIAGNOSIS — M545 Low back pain, unspecified: Secondary | ICD-10-CM

## 2015-07-29 NOTE — Therapy (Signed)
[facility] [address] [place], [place], [address] Phone: [phone]   Fax:  [phone]  Physical Therapy Treatment  Patient Details  Name: Megan Daniels MRN: [id number] Date of Birth: Dec 15, 1950 No Data Recorded  Encounter Date: 07/29/2015      PT End of Session - 07/29/15 1743    Visit Number 10   Number of Visits 16   Date for PT Re-Evaluation 07/29/15   Authorization - Visit Number 10   Authorization - Number of Visits 16   PT Start Time 1616   PT Stop Time 1700   PT Time Calculation (min) 44 min   Activity Tolerance Patient tolerated treatment well;No increased pain   Behavior During Therapy [facility] for tasks assessed/performed      Past Medical History  Diagnosis Date  . Stress incontinence   . Palpitations     takes Metoprolol daily  . Hypertension     takes Losartan daily  . Sarcoidosis (HCC)   . Asthma     Symbicort as needed  . URI (upper respiratory infection)     treated self with Mucinex and no fever  . Seasonal allergies     takes Claritin daily  . Pneumonia     hx of-early 2000's  . History of bronchitis   . Weakness     numbness and tingling in both legs  . Foot drop     right  . Arthritis   . Joint pain   . Chronic back pain     spondylolisthesis/stenosis  . Vitamin D deficiency   . History of hiatal hernia   . GERD (gastroesophageal reflux disease)     takes Prevacid daily  . Diverticulosis   . History of colon polyps   . Urinary urgency   . Chronic UTI     was treated for 3 months with Macrobid and in Dec everything was fine  . Constipation   . Chalazion of left eye   . PONV (postoperative nausea and vomiting)     extreme nausea and vomiting -none last surgery 10/29/14  . Sleep apnea     cpap 10 yrs  . COPD (chronic obstructive pulmonary disease) (HCC)   . Shortness of breath dyspnea   . Elevated serum creatinine     with nsaids  . Cancer (HCC)     skin  . Acid reflux   .  Genital warts     h/o  . Abnormal Pap smear of cervix     pos hpv-     Past Surgical History  Procedure Laterality Date  . Tonsillectomy    . Abdominal hysterectomy    . Cervical fusion    . Nissan fundoplication    . Bunionectomy Bilateral   . Cataract surgery Bilateral   . Back surgery    . Carpal tunnel release Right     x 2  . Left middle finger surgery      pin- arthritic cyst  . Colonoscopy    . Esophagogastroduodenoscopy    . Lumbar laminectomy/decompression microdiscectomy Right 05/04/2015    Procedure: RIGHT LUMBAR TWO-THREE/THREE-FOUR LAMINECTOMY/DECOMPRESSION ;  Surgeon: Tressie Stalker, MD;  Location: [facility];  Service: Neurosurgery;  Laterality: Right;  Right L23 L34 laminectomy and foraminotomy  . Breast biopsy Right 1996    EXCISIONAL - NEG    There were no vitals filed for this visit.  Visit Diagnosis:  Midline low back pain without sciatica  Muscle weakness  Subjective Assessment - 07/29/15 1743    Subjective Pt reports soreness and decreased energy after working all day.    Pertinent History see past PT notes and MD surgical report    Limitations Standing;Walking;Lifting;Sitting   How long can you sit comfortably? 15-20 minutes   How long can you stand comfortably? 10-15 mins   How long can you walk comfortably? 10 mins   Diagnostic tests XRAYS   Patient Stated Goals Increase core strength/ pain-free work-related tasks/ being able to sit for an hour/ ability to pick up 30# granddaughter/return to walking    Currently in Pain? No/denies       OBJECTIVE: There ex: Warm up, NuStep L7 6 mins (no charge). Supine knees to chest on white therapy ball x 15 with proper TrA contraction. Supine ball bridging x 20 on white therapy ball with good stabilization noted. Supine bridging with straight leg raise/supine bridging with bicycle x 20 each. Pt educated on importance of staying active and engaging core at home instead of wearing her back brace.  Pt  response to tx for medical necessity: Pt benefited from TrA contraction and core stability in order to return to work without increased complaints of back pain. Pt is now independent with TrA contraction with all mobility and is discharged to independent progression of core stability.        PT Long Term Goals - 07/29/15 1746    PT LONG TERM GOAL #1   Title Pt will decrease ODI score from 48% to 20% in order to improve functional mobility.   Baseline 24% on 11/23   Time 4   Period Weeks   Status Not Met   PT LONG TERM GOAL #2   Title Pt will be able to ambulate on all terrians with no assistive device in order to safely return to work.    Baseline currently using a SPC for outdoor ambulation    Time 4   Period Weeks   Status Achieved   PT LONG TERM GOAL #3   Title Pt will be independent in home exercise program in order to tolerate sitting for 30 mins without increased complaints of pain.   Baseline currently only able to sit or drive for 47-82 mins.   Time 4   Period Weeks   Status Achieved   PT LONG TERM GOAL #4   Title Pt will demonstrate proper lifting techniques with 30# consistently and safely in order to be able to pick up her granddaughter.    Baseline able to do 7# on 9/28   Time 4   Period Weeks   Status Partially Met   PT LONG TERM GOAL #5   Title Pt will return to walking program in order to return to prior level of function.    Time 4   Period Weeks   Status Partially Met               Plan - 07/29/15 1744    Clinical Impression Statement Pt demonstrates proper core control with all exercises and reports a carryover into daily work tasks. Pt demonstrates good TrA contraction throughout all supine core progression. Pt educated on progression of TrA control and encouraged  to continue performing at home on independent basis. ODI: 24% self-perceived moderate disabilty. At this time, pt discharged from skilled PT to independent core stability program.   Pt  will benefit from skilled therapeutic intervention in order to improve on the following deficits Decreased endurance;Improper body mechanics;Impaired flexibility;Decreased strength;Decreased mobility;Pain;Decreased  range of motion   Rehab Potential Good   Clinical Impairments Affecting Rehab Potential previous back surgeries.    PT Frequency 2x / week   PT Duration 4 weeks   PT Treatment/Interventions ADLs/Self Care Home Management;Cryotherapy;Moist Heat;Balance training;Therapeutic exercise;Therapeutic activities;Functional mobility training;Stair training;Gait training;Neuromuscular re-education;Manual techniques;Passive range of motion;Scar mobilization   PT Home Exercise Plan TrA progression   Consulted and Agree with Plan of Care Patient        Problem List Patient Active Problem List   Diagnosis Date Noted  . Family history of breast cancer in mother 07/02/2015  . History of total vaginal hysterectomy 07/02/2015  . Lumbar stenosis 05/04/2015  . Spondylolisthesis of lumbar region 10/29/2014  . BP (high blood pressure) 03/13/2014  . HLD (hyperlipidemia) 03/13/2014  . Awareness of heartbeats 03/13/2014  . 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 Sarcoidosis ([place]) 03/13/2014  . Airway hyperreactivity 02/15/2013    Lavone Neri, SPT 07/29/2015, 5:47 PM  [facility] [address] [place], [place], [address] Phone: [phone]   Fax:  [phone]  Name: Megan Daniels MRN: [id number] Date of Birth: [birth year]

## 2015-08-27 ENCOUNTER — Encounter: Payer: Self-pay | Admitting: Internal Medicine

## 2016-06-22 ENCOUNTER — Other Ambulatory Visit: Payer: Self-pay | Admitting: Internal Medicine

## 2016-06-22 DIAGNOSIS — Z1231 Encounter for screening mammogram for malignant neoplasm of breast: Secondary | ICD-10-CM

## 2016-07-04 ENCOUNTER — Other Ambulatory Visit: Payer: Self-pay | Admitting: Internal Medicine

## 2016-07-04 DIAGNOSIS — M5030 Other cervical disc degeneration, unspecified cervical region: Secondary | ICD-10-CM

## 2016-07-20 ENCOUNTER — Ambulatory Visit
Admission: RE | Admit: 2016-07-20 | Discharge: 2016-07-20 | Disposition: A | Discharge status: Home / Self Care | Payer: Medicare Other | Source: Ambulatory Visit | Attending: Internal Medicine | Admitting: Internal Medicine

## 2016-07-20 DIAGNOSIS — M5030 Other cervical disc degeneration, unspecified cervical region: Secondary | ICD-10-CM | POA: Diagnosis present

## 2016-07-20 DIAGNOSIS — M4802 Spinal stenosis, cervical region: Secondary | ICD-10-CM | POA: Insufficient documentation

## 2016-07-26 ENCOUNTER — Ambulatory Visit
Admission: RE | Admit: 2016-07-26 | Discharge: 2016-07-26 | Disposition: A | Discharge status: Home / Self Care | Payer: Medicare Other | Source: Ambulatory Visit | Attending: Internal Medicine | Admitting: Internal Medicine

## 2016-07-26 DIAGNOSIS — Z1231 Encounter for screening mammogram for malignant neoplasm of breast: Secondary | ICD-10-CM | POA: Diagnosis not present

## 2016-08-31 ENCOUNTER — Other Ambulatory Visit: Payer: Self-pay | Admitting: Neurosurgery

## 2016-09-01 ENCOUNTER — Other Ambulatory Visit: Payer: Self-pay | Admitting: Nurse Practitioner

## 2016-09-01 DIAGNOSIS — K219 Gastro-esophageal reflux disease without esophagitis: Secondary | ICD-10-CM

## 2016-09-01 DIAGNOSIS — R1319 Other dysphagia: Secondary | ICD-10-CM

## 2016-09-01 DIAGNOSIS — Z86010 Personal history of colonic polyps: Secondary | ICD-10-CM | POA: Insufficient documentation

## 2016-09-09 ENCOUNTER — Ambulatory Visit
Admission: RE | Admit: 2016-09-09 | Discharge: 2016-09-09 | Disposition: A | Discharge status: Home / Self Care | Payer: Medicare Other | Source: Ambulatory Visit | Attending: Nurse Practitioner | Admitting: Nurse Practitioner

## 2016-09-09 DIAGNOSIS — R1319 Other dysphagia: Secondary | ICD-10-CM

## 2016-09-09 DIAGNOSIS — K228 Other specified diseases of esophagus: Secondary | ICD-10-CM | POA: Insufficient documentation

## 2016-09-09 DIAGNOSIS — K219 Gastro-esophageal reflux disease without esophagitis: Secondary | ICD-10-CM

## 2016-09-30 ENCOUNTER — Encounter (HOSPITAL_COMMUNITY)
Admission: RE | Admit: 2016-09-30 | Discharge: 2016-09-30 | Disposition: A | Discharge status: Home / Self Care | Payer: Medicare Other | Source: Ambulatory Visit | Attending: Neurosurgery | Admitting: Neurosurgery

## 2016-09-30 ENCOUNTER — Encounter (HOSPITAL_COMMUNITY): Payer: Self-pay

## 2016-09-30 DIAGNOSIS — I10 Essential (primary) hypertension: Secondary | ICD-10-CM | POA: Diagnosis not present

## 2016-09-30 DIAGNOSIS — Z01818 Encounter for other preprocedural examination: Secondary | ICD-10-CM | POA: Diagnosis present

## 2016-09-30 DIAGNOSIS — M47812 Spondylosis without myelopathy or radiculopathy, cervical region: Secondary | ICD-10-CM | POA: Insufficient documentation

## 2016-09-30 HISTORY — DX: Headache: R51

## 2016-09-30 HISTORY — DX: Headache, unspecified: R51.9

## 2016-09-30 LAB — TYPE AND SCREEN
ABO/RH(D): O POS
ANTIBODY SCREEN: NEGATIVE

## 2016-09-30 LAB — CBC
HEMATOCRIT: 39.5 % (ref 36.0–46.0)
HEMOGLOBIN: 13.1 g/dL (ref 12.0–15.0)
MCH: 30.1 pg (ref 26.0–34.0)
MCHC: 33.2 g/dL (ref 30.0–36.0)
MCV: 90.8 fL (ref 78.0–100.0)
Platelets: 169 10*3/uL (ref 150–400)
RBC: 4.35 MIL/uL (ref 3.87–5.11)
RDW: 13.5 % (ref 11.5–15.5)
WBC: 5.8 10*3/uL (ref 4.0–10.5)

## 2016-09-30 LAB — BASIC METABOLIC PANEL
ANION GAP: 8 (ref 5–15)
BUN: 15 mg/dL (ref 6–20)
CHLORIDE: 104 mmol/L (ref 101–111)
CO2: 27 mmol/L (ref 22–32)
Calcium: 9.3 mg/dL (ref 8.9–10.3)
Creatinine, Ser: 1.23 mg/dL — ABNORMAL HIGH (ref 0.44–1.00)
GFR calc non Af Amer: 45 mL/min — ABNORMAL LOW (ref >60)
GFR, EST AFRICAN AMERICAN: 52 mL/min — AB (ref >60)
Glucose, Bld: 115 mg/dL — ABNORMAL HIGH (ref 65–99)
Potassium: 3.6 mmol/L (ref 3.5–5.1)
Sodium: 139 mmol/L (ref 135–145)

## 2016-09-30 LAB — SURGICAL PCR SCREEN
MRSA, PCR: NEGATIVE
STAPHYLOCOCCUS AUREUS: NEGATIVE

## 2016-09-30 NOTE — Progress Notes (Signed)
REQUESTED SLEEP STUDY AND STRESS TEST IF THEY HAVE.  PATIENT NOT SURE HOW LONG AGO STRESS TEST WAS DONE OR WHERE.  [phone]    DR. Dugger

## 2016-09-30 NOTE — Pre-Procedure Instructions (Signed)
Megan Daniels  09/30/2016      [facility] [place] [address] Phone: [phone] Fax: [phone]  [facility] [place], [place] [address] Phone: [phone] Fax: [phone]    Your procedure is scheduled on  Thursday  10/06/16  Report to [place] at 530 A.M.  Call this number if you have problems the morning of surgery:  [phone]   Remember:  Do not eat food or drink liquids after midnight.  Take these medicines the morning of surgery with A SIP OF WATER   ALBUTEROL, LANSOPRAZOLE (PREVACID), METOPROLOL (TOPROL), TRAMADOL IF NEEDED     (STOP 7 DAYS PRIOR TO SURGERY- ASPIRIN, IBUPROFEN/ ADVIL/ MOTRIN, GOODY POWDERS, BC'S, MULTIVITAMIN, NAPROXEN/ ANAPROX, HERBAL MEDICINES)   Do not wear jewelry, make-up or nail polish.  Do not wear lotions, powders, or perfumes, or deoderant.  Do not shave 48 hours prior to surgery.  Men may shave face and neck.  Do not bring valuables to the hospital.  [facility] is not responsible for any belongings or valuables.  Contacts, dentures or bridgework may not be worn into surgery.  Leave your suitcase in the car.  After surgery it may be brought to your room.  For patients admitted to the hospital, discharge time will be determined by your treatment team.  Patients discharged the day of surgery will not be allowed to drive home.   Name and phone number of your driver:    Special instructions:  [place] - Preparing for Surgery  Before surgery, you can play an important role.  Because skin is not sterile, your skin needs to be as free of germs as possible.  You can reduce the number of germs on you skin by washing with CHG (chlorahexidine gluconate) soap before surgery.  CHG is an antiseptic cleaner which kills germs and bonds with the skin to continue killing germs even after  washing.  Please DO NOT use if you have an allergy to CHG or antibacterial soaps.  If your skin becomes reddened/irritated stop using the CHG and inform your nurse when you arrive at Short Stay.  Do not shave (including legs and underarms) for at least 48 hours prior to the first CHG shower.  You may shave your face.  Please follow these instructions carefully:   1.  Shower with CHG Soap the night before surgery and the                                morning of Surgery.  2.  If you choose to wash your hair, wash your hair first as usual with your       normal shampoo.  3.  After you shampoo, rinse your hair and body thoroughly to remove the                      Shampoo.  4.  Use CHG as you would any other liquid soap.  You can apply chg directly       to the skin and wash gently with scrungie or a clean washcloth.  5.  Apply the CHG Soap to your body ONLY FROM THE NECK DOWN.        Do not use on open wounds or open sores.  Avoid contact with  your eyes,       ears, mouth and genitals (private parts).  Wash genitals (private parts)       with your normal soap.  6.  Wash thoroughly, paying special attention to the area where your surgery        will be performed.  7.  Thoroughly rinse your body with warm water from the neck down.  8.  DO NOT shower/wash with your normal soap after using and rinsing off       the CHG Soap.  9.  Pat yourself dry with a clean towel.            10.  Wear clean pajamas.            11.  Place clean sheets on your bed the night of your first shower and do not        sleep with pets.  Day of Surgery  Do not apply any lotions/deoderants the morning of surgery.  Please wear clean clothes to the hospital/surgery center.    Please read over the following fact sheets that you were given. MRSA Information and Surgical Site Infection Prevention

## 2016-10-05 ENCOUNTER — Encounter (HOSPITAL_COMMUNITY): Payer: Self-pay | Admitting: Anesthesiology

## 2016-10-05 NOTE — Anesthesia Preprocedure Evaluation (Addendum)
Anesthesia Evaluation  Patient identified by MRN, date of birth, ID band Patient awake    Reviewed: Allergy & Precautions, NPO status , Patient's Chart, lab work & pertinent test results  History of Anesthesia Complications (+) PONV and history of anesthetic complications  Airway Mallampati: III  TM Distance: >3 FB Neck ROM: Limited    Dental no notable dental hx. (+) Teeth Intact   Pulmonary shortness of breath and with exertion, asthma , sleep apnea and Continuous Positive Airway Pressure Ventilation , pneumonia, resolved, COPD,  COPD inhaler,  Sarcoidosis   Pulmonary exam normal breath sounds clear to auscultation       Cardiovascular hypertension, Pt. on medications and Pt. on home beta blockers Normal cardiovascular exam Rhythm:Regular Rate:Normal     Neuro/Psych  Headaches, negative psych ROS   GI/Hepatic hiatal hernia, GERD  Medicated and Controlled,  Endo/Other  hyperlipidemia  Renal/GU Renal InsufficiencyRenal disease Bladder dysfunction  SUI    Musculoskeletal  (+) Arthritis , Osteoarthritis,  Cervical spondylosis with myelopathy and radiculopathy   Abdominal (+) + obese,   Peds  Hematology   Anesthesia Other Findings   Reproductive/Obstetrics                            Lab Results  Component Value Date   WBC 5.8 09/30/2016   HGB 13.1 09/30/2016   HCT 39.5 09/30/2016   MCV 90.8 09/30/2016   PLT 169 09/30/2016     Chemistry      Component Value Date/Time   NA 139 09/30/2016 1524   K 3.6 09/30/2016 1524   K 3.9 03/10/2014 0839   CL 104 09/30/2016 1524   CO2 27 09/30/2016 1524   BUN 15 09/30/2016 1524   CREATININE 1.23 (H) 09/30/2016 1524      Component Value Date/Time   CALCIUM 9.3 09/30/2016 1524     EKG: sinus bradycardia, otherwise normal EKG.  Anesthesia Physical Anesthesia Plan  ASA: III  Anesthesia Plan: General   Post-op Pain Management:     Induction:   Airway Management Planned: Oral ETT  Additional Equipment:   Intra-op Plan:   Post-operative Plan: Extubation in OR  Informed Consent: I have reviewed the patients History and Physical, chart, labs and discussed the procedure including the risks, benefits and alternatives for the proposed anesthesia with the patient or authorized representative who has indicated his/her understanding and acceptance.     Plan Discussed with: CRNA, Anesthesiologist and Surgeon  Anesthesia Plan Comments:        Anesthesia Quick Evaluation

## 2016-10-06 ENCOUNTER — Ambulatory Visit (HOSPITAL_COMMUNITY): Payer: Medicare Other | Admitting: Certified Registered Nurse Anesthetist

## 2016-10-06 ENCOUNTER — Ambulatory Visit (HOSPITAL_COMMUNITY)
Admission: RE | Admit: 2016-10-06 | Discharge: 2016-10-07 | Disposition: A | Discharge status: Home / Self Care | Payer: Medicare Other | Source: Ambulatory Visit | Attending: Neurosurgery | Admitting: Neurosurgery

## 2016-10-06 ENCOUNTER — Encounter (HOSPITAL_COMMUNITY)
Admission: RE | Disposition: A | Discharge status: Home / Self Care | Payer: Self-pay | Source: Ambulatory Visit | Attending: Neurosurgery

## 2016-10-06 ENCOUNTER — Encounter (HOSPITAL_COMMUNITY): Payer: Self-pay | Admitting: Surgery

## 2016-10-06 ENCOUNTER — Ambulatory Visit (HOSPITAL_COMMUNITY): Payer: Medicare Other

## 2016-10-06 DIAGNOSIS — Z86010 Personal history of colonic polyps: Secondary | ICD-10-CM | POA: Insufficient documentation

## 2016-10-06 DIAGNOSIS — R002 Palpitations: Secondary | ICD-10-CM | POA: Insufficient documentation

## 2016-10-06 DIAGNOSIS — Z85828 Personal history of other malignant neoplasm of skin: Secondary | ICD-10-CM | POA: Diagnosis not present

## 2016-10-06 DIAGNOSIS — Z888 Allergy status to other drugs, medicaments and biological substances status: Secondary | ICD-10-CM | POA: Diagnosis not present

## 2016-10-06 DIAGNOSIS — Z419 Encounter for procedure for purposes other than remedying health state, unspecified: Secondary | ICD-10-CM

## 2016-10-06 DIAGNOSIS — M4722 Other spondylosis with radiculopathy, cervical region: Secondary | ICD-10-CM | POA: Diagnosis not present

## 2016-10-06 DIAGNOSIS — G8929 Other chronic pain: Secondary | ICD-10-CM | POA: Diagnosis not present

## 2016-10-06 DIAGNOSIS — G4730 Sleep apnea, unspecified: Secondary | ICD-10-CM | POA: Diagnosis not present

## 2016-10-06 DIAGNOSIS — Z885 Allergy status to narcotic agent status: Secondary | ICD-10-CM | POA: Diagnosis not present

## 2016-10-06 DIAGNOSIS — J449 Chronic obstructive pulmonary disease, unspecified: Secondary | ICD-10-CM | POA: Diagnosis not present

## 2016-10-06 DIAGNOSIS — N393 Stress incontinence (female) (male): Secondary | ICD-10-CM | POA: Insufficient documentation

## 2016-10-06 DIAGNOSIS — E785 Hyperlipidemia, unspecified: Secondary | ICD-10-CM | POA: Diagnosis not present

## 2016-10-06 DIAGNOSIS — M50121 Cervical disc disorder at C4-C5 level with radiculopathy: Secondary | ICD-10-CM | POA: Insufficient documentation

## 2016-10-06 DIAGNOSIS — Z8719 Personal history of other diseases of the digestive system: Secondary | ICD-10-CM | POA: Insufficient documentation

## 2016-10-06 DIAGNOSIS — M4802 Spinal stenosis, cervical region: Secondary | ICD-10-CM | POA: Diagnosis not present

## 2016-10-06 DIAGNOSIS — K449 Diaphragmatic hernia without obstruction or gangrene: Secondary | ICD-10-CM | POA: Diagnosis not present

## 2016-10-06 DIAGNOSIS — D869 Sarcoidosis, unspecified: Secondary | ICD-10-CM | POA: Insufficient documentation

## 2016-10-06 DIAGNOSIS — I10 Essential (primary) hypertension: Secondary | ICD-10-CM | POA: Insufficient documentation

## 2016-10-06 DIAGNOSIS — E669 Obesity, unspecified: Secondary | ICD-10-CM | POA: Insufficient documentation

## 2016-10-06 DIAGNOSIS — Z881 Allergy status to other antibiotic agents status: Secondary | ICD-10-CM | POA: Insufficient documentation

## 2016-10-06 DIAGNOSIS — K219 Gastro-esophageal reflux disease without esophagitis: Secondary | ICD-10-CM | POA: Insufficient documentation

## 2016-10-06 DIAGNOSIS — Z6830 Body mass index (BMI) 30.0-30.9, adult: Secondary | ICD-10-CM | POA: Diagnosis not present

## 2016-10-06 DIAGNOSIS — Z7982 Long term (current) use of aspirin: Secondary | ICD-10-CM | POA: Insufficient documentation

## 2016-10-06 DIAGNOSIS — E559 Vitamin D deficiency, unspecified: Secondary | ICD-10-CM | POA: Insufficient documentation

## 2016-10-06 DIAGNOSIS — M1990 Unspecified osteoarthritis, unspecified site: Secondary | ICD-10-CM | POA: Insufficient documentation

## 2016-10-06 HISTORY — PX: ANTERIOR CERVICAL DECOMP/DISCECTOMY FUSION: SHX1161

## 2016-10-06 SURGERY — ANTERIOR CERVICAL DECOMPRESSION/DISCECTOMY FUSION 2 LEVELS
Anesthesia: General | Site: Spine Cervical

## 2016-10-06 MED ORDER — SUGAMMADEX SODIUM 200 MG/2ML IV SOLN
INTRAVENOUS | Status: DC | PRN
  Administered 2016-10-06: 157.8 mg via INTRAVENOUS

## 2016-10-06 MED ORDER — METOCLOPRAMIDE HCL 5 MG/ML IJ SOLN: 10.0000 mg | Freq: Once | INTRAMUSCULAR | Status: DC | PRN

## 2016-10-06 MED ORDER — BUPIVACAINE-EPINEPHRINE (PF) 0.5% -1:200000 IJ SOLN
INTRAMUSCULAR | Status: AC
  Filled 2016-10-06: qty 30

## 2016-10-06 MED ORDER — HEMOSTATIC AGENTS (NO CHARGE) OPTIME
TOPICAL | Status: DC | PRN
  Administered 2016-10-06: 1 via TOPICAL

## 2016-10-06 MED ORDER — 0.9 % SODIUM CHLORIDE (POUR BTL) OPTIME
TOPICAL | Status: DC | PRN
  Administered 2016-10-06: 1000 mL

## 2016-10-06 MED ORDER — CHLORHEXIDINE GLUCONATE CLOTH 2 % EX PADS: 6.0000 | MEDICATED_PAD | Freq: Once | CUTANEOUS | Status: DC

## 2016-10-06 MED ORDER — LACTATED RINGERS IV SOLN
INTRAVENOUS | Status: DC | PRN
  Administered 2016-10-06 (×2): via INTRAVENOUS

## 2016-10-06 MED ORDER — MIDAZOLAM HCL 2 MG/2ML IJ SOLN
INTRAMUSCULAR | Status: AC
  Filled 2016-10-06: qty 2

## 2016-10-06 MED ORDER — ZOLPIDEM TARTRATE 5 MG PO TABS: 5.0000 mg | ORAL_TABLET | Freq: Every evening | ORAL | Status: DC | PRN

## 2016-10-06 MED ORDER — THROMBIN 5000 UNITS EX SOLR
CUTANEOUS | Status: AC
  Filled 2016-10-06: qty 10000

## 2016-10-06 MED ORDER — TRAMADOL HCL 50 MG PO TABS: 25.0000 mg | ORAL_TABLET | Freq: Every day | ORAL | Status: DC | PRN

## 2016-10-06 MED ORDER — CEFAZOLIN SODIUM-DEXTROSE 2-4 GM/100ML-% IV SOLN
2.0000 g | Freq: Three times a day (TID) | INTRAVENOUS | Status: AC
  Administered 2016-10-06 (×2): 2 g via INTRAVENOUS
  Filled 2016-10-06 (×2): qty 100

## 2016-10-06 MED ORDER — FENTANYL CITRATE (PF) 100 MCG/2ML IJ SOLN
INTRAMUSCULAR | Status: AC
  Filled 2016-10-06: qty 2

## 2016-10-06 MED ORDER — MAGNESIUM OXIDE 400 (241.3 MG) MG PO TABS
200.0000 mg | ORAL_TABLET | Freq: Every day | ORAL | Status: DC
  Filled 2016-10-06 (×2): qty 0.5

## 2016-10-06 MED ORDER — CALCIUM CARBONATE-VITAMIN D 600-400 MG-UNIT PO TABS: ORAL_TABLET | Freq: Every day | ORAL | Status: DC

## 2016-10-06 MED ORDER — BACITRACIN ZINC 500 UNIT/GM EX OINT
TOPICAL_OINTMENT | CUTANEOUS | Status: AC
  Filled 2016-10-06: qty 28.35

## 2016-10-06 MED ORDER — DEXAMETHASONE SODIUM PHOSPHATE 4 MG/ML IJ SOLN
4.0000 mg | Freq: Four times a day (QID) | INTRAMUSCULAR | Status: AC
  Administered 2016-10-06 (×2): 4 mg via INTRAVENOUS
  Filled 2016-10-06 (×2): qty 1

## 2016-10-06 MED ORDER — ALUM & MAG HYDROXIDE-SIMETH 200-200-20 MG/5ML PO SUSP: 30.0000 mL | Freq: Four times a day (QID) | ORAL | Status: DC | PRN

## 2016-10-06 MED ORDER — MENTHOL 3 MG MT LOZG: 1.0000 | LOZENGE | OROMUCOSAL | Status: DC | PRN

## 2016-10-06 MED ORDER — THROMBIN 5000 UNITS EX SOLR
CUTANEOUS | Status: AC
  Filled 2016-10-06: qty 5000

## 2016-10-06 MED ORDER — DEXTROSE 5 % IV SOLN
INTRAVENOUS | Status: DC | PRN
  Administered 2016-10-06: 25 ug/min via INTRAVENOUS

## 2016-10-06 MED ORDER — DEXAMETHASONE SODIUM PHOSPHATE 10 MG/ML IJ SOLN
INTRAMUSCULAR | Status: DC | PRN
  Administered 2016-10-06: 10 mg via INTRAVENOUS

## 2016-10-06 MED ORDER — BACITRACIN ZINC 500 UNIT/GM EX OINT
TOPICAL_OINTMENT | CUTANEOUS | Status: DC | PRN
  Administered 2016-10-06: 1 via TOPICAL

## 2016-10-06 MED ORDER — SODIUM CHLORIDE 0.9 % IR SOLN
Status: DC | PRN
  Administered 2016-10-06: 500 mL

## 2016-10-06 MED ORDER — BISACODYL 10 MG RE SUPP: 10.0000 mg | Freq: Every day | RECTAL | Status: DC | PRN

## 2016-10-06 MED ORDER — HYDROMORPHONE HCL 2 MG PO TABS
4.0000 mg | ORAL_TABLET | ORAL | Status: DC | PRN
  Administered 2016-10-06 – 2016-10-07 (×5): 4 mg via ORAL
  Filled 2016-10-06 (×5): qty 2

## 2016-10-06 MED ORDER — ACETAMINOPHEN 325 MG PO TABS: 650.0000 mg | ORAL_TABLET | ORAL | Status: DC | PRN

## 2016-10-06 MED ORDER — HYDROMORPHONE HCL 1 MG/ML IJ SOLN
INTRAMUSCULAR | Status: AC
  Filled 2016-10-06: qty 0.5

## 2016-10-06 MED ORDER — PHENOL 1.4 % MT LIQD: 1.0000 | OROMUCOSAL | Status: DC | PRN

## 2016-10-06 MED ORDER — FENTANYL CITRATE (PF) 100 MCG/2ML IJ SOLN
INTRAMUSCULAR | Status: DC | PRN
  Administered 2016-10-06: 50 ug via INTRAVENOUS
  Administered 2016-10-06: 100 ug via INTRAVENOUS
  Administered 2016-10-06: 50 ug via INTRAVENOUS

## 2016-10-06 MED ORDER — ACETAMINOPHEN 650 MG RE SUPP: 650.0000 mg | RECTAL | Status: DC | PRN

## 2016-10-06 MED ORDER — THROMBIN 5000 UNITS EX SOLR
CUTANEOUS | Status: DC | PRN
  Administered 2016-10-06 (×2): 5000 [IU] via TOPICAL

## 2016-10-06 MED ORDER — MAGNESIUM 200 MG PO TABS
250.0000 mg | ORAL_TABLET | Freq: Every day | ORAL | Status: DC
  Filled 2016-10-06: qty 2

## 2016-10-06 MED ORDER — DOCUSATE SODIUM 100 MG PO CAPS
100.0000 mg | ORAL_CAPSULE | Freq: Two times a day (BID) | ORAL | Status: DC
  Administered 2016-10-06 – 2016-10-07 (×3): 100 mg via ORAL
  Filled 2016-10-06 (×3): qty 1

## 2016-10-06 MED ORDER — MORPHINE SULFATE (PF) 4 MG/ML IV SOLN
1.0000 mg | INTRAVENOUS | Status: DC | PRN
Start: 2016-10-06 — End: 2016-10-07

## 2016-10-06 MED ORDER — CALCIUM CARBONATE-VITAMIN D 500-200 MG-UNIT PO TABS
1.0000 | ORAL_TABLET | Freq: Every day | ORAL | Status: DC
  Administered 2016-10-07: 1 via ORAL
  Filled 2016-10-06: qty 1

## 2016-10-06 MED ORDER — ONDANSETRON HCL 4 MG/2ML IJ SOLN
INTRAMUSCULAR | Status: DC | PRN
  Administered 2016-10-06: 4 mg via INTRAVENOUS

## 2016-10-06 MED ORDER — PANTOPRAZOLE SODIUM 40 MG PO TBEC
40.0000 mg | DELAYED_RELEASE_TABLET | Freq: Every day | ORAL | Status: DC
  Administered 2016-10-07: 40 mg via ORAL
  Filled 2016-10-06: qty 1

## 2016-10-06 MED ORDER — VITAMIN D 1000 UNITS PO TABS
1000.0000 [IU] | ORAL_TABLET | Freq: Every day | ORAL | Status: DC
  Administered 2016-10-06 – 2016-10-07 (×2): 1000 [IU] via ORAL
  Filled 2016-10-06 (×2): qty 1

## 2016-10-06 MED ORDER — PROPOFOL 10 MG/ML IV BOLUS
INTRAVENOUS | Status: DC | PRN
  Administered 2016-10-06: 140 mg via INTRAVENOUS

## 2016-10-06 MED ORDER — THROMBIN 5000 UNITS EX SOLR
OROMUCOSAL | Status: DC | PRN
  Administered 2016-10-06: 5 mL via TOPICAL

## 2016-10-06 MED ORDER — SCOPOLAMINE 1 MG/3DAYS TD PT72
MEDICATED_PATCH | TRANSDERMAL | Status: DC | PRN
  Administered 2016-10-06: 1 via TRANSDERMAL

## 2016-10-06 MED ORDER — LACTATED RINGERS IV SOLN: INTRAVENOUS | Status: DC

## 2016-10-06 MED ORDER — MIDAZOLAM HCL 5 MG/5ML IJ SOLN
INTRAMUSCULAR | Status: DC | PRN
Start: 2016-10-06 — End: 2016-10-06
  Administered 2016-10-06: 1 mg via INTRAVENOUS

## 2016-10-06 MED ORDER — SCOPOLAMINE 1 MG/3DAYS TD PT72
MEDICATED_PATCH | TRANSDERMAL | Status: AC
  Filled 2016-10-06: qty 1

## 2016-10-06 MED ORDER — ADULT MULTIVITAMIN W/MINERALS CH: 1.0000 | ORAL_TABLET | Freq: Every day | ORAL | Status: DC

## 2016-10-06 MED ORDER — CEFAZOLIN SODIUM-DEXTROSE 2-4 GM/100ML-% IV SOLN
2.0000 g | INTRAVENOUS | Status: AC
  Administered 2016-10-06: 2 g via INTRAVENOUS
  Filled 2016-10-06: qty 100

## 2016-10-06 MED ORDER — PROPOFOL 10 MG/ML IV BOLUS
INTRAVENOUS | Status: AC
  Filled 2016-10-06: qty 40

## 2016-10-06 MED ORDER — ALBUTEROL SULFATE (2.5 MG/3ML) 0.083% IN NEBU: 3.0000 mL | INHALATION_SOLUTION | RESPIRATORY_TRACT | Status: DC | PRN

## 2016-10-06 MED ORDER — ALBUTEROL SULFATE HFA 108 (90 BASE) MCG/ACT IN AERS
INHALATION_SPRAY | RESPIRATORY_TRACT | Status: DC | PRN
  Administered 2016-10-06: 6 via RESPIRATORY_TRACT

## 2016-10-06 MED ORDER — BUPIVACAINE-EPINEPHRINE (PF) 0.5% -1:200000 IJ SOLN
INTRAMUSCULAR | Status: DC | PRN
  Administered 2016-10-06: 10 mL

## 2016-10-06 MED ORDER — DIAZEPAM 5 MG PO TABS
5.0000 mg | ORAL_TABLET | Freq: Four times a day (QID) | ORAL | Status: DC | PRN
  Administered 2016-10-06 (×2): 5 mg via ORAL
  Filled 2016-10-06 (×2): qty 1

## 2016-10-06 MED ORDER — ROCURONIUM BROMIDE 100 MG/10ML IV SOLN
INTRAVENOUS | Status: DC | PRN
  Administered 2016-10-06: 40 mg via INTRAVENOUS
  Administered 2016-10-06 (×2): 10 mg via INTRAVENOUS

## 2016-10-06 MED ORDER — DEXAMETHASONE 4 MG PO TABS
4.0000 mg | ORAL_TABLET | Freq: Four times a day (QID) | ORAL | Status: AC
  Administered 2016-10-06 – 2016-10-07 (×2): 4 mg via ORAL
  Filled 2016-10-06 (×2): qty 1

## 2016-10-06 MED ORDER — MEPERIDINE HCL 25 MG/ML IJ SOLN: 6.2500 mg | INTRAMUSCULAR | Status: DC | PRN

## 2016-10-06 MED ORDER — ONDANSETRON HCL 4 MG/2ML IJ SOLN: 4.0000 mg | INTRAMUSCULAR | Status: DC | PRN

## 2016-10-06 MED ORDER — LOSARTAN POTASSIUM 50 MG PO TABS
100.0000 mg | ORAL_TABLET | Freq: Every evening | ORAL | Status: DC
  Administered 2016-10-06: 100 mg via ORAL
  Filled 2016-10-06: qty 2

## 2016-10-06 MED ORDER — GABAPENTIN 300 MG PO CAPS
300.0000 mg | ORAL_CAPSULE | Freq: Every day | ORAL | Status: DC
  Administered 2016-10-06: 300 mg via ORAL
  Filled 2016-10-06: qty 1

## 2016-10-06 MED ORDER — METOPROLOL SUCCINATE ER 25 MG PO TB24
25.0000 mg | ORAL_TABLET | Freq: Two times a day (BID) | ORAL | Status: DC
  Administered 2016-10-06 – 2016-10-07 (×2): 25 mg via ORAL
  Filled 2016-10-06 (×2): qty 1

## 2016-10-06 MED ORDER — HYDROMORPHONE HCL 1 MG/ML IJ SOLN
0.2500 mg | INTRAMUSCULAR | Status: DC | PRN
  Administered 2016-10-06 (×2): 0.5 mg via INTRAVENOUS

## 2016-10-06 MED ORDER — LIDOCAINE HCL (CARDIAC) 20 MG/ML IV SOLN
INTRAVENOUS | Status: DC | PRN
  Administered 2016-10-06: 60 mg via INTRAVENOUS

## 2016-10-06 MED ORDER — DIPHENHYDRAMINE HCL 25 MG PO CAPS: 25.0000 mg | ORAL_CAPSULE | Freq: Four times a day (QID) | ORAL | Status: DC | PRN

## 2016-10-06 SURGICAL SUPPLY — 64 items
BAG DECANTER FOR FLEXI CONT (MISCELLANEOUS) ×3 IMPLANT
BENZOIN TINCTURE PRP APPL 2/3 (GAUZE/BANDAGES/DRESSINGS) ×3 IMPLANT
BIT DRILL NEURO 2X3.1 SFT TUCH (MISCELLANEOUS) ×1 IMPLANT
BLADE SURG 15 STRL LF DISP TIS (BLADE) ×1 IMPLANT
BLADE SURG 15 STRL SS (BLADE) ×2
BLADE ULTRA TIP 2M (BLADE) ×3 IMPLANT
BUR BARREL STRAIGHT FLUTE 4.0 (BURR) ×3 IMPLANT
BUR MATCHSTICK NEURO 3.0 LAGG (BURR) ×3 IMPLANT
CAGE PEEK VISTAS 11X14X6 (Cage) ×6 IMPLANT
CANISTER SUCT 3000ML PPV (MISCELLANEOUS) ×3 IMPLANT
CARTRIDGE OIL MAESTRO DRILL (MISCELLANEOUS) ×1 IMPLANT
CLOSURE WOUND 1/2 X4 (GAUZE/BANDAGES/DRESSINGS) ×1
COVER MAYO STAND STRL (DRAPES) ×3 IMPLANT
DIFFUSER DRILL AIR PNEUMATIC (MISCELLANEOUS) ×3 IMPLANT
DRAPE LAPAROTOMY 100X72 PEDS (DRAPES) ×3 IMPLANT
DRAPE MICROSCOPE LEICA (MISCELLANEOUS) IMPLANT
DRAPE POUCH INSTRU U-SHP 10X18 (DRAPES) ×3 IMPLANT
DRAPE SURG 17X23 STRL (DRAPES) ×6 IMPLANT
DRILL NEURO 2X3.1 SOFT TOUCH (MISCELLANEOUS) ×3
ELECT REM PT RETURN 9FT ADLT (ELECTROSURGICAL) ×3
ELECTRODE REM PT RTRN 9FT ADLT (ELECTROSURGICAL) ×1 IMPLANT
GAUZE SPONGE 4X4 12PLY STRL (GAUZE/BANDAGES/DRESSINGS) ×3 IMPLANT
GAUZE SPONGE 4X4 16PLY XRAY LF (GAUZE/BANDAGES/DRESSINGS) IMPLANT
GLOVE BIO SURGEON STRL SZ8 (GLOVE) ×3 IMPLANT
GLOVE BIO SURGEON STRL SZ8.5 (GLOVE) ×3 IMPLANT
GLOVE BIOGEL PI IND STRL 8 (GLOVE) ×2 IMPLANT
GLOVE BIOGEL PI IND STRL 8.5 (GLOVE) ×1 IMPLANT
GLOVE BIOGEL PI INDICATOR 8 (GLOVE) ×4
GLOVE BIOGEL PI INDICATOR 8.5 (GLOVE) ×2
GLOVE ECLIPSE 6.5 STRL STRAW (GLOVE) ×3 IMPLANT
GLOVE ECLIPSE 7.5 STRL STRAW (GLOVE) ×9 IMPLANT
GLOVE ECLIPSE 8.5 STRL (GLOVE) ×3 IMPLANT
GLOVE EXAM NITRILE LRG STRL (GLOVE) IMPLANT
GLOVE EXAM NITRILE XL STR (GLOVE) IMPLANT
GLOVE EXAM NITRILE XS STR PU (GLOVE) IMPLANT
GOWN STRL REUS W/ TWL LRG LVL3 (GOWN DISPOSABLE) IMPLANT
GOWN STRL REUS W/ TWL XL LVL3 (GOWN DISPOSABLE) ×1 IMPLANT
GOWN STRL REUS W/TWL 2XL LVL3 (GOWN DISPOSABLE) ×6 IMPLANT
GOWN STRL REUS W/TWL LRG LVL3 (GOWN DISPOSABLE)
GOWN STRL REUS W/TWL XL LVL3 (GOWN DISPOSABLE) ×2
HEMOSTAT POWDER KIT SURGIFOAM (HEMOSTASIS) ×3 IMPLANT
KIT BASIN OR (CUSTOM PROCEDURE TRAY) ×3 IMPLANT
KIT ROOM TURNOVER OR (KITS) ×3 IMPLANT
MARKER SKIN DUAL TIP RULER LAB (MISCELLANEOUS) ×3 IMPLANT
NEEDLE HYPO 22GX1.5 SAFETY (NEEDLE) ×3 IMPLANT
NEEDLE SPNL 18GX3.5 QUINCKE PK (NEEDLE) ×3 IMPLANT
NS IRRIG 1000ML POUR BTL (IV SOLUTION) ×3 IMPLANT
OIL CARTRIDGE MAESTRO DRILL (MISCELLANEOUS) ×3
PACK LAMINECTOMY NEURO (CUSTOM PROCEDURE TRAY) ×3 IMPLANT
PIN DISTRACTION 14MM (PIN) ×6 IMPLANT
PLATE ANT CERV XTEND ELD 1 L12 (Plate) ×6 IMPLANT
PUTTY KINEX BIOACTIVE 5CC (Bone Implant) ×3 IMPLANT
RUBBERBAND STERILE (MISCELLANEOUS) IMPLANT
SCREW XTD VAR 4.2 SELF TAP 12 (Screw) ×24 IMPLANT
SPONGE INTESTINAL PEANUT (DISPOSABLE) ×6 IMPLANT
SPONGE SURGIFOAM ABS GEL SZ50 (HEMOSTASIS) ×3 IMPLANT
STRIP CLOSURE SKIN 1/2X4 (GAUZE/BANDAGES/DRESSINGS) ×2 IMPLANT
SUT VIC AB 0 CT1 27 (SUTURE) ×2
SUT VIC AB 0 CT1 27XBRD ANTBC (SUTURE) ×1 IMPLANT
SUT VIC AB 3-0 SH 8-18 (SUTURE) ×3 IMPLANT
TAPE CLOTH SURG 4X10 WHT LF (GAUZE/BANDAGES/DRESSINGS) ×3 IMPLANT
TOWEL OR 17X24 6PK STRL BLUE (TOWEL DISPOSABLE) ×3 IMPLANT
TOWEL OR 17X26 10 PK STRL BLUE (TOWEL DISPOSABLE) ×3 IMPLANT
WATER STERILE IRR 1000ML POUR (IV SOLUTION) ×3 IMPLANT

## 2016-10-06 NOTE — Progress Notes (Signed)
Patient ID: Megan Daniels, female   DOB: November 28, 1950, 66 y.o.   MRN: [id number] Subjective:  The patient is alert and pleasant. She is in no apparent distress.  Objective: Vital signs in last 24 hours: Temp:  [97.2 F (36.2 C)-98.7 F (37.1 C)] 97.9 F (36.6 C) (02/01 1217) Pulse Rate:  [58-66] 66 (02/01 1217) Resp:  [14-21] 16 (02/01 1217) BP: (110-168)/(63-86) 133/81 (02/01 1217) SpO2:  [95 %-100 %] 96 % (02/01 1217)  Intake/Output from previous day: No intake/output data recorded. Intake/Output this shift: Total I/O In: 1440 [P.O.:240; I.V.:1200] Out: 100 [Blood:100]  Physical exam the patient is alert and pleasant. Her strength is normal and about deltoids biceps, triceps and lower extremities.  Her dressing is clean and dry. There is no evidence of hematoma or shift.  Lab Results: No results for input(s): WBC, HGB, HCT, PLT in the last 72 hours. BMET No results for input(s): NA, K, CL, CO2, GLUCOSE, BUN, CREATININE, CALCIUM in the last 72 hours.  Studies/Results: Dg Cervical Spine 2-3 Views  Result Date: 10/06/2016 CLINICAL DATA:  Intraoperative localization for spine surgery. EXAM: CERVICAL SPINE - 2-3 VIEW COMPARISON:  Cervical spine MRI 07/20/2016 FINDINGS: The spinal needle it is marking C4-5 disc space on the first film. Subsequent film demonstrates anterior and interbody fusion hardware at C4-5. IMPRESSION: C4-5 fusion.  No complicating features. Electronically Signed   By: Marijo Sanes M.D.   On: 10/06/2016 11:33    Assessment/Plan: The patient is doing well. I spoke with the family.  LOS: 0 days     Luccas Towell D 10/06/2016, 1:24 PM

## 2016-10-06 NOTE — Anesthesia Procedure Notes (Signed)
Procedure Name: Intubation Date/Time: 10/06/2016 7:46 AM Performed by: Tressia Miners LEFFEW Pre-anesthesia Checklist: Patient identified, Patient being monitored, Timeout performed, Emergency Drugs available and Suction available Patient Re-evaluated:Patient Re-evaluated prior to inductionOxygen Delivery Method: Circle System Utilized Preoxygenation: Pre-oxygenation with 100% oxygen Intubation Type: IV induction Ventilation: Mask ventilation without difficulty Laryngoscope Size: 3 and Glidescope Grade View: Grade II Tube type: Oral Tube size: 7.0 mm Number of attempts: 1 Airway Equipment and Method: Video-laryngoscopy and Rigid stylet Placement Confirmation: ETT inserted through vocal cords under direct vision,  positive ETCO2 and breath sounds checked- equal and bilateral Secured at: 22 cm Tube secured with: Tape Dental Injury: Teeth and Oropharynx as per pre-operative assessment  Difficulty Due To: Difficulty was anticipated and Difficult Airway- due to anterior larynx

## 2016-10-06 NOTE — Transfer of Care (Signed)
Immediate Anesthesia Transfer of Care Note  Patient: Deretha Emory  Procedure(s) Performed: Procedure(s) with comments: ANTERIOR CERVICAL DECOMPRESSION/DISCECTOMY FUSION , INTERBODY PROSTHESIS, PLATE CERVICAL FOUR CERVICAL FIVE, CERVICAL SIX- CERVICAL SEVEN (N/A) - anterior  Patient Location: PACU  Anesthesia Type:General  Level of Consciousness: awake, alert , oriented, patient cooperative and responds to stimulation  Airway & Oxygen Therapy: Patient Spontanous Breathing and Patient connected to nasal cannula oxygen  Post-op Assessment: Report given to RN, Post -op Vital signs reviewed and stable and Patient moving all extremities X 4  Post vital signs: Reviewed and stable  Last Vitals:  Vitals:   10/06/16 0625  BP: (!) 168/86  Pulse: (!) 58  Resp: 20  Temp: 37.1 C    Last Pain:  Vitals:   10/06/16 0625  TempSrc: Oral      Patients Stated Pain Goal: 3 ([id number])  Complications: No apparent anesthesia complications

## 2016-10-06 NOTE — H&P (Signed)
Subjective: The patient is a 66 year old white female has complained of neck and left greater than right arm pain consistent with a cervical radiculopathy. She has failed medical management and was worked up with a cervical MRI. This demonstrates spondylosis and stenosis most prominent at C4-5 and C6-7. I discussed the various treatment options with the patient. She has decided to proceed with surgery.   Past Medical History:  Diagnosis Date  . Abnormal Pap smear of cervix    pos hpv-   . Acid reflux   . Arthritis   . Asthma    Symbicort as needed  . Cancer ([place])    skin  . Chalazion of left eye   . Chronic back pain    spondylolisthesis/stenosis  . Chronic UTI    was treated for 3 months with Macrobid and in Dec everything was fine  . Constipation   . COPD (chronic obstructive pulmonary disease) ([place])   . Diverticulosis   . Elevated serum creatinine    with nsaids  . Foot drop    right  . Genital warts    h/o  . GERD (gastroesophageal reflux disease)    takes Prevacid daily  . Headache   . History of bronchitis   . History of colon polyps   . History of hiatal hernia   . Hypertension    takes Losartan daily  . Joint pain   . Palpitations    takes Metoprolol daily  . Pneumonia    hx of-early 2000's  . PONV (postoperative nausea and vomiting)    extreme nausea and vomiting -none last surgery 10/29/14  . Sarcoidosis ([place])   . Seasonal allergies    takes Claritin daily  . Shortness of breath dyspnea   . Sleep apnea    cpap 10 yrs  . Stress incontinence   . URI (upper respiratory infection)    treated self with Mucinex and no fever  . Urinary urgency   . Vitamin D deficiency   . Weakness    numbness and tingling in both legs    Past Surgical History:  Procedure Laterality Date  . ABDOMINAL HYSTERECTOMY    . BACK SURGERY    . BREAST BIOPSY Right 1996   EXCISIONAL - NEG  . BUNIONECTOMY Bilateral   . CARPAL TUNNEL RELEASE Right    x 2  . cataract surgery  Bilateral   . CERVICAL FUSION    . COLONOSCOPY    . ESOPHAGOGASTRODUODENOSCOPY    . EXPLORATORY LAPAROTOMY    . left middle finger surgery     pin- arthritic cyst  . LUMBAR LAMINECTOMY/DECOMPRESSION MICRODISCECTOMY Right 05/04/2015   Procedure: RIGHT LUMBAR TWO-THREE/THREE-FOUR LAMINECTOMY/DECOMPRESSION ;  Surgeon: Newman Pies, MD;  Location: [facility];  Service: Neurosurgery;  Laterality: Right;  Right L23 L34 laminectomy and foraminotomy  . Nissan fundoplication    . TONSILLECTOMY      Allergies  Allergen Reactions  . Erythromycin Base Nausea Only  . Hydrocodone Nausea Only  . Oxycodone-Acetaminophen Itching  . Talwin [Pentazocine] Nausea And Vomiting  . Vicodin [Hydrocodone-Acetaminophen] Nausea And Vomiting  . Vioxx [Rofecoxib] Nausea And Vomiting    Social History  Substance Use Topics  . Smoking status: Never Smoker  . Smokeless tobacco: Not on file  . Alcohol use Yes     Comment: wine rarely    Family History  Problem Relation Age of Onset  . Breast cancer Mother   . Breast cancer Maternal Aunt   . Diabetes Maternal Aunt   .  Hypertension Father   . Diabetes Maternal Uncle   . Ovarian cancer Neg Hx   . Colon cancer Neg Hx    Prior to Admission medications   Medication Sig Start Date End Date Taking? Authorizing Provider  Ascorbic Acid (VITAMIN C) 1000 MG tablet Take 1,000 mg by mouth daily as needed (pain).   Yes Historical Provider, MD  aspirin EC 81 MG tablet Take 1 tablet (81 mg total) by mouth daily. Patient taking differently: Take 81 mg by mouth daily. Will stop prior to procedure 11/14/14  Yes Consuella Lose, MD  cholecalciferol (VITAMIN D) 1000 UNITS tablet Take 1,000 Units by mouth daily.    Yes Historical Provider, MD  docusate sodium (COLACE) 100 MG capsule Take 200 mg by mouth 2 (two) times daily.    Yes Historical Provider, MD  gabapentin (NEURONTIN) 300 MG capsule Take 300 mg by mouth at bedtime.    Yes Historical Provider, MD  lansoprazole  (PREVACID) 30 MG capsule Take 30 mg by mouth 2 (two) times daily before a meal.   Yes Historical Provider, MD  losartan (COZAAR) 100 MG tablet Take 100 mg by mouth every evening.   Yes Historical Provider, MD  Magnesium 250 MG TABS Take 250 mg by mouth daily.    Yes Historical Provider, MD  metoprolol succinate (TOPROL-XL) 25 MG 24 hr tablet Take 25 mg by mouth 2 (two) times daily. Patient takes twice daily. Called pharmacy and verified and that is correct.   Yes Historical Provider, MD  Multiple Vitamin (MULTIVITAMIN WITH MINERALS) TABS tablet Take 1 tablet by mouth daily.   Yes Historical Provider, MD  naproxen sodium (ANAPROX) 220 MG tablet Take 220 mg by mouth 2 (two) times daily as needed.   Yes Historical Provider, MD  traMADol (ULTRAM) 50 MG tablet Take 25-50 mg by mouth daily as needed for moderate pain. Depends on pain if takes 25-50 mg   Yes Historical Provider, MD  albuterol (PROAIR HFA) 108 (90 BASE) MCG/ACT inhaler Inhale 2 puffs into the lungs every 4 (four) hours as needed for wheezing or shortness of breath.     Historical Provider, MD  Calcium Carbonate-Vitamin D (CALTRATE 600+D PO) Take 1 tablet by mouth daily.    Historical Provider, MD     Review of Systems  Positive ROS: As above  All other systems have been reviewed and were otherwise negative with the exception of those mentioned in the HPI and as above.  Objective: Vital signs in last 24 hours: Temp:  [98.7 F (37.1 C)] 98.7 F (37.1 C) (02/01 0625) Pulse Rate:  [58] 58 (02/01 0625) Resp:  [20] 20 (02/01 0625) BP: (168)/(86) 168/86 (02/01 0625) SpO2:  [96 %] 96 % (02/01 0625)  General Appearance: Alert Head: Normocephalic, without obvious abnormality, atraumatic Eyes: PERRL, conjunctiva/corneas clear, EOM's intact,    Ears: Normal  Throat: Normal  Neck: Supple, Back: unremarkable Lungs: Clear to auscultation bilaterally, respirations unlabored Heart: Regular rate and rhythm, no murmur, rub or  gallop Abdomen: Soft, non-tender Extremities: Extremities normal, atraumatic, no cyanosis or edema Skin: unremarkable  NEUROLOGIC:   Mental status: alert and oriented,Motor Exam - grossly normal Sensory Exam - grossly normal Reflexes:  Coordination - grossly normal Gait - grossly normal Balance - grossly normal Cranial Nerves: I: smell Not tested  II: visual acuity  OS: Normal  OD: Normal   II: visual fields Full to confrontation  II: pupils Equal, round, reactive to light  III,VII: ptosis None  III,IV,VI: extraocular muscles  Full ROM  V: mastication Normal  V: facial light touch sensation  Normal  V,VII: corneal reflex  Present  VII: facial muscle function - upper  Normal  VII: facial muscle function - lower Normal  VIII: hearing Not tested  IX: soft palate elevation  Normal  IX,X: gag reflex Present  XI: trapezius strength  5/5  XI: sternocleidomastoid strength 5/5  XI: neck flexion strength  5/5  XII: tongue strength  Normal    Data Review Lab Results  Component Value Date   WBC 5.8 09/30/2016   HGB 13.1 09/30/2016   HCT 39.5 09/30/2016   MCV 90.8 09/30/2016   PLT 169 09/30/2016   Lab Results  Component Value Date   NA 139 09/30/2016   K 3.6 09/30/2016   CL 104 09/30/2016   CO2 27 09/30/2016   BUN 15 09/30/2016   CREATININE 1.23 (H) 09/30/2016   GLUCOSE 115 (H) 09/30/2016   No results found for: INR, PROTIME  Assessment/Plan: C4-5 and C6-7 disc degeneration, spondylosis, stenosis, cervicalgia, cervical radiculopathy: I have discussed the situation with the patient. I have reviewed her imaging studies with her and pointed out the abnormalities. We have discussed the various treatment options including surgery. I have described the surgical treatment option of a C4-5 and C6-7 anterior cervical discectomy, fusion, and plating. I have shown her surgical models. We have discussed the risks, benefits, alternatives, expected postoperative course, and likelihood of  achieving her goals with surgery. I have answered all her questions. She has decided to proceed with surgery.   Clarita Mcelvain D 10/06/2016 7:26 AM

## 2016-10-06 NOTE — Anesthesia Postprocedure Evaluation (Signed)
Anesthesia Post Note  Patient: Megan Daniels  Procedure(s) Performed: Procedure(s) (LRB): ANTERIOR CERVICAL DECOMPRESSION/DISCECTOMY FUSION , INTERBODY PROSTHESIS, PLATE CERVICAL FOUR CERVICAL FIVE, CERVICAL SIX- CERVICAL SEVEN (N/A)  Patient location during evaluation: PACU Anesthesia Type: General Level of consciousness: awake and alert Pain management: pain level controlled Vital Signs Assessment: post-procedure vital signs reviewed and stable Respiratory status: spontaneous breathing, nonlabored ventilation, respiratory function stable and patient connected to nasal cannula oxygen Cardiovascular status: blood pressure returned to baseline and stable Postop Assessment: no signs of nausea or vomiting Anesthetic complications: no       Last Vitals:  Vitals:   10/06/16 1144 10/06/16 1145  BP: 110/67   Pulse: 64 (!) 59  Resp: 18 16  Temp: 36.2 C     Last Pain:  Vitals:   10/06/16 1122  TempSrc:   PainSc: 7                  Kabella Cassidy A.

## 2016-10-06 NOTE — Op Note (Signed)
Brief history: The patient is a 66 year old white female who's had a prior C5-6 anterior cervical discectomy and fusion by another physician years ago. She has developed recurrent neck, shoulder and arm pain consistent with a cervical radiculopathy. She has failed medical management and was worked up with a cervical MRI. This demonstrated disc degeneration, spondylosis and stenosis at C4-5 and C6-7. I discussed the various treatment options with the patient including surgery. She has weighed the risks, benefits, and alternatives to surgery and decided to proceed with a C4-5 and C6-7 anterior cervical discectomy, fusion, and plating.  Preoperative diagnosis: C4-5 and C6-7 disc degeneration, spondylosis, stenosis, cervicalgia, cervical radiculopathy  Postoperative diagnosis: Same  Procedure: C4-5 and C6-7 Anterior cervical discectomy/decompression; C4-5 and C6-7 interbody arthrodesis with local morcellized autograft bone and Kinnex bone graft extender; insertion of interbody prosthesis at C4-5 and C6-7 (Zimmer peek interbody prosthesis); anterior cervical plating from C4-5 and C6-7 with globus titanium plate  Surgeon: Dr. Earle Gell  Asst.: Dr. Ellene Route  Anesthesia: Gen. endotracheal  Estimated blood loss: 125 mL  Drains: None  Complications: None  Description of procedure: The patient was brought to the operating room by the anesthesia team. General endotracheal anesthesia was induced. A roll was placed under the patient's shoulders to keep the neck in the neutral position. The patient's anterior cervical region was then prepared with Betadine scrub and Betadine solution. Sterile drapes were applied.  The area to be incised was then injected with Marcaine with epinephrine solution. I then used a scalpel to make a transverse incision in the patient's left anterior neck. I used the Metzenbaum scissors to divide the platysmal muscle and then to dissect medial to the sternocleidomastoid muscle,  jugular vein, and carotid artery, carefully dissecting through some scar tissue from the previous operation. I carefully dissected down towards the anterior cervical spine identifying the esophagus and retracting it medially. Then using Kitner swabs to clear soft tissue from the anterior cervical spine. We then inserted a bent spinal needle into the upper exposed intervertebral disc space. We then obtained intraoperative radiographs confirm our location.  I then used electrocautery to detach the medial border of the longus colli muscle bilaterally from the C4-5 and C6-7 intervertebral disc spaces. I then inserted the Caspar self-retaining retractor underneath the longus colli muscle bilaterally to provide exposure.  We then incised the intervertebral disc at C4-5. We then performed a partial intervertebral discectomy with a pituitary forceps and the Karlin curettes. I then inserted distraction screws into the vertebral bodies at C4-5. We then distracted the interspace. We then used the high-speed drill to decorticate the vertebral endplates at [id number], to drill away the remainder of the intervertebral disc, to drill away some posterior spondylosis, and to thin out the posterior longitudinal ligament. I then incised ligament with the arachnoid knife. We then removed the ligament with a Kerrison punches undercutting the vertebral endplates and decompressing the thecal sac. We then performed foraminotomies about the bilateral C5 nerve roots. This completed the decompression at this level.  We then repeated this procedure in analogous fashion at C6-7 on the decompressing the thecal sac and bilateral C7 nerve roots.  We now turned our to attention to the interbody fusion. We used the trial spacers to determine the appropriate size for the interbody prosthesis. We then pre-filled prosthesis with a combination of local morcellized autograft bone that we obtained during decompression as well as Kinnex bone graft  extender. We then inserted the prosthesis into the distracted interspace at C4-5 and  C6-7. We then removed the distraction screws. There was a good snug fit of the prosthesis in the interspace.  Having completed the fusion we now turned attention to the anterior spinal instrumentation. We used the high-speed drill to drill away some anterior spondylosis at the disc spaces so that the plate lay down flat. We selected the appropriate length titanium anterior cervical plate. We laid it along the anterior aspect of the vertebral bodies from C4-5 and C6-7. We then drilled 12 mm holes at C4, C5, C6 and C7. We then secured the plate to the vertebral bodies by placing two 12 mm self-tapping screws at C4, C5, C6 and C7. We then obtained intraoperative radiograph. The demonstrating good position of the instrumentation at C4-5, we could not see the instrumentation at C6-7 because of the patient's shoulders. It looked good in vivo. We therefore secured the screws the plate the locking each cam. This completed the instrumentation.  We then obtained hemostasis using bipolar electrocautery. We irrigated the wound out with bacitracin solution. We then removed the retractor. We inspected the esophagus for any damage. There was none apparent. We then reapproximated patient's platysmal muscle with interrupted 3-0 Vicryl suture. We then reapproximated the subcutaneous tissue with interrupted 3-0 Vicryl suture. The skin was reapproximated with Steri-Strips and benzoin. The wound was then covered with bacitracin ointment. A sterile dressing was applied. The drapes were removed. Patient was subsequently extubated by the anesthesia team and transported to the post anesthesia care unit in stable condition. All sponge instrument and needle counts were reportedly correct at the end of this case.

## 2016-10-07 ENCOUNTER — Encounter (HOSPITAL_COMMUNITY): Payer: Self-pay | Admitting: Neurosurgery

## 2016-10-07 DIAGNOSIS — M50121 Cervical disc disorder at C4-C5 level with radiculopathy: Secondary | ICD-10-CM | POA: Diagnosis not present

## 2016-10-07 MED ORDER — HYDROMORPHONE HCL 4 MG PO TABS: 4.0000 mg | ORAL_TABLET | ORAL | 0 refills | Status: DC | PRN

## 2016-10-07 NOTE — Progress Notes (Signed)
Pt. Placed on CPAP with own Nasal Pillows, humidity filled, placed 2 lpm Oxygen into circuit, set per HR @ 7 CMH20, tolerating well.

## 2016-10-07 NOTE — Discharge Summary (Signed)
Physician Discharge Summary  Patient ID: Megan Daniels MRN: [id number] DOB/AGE: September 09, 1950 66 y.o.  Admit date: 10/06/2016 Discharge date: 10/07/2016  Admission Diagnoses:C4-5 and C6-7 disc degeneration, spondylosis, stenosis, cervical radiculopathy, cervicalgia  Discharge Diagnoses: The same Active Problems:   Cervical spondylosis with radiculopathy   Discharged Condition: good  Hospital Course: I performed a C4-5 and C6-7 anterior cervical discectomy, fusion, and plating on the patient on to 118. The surgery went well.  The patient's postoperative course was unremarkable. On postoperative day #1 the patient requested discharge home. She was given written and oral discharges reactions. All the patient's, and her daughters, questions were answered.  Consults: None Significant Diagnostic Studies: None Treatments: C4-5 and C6-7 anterior cervical discectomy, fusion, and plating. Discharge Exam: Blood pressure 112/72, pulse 63, temperature 98.6 F (37 C), resp. rate 18, SpO2 94 %. The patient is alert and pleasant. She looks well. Her dressing is clean and dry. There is no hematoma or shift. Her strength is normal in all 4 extremities.  Disposition: Home  Discharge Instructions    Call MD for:  difficulty breathing, headache or visual disturbances    Complete by:  As directed    Call MD for:  extreme fatigue    Complete by:  As directed    Call MD for:  hives    Complete by:  As directed    Call MD for:  persistant dizziness or light-headedness    Complete by:  As directed    Call MD for:  persistant nausea and vomiting    Complete by:  As directed    Call MD for:  redness, tenderness, or signs of infection (pain, swelling, redness, odor or green/yellow discharge around incision site)    Complete by:  As directed    Call MD for:  severe uncontrolled pain    Complete by:  As directed    Call MD for:  temperature >100.4    Complete by:  As directed    Diet - low sodium heart  healthy    Complete by:  As directed    Discharge instructions    Complete by:  As directed    Call [phone] for a followup appointment. Take a stool softener while you are using pain medications.   Driving Restrictions    Complete by:  As directed    Do not drive for 2 weeks.   Increase activity slowly    Complete by:  As directed    Lifting restrictions    Complete by:  As directed    Do not lift more than 5 pounds. No excessive bending or twisting.   May shower / Bathe    Complete by:  As directed    He may shower after the pain she is removed 3 days after surgery. Leave the incision alone.   Remove dressing in 48 hours    Complete by:  As directed    Your stitches are under the scan and will dissolve by themselves. The Steri-Strips will fall off after you take a few showers. Do not rub back or pick at the wound, Leave the wound alone.     Allergies as of 10/07/2016      Reactions   Erythromycin Base Nausea Only   Hydrocodone Nausea Only   Oxycodone-acetaminophen Itching   Talwin [pentazocine] Nausea And Vomiting   Vicodin [hydrocodone-acetaminophen] Nausea And Vomiting   Vioxx [rofecoxib] Nausea And Vomiting      Medication List    STOP taking these  medications   naproxen sodium 220 MG tablet Commonly known as:  ANAPROX     TAKE these medications   aspirin EC 81 MG tablet Take 1 tablet (81 mg total) by mouth daily. What changed:  additional instructions   CALTRATE 600+D PO Take 1 tablet by mouth daily.   cholecalciferol 1000 units tablet Commonly known as:  VITAMIN D Take 1,000 Units by mouth daily.   docusate sodium 100 MG capsule Commonly known as:  COLACE Take 200 mg by mouth 2 (two) times daily.   gabapentin 300 MG capsule Commonly known as:  NEURONTIN Take 300 mg by mouth at bedtime.   HYDROmorphone 4 MG tablet Commonly known as:  DILAUDID Take 1 tablet (4 mg total) by mouth every 4 (four) hours as needed for severe pain.   lansoprazole 30 MG  capsule Commonly known as:  PREVACID Take 30 mg by mouth 2 (two) times daily before a meal.   losartan 100 MG tablet Commonly known as:  COZAAR Take 100 mg by mouth every evening.   Magnesium 250 MG Tabs Take 250 mg by mouth daily.   metoprolol succinate 25 MG 24 hr tablet Commonly known as:  TOPROL-XL Take 25 mg by mouth 2 (two) times daily. Patient takes twice daily. Called pharmacy and verified and that is correct.   multivitamin with minerals Tabs tablet Take 1 tablet by mouth daily.   PROAIR HFA 108 (90 Base) MCG/ACT inhaler Generic drug:  albuterol Inhale 2 puffs into the lungs every 4 (four) hours as needed for wheezing or shortness of breath.   traMADol 50 MG tablet Commonly known as:  ULTRAM Take 25-50 mg by mouth daily as needed for moderate pain. Depends on pain if takes 25-50 mg   vitamin C 1000 MG tablet Take 1,000 mg by mouth daily as needed (pain).        SignedNewman Pies D 10/07/2016, 8:09 AM

## 2016-10-10 ENCOUNTER — Emergency Department (HOSPITAL_COMMUNITY)
Admission: EM | Admit: 2016-10-10 | Discharge: 2016-10-11 | Disposition: A | Discharge status: Home / Self Care | Payer: Medicare Other | Attending: Emergency Medicine | Admitting: Emergency Medicine

## 2016-10-10 ENCOUNTER — Encounter (HOSPITAL_COMMUNITY): Payer: Self-pay | Admitting: Emergency Medicine

## 2016-10-10 ENCOUNTER — Emergency Department (HOSPITAL_COMMUNITY): Payer: Medicare Other

## 2016-10-10 DIAGNOSIS — Z79899 Other long term (current) drug therapy: Secondary | ICD-10-CM | POA: Insufficient documentation

## 2016-10-10 DIAGNOSIS — Z7982 Long term (current) use of aspirin: Secondary | ICD-10-CM | POA: Diagnosis not present

## 2016-10-10 DIAGNOSIS — J449 Chronic obstructive pulmonary disease, unspecified: Secondary | ICD-10-CM | POA: Insufficient documentation

## 2016-10-10 DIAGNOSIS — Z85828 Personal history of other malignant neoplasm of skin: Secondary | ICD-10-CM | POA: Insufficient documentation

## 2016-10-10 DIAGNOSIS — M542 Cervicalgia: Secondary | ICD-10-CM | POA: Diagnosis present

## 2016-10-10 DIAGNOSIS — G8918 Other acute postprocedural pain: Secondary | ICD-10-CM | POA: Diagnosis not present

## 2016-10-10 LAB — COMPREHENSIVE METABOLIC PANEL
ALBUMIN: 3.7 g/dL (ref 3.5–5.0)
ALK PHOS: 79 U/L (ref 38–126)
ALT: 14 U/L (ref 14–54)
ANION GAP: 10 (ref 5–15)
AST: 24 U/L (ref 15–41)
BUN: 11 mg/dL (ref 6–20)
CALCIUM: 9.4 mg/dL (ref 8.9–10.3)
CO2: 29 mmol/L (ref 22–32)
Chloride: 97 mmol/L — ABNORMAL LOW (ref 101–111)
Creatinine, Ser: 1.21 mg/dL — ABNORMAL HIGH (ref 0.44–1.00)
GFR, EST AFRICAN AMERICAN: 53 mL/min — AB (ref >60)
GFR, EST NON AFRICAN AMERICAN: 46 mL/min — AB (ref >60)
Glucose, Bld: 106 mg/dL — ABNORMAL HIGH (ref 65–99)
POTASSIUM: 4.2 mmol/L (ref 3.5–5.1)
Sodium: 136 mmol/L (ref 135–145)
TOTAL PROTEIN: 6.9 g/dL (ref 6.5–8.1)
Total Bilirubin: 0.8 mg/dL (ref 0.3–1.2)

## 2016-10-10 LAB — CBC WITH DIFFERENTIAL/PLATELET
BASOS ABS: 0 10*3/uL (ref 0.0–0.1)
Basophils Relative: 0 %
EOS ABS: 0.4 10*3/uL (ref 0.0–0.7)
Eosinophils Relative: 4 %
HEMATOCRIT: 40 % (ref 36.0–46.0)
HEMOGLOBIN: 13.4 g/dL (ref 12.0–15.0)
Lymphocytes Relative: 26 %
Lymphs Abs: 2.1 10*3/uL (ref 0.7–4.0)
MCH: 29.8 pg (ref 26.0–34.0)
MCHC: 33.5 g/dL (ref 30.0–36.0)
MCV: 89.1 fL (ref 78.0–100.0)
Monocytes Absolute: 0.7 10*3/uL (ref 0.1–1.0)
Monocytes Relative: 9 %
NEUTROS ABS: 5.1 10*3/uL (ref 1.7–7.7)
NEUTROS PCT: 61 %
Platelets: 195 10*3/uL (ref 150–400)
RBC: 4.49 MIL/uL (ref 3.87–5.11)
RDW: 12.6 % (ref 11.5–15.5)
WBC: 8.3 10*3/uL (ref 4.0–10.5)

## 2016-10-10 LAB — URINALYSIS, ROUTINE W REFLEX MICROSCOPIC
BILIRUBIN URINE: NEGATIVE
Glucose, UA: NEGATIVE mg/dL
Hgb urine dipstick: NEGATIVE
KETONES UR: 5 mg/dL — AB
LEUKOCYTES UA: NEGATIVE
NITRITE: NEGATIVE
PH: 7 (ref 5.0–8.0)
Protein, ur: NEGATIVE mg/dL
Specific Gravity, Urine: 1.015 (ref 1.005–1.030)

## 2016-10-10 LAB — I-STAT CG4 LACTIC ACID, ED: LACTIC ACID, VENOUS: 0.98 mmol/L (ref 0.5–1.9)

## 2016-10-10 MED ORDER — ACETAMINOPHEN 325 MG PO TABS: 325.0000 mg | ORAL_TABLET | Freq: Once | ORAL | Status: DC

## 2016-10-10 MED ORDER — ACETAMINOPHEN 325 MG PO TABS
ORAL_TABLET | ORAL | Status: AC
  Filled 2016-10-10: qty 2

## 2016-10-10 MED ORDER — ACETAMINOPHEN 500 MG PO TABS: 1000.0000 mg | ORAL_TABLET | Freq: Once | ORAL | Status: DC

## 2016-10-10 MED ORDER — FENTANYL CITRATE (PF) 100 MCG/2ML IJ SOLN
50.0000 ug | Freq: Once | INTRAMUSCULAR | Status: AC
  Administered 2016-10-10: 50 ug via INTRAVENOUS
  Filled 2016-10-10: qty 2

## 2016-10-10 MED ORDER — SODIUM CHLORIDE 0.9 % IV BOLUS (SEPSIS)
1000.0000 mL | Freq: Once | INTRAVENOUS | Status: AC
  Administered 2016-10-10: 1000 mL via INTRAVENOUS

## 2016-10-10 NOTE — ED Triage Notes (Addendum)
Had surgery on her Thursday and went home Friday started to have trouble swallowing since she  Came home and feels hoarse, has run a fever aand has a h/a, pt handling her secreations  States took a dilaudid at 4 pm and she could swallow that

## 2016-10-10 NOTE — ED Provider Notes (Signed)
By signing my name below, I, Evelene Croon, attest that this documentation has been prepared under the direction and in the presence of Gracey, DO . Electronically Signed: Evelene Croon, Scribe. 10/10/2016. 11:26 PM.  TIME SEEN: 11:15 PM  CHIEF COMPLAINT:  Chief Complaint  Patient presents with  . Neck Pain  . Fever     HPI:  HPI Comments:  Megan Daniels is a 66 y.o. female who presents to the Emergency Department complaining of increasing pain to her anterior neck near her surgical site x 3-4 days with associated difficulty swallowing and sore throat, and fever.  She has been able to swallow her secretions and medications. Patient underwent C4-5 and C6-7 anterior cervical discectomy/decompression; C4-5 and C6-7 interbody arthrodesis with local morcellized autograft bone and Kinnex bone graft extender; insertion of interbody prosthesis at C4-5 and C6-7 (Zimmer peek interbody prosthesis); anterior cervical plating from C4-5 and C6-7 with globus titanium plate on 075-GRM with Dr. Arnoldo Morale. Pt states she had a similar procedure 20 years ago and her recovery this time has been slightly difficult in comparison.  She reports occasional cough due to her difficulty swallowing. Pt denies acute numbness/weakness/tingling, vomiting and diarrhea. She also notes some constipation but was able to have a bowel movement today. She has taken Dilaudid, last dose ~1600, with minimal relief of pain. Pt called Dr. Arnoldo Morale office and was advised to come to the ED for further evaluation.    ROS: See HPI Constitutional: fever  Eyes: no drainage  ENT: no runny nose   Cardiovascular:  no chest pain  Resp: no SOB  GI: no vomiting or diarrhea GU: no dysuria Integumentary: no rash  Allergy: no hives  Musculoskeletal: no leg swelling  Neurological: no slurred speech ROS otherwise negative  PAST MEDICAL HISTORY/PAST SURGICAL HISTORY:  Past Medical History:  Diagnosis Date  . Abnormal Pap smear of cervix     pos hpv-   . Acid reflux   . Arthritis   . Asthma    Symbicort as needed  . Cancer ([place])    skin  . Chalazion of left eye   . Chronic back pain    spondylolisthesis/stenosis  . Chronic UTI    was treated for 3 months with Macrobid and in Dec everything was fine  . Constipation   . COPD (chronic obstructive pulmonary disease) ([place])   . Diverticulosis   . Elevated serum creatinine    with nsaids  . Foot drop    right  . Genital warts    h/o  . GERD (gastroesophageal reflux disease)    takes Prevacid daily  . Headache   . History of bronchitis   . History of colon polyps   . History of hiatal hernia   . Hypertension    takes Losartan daily  . Joint pain   . Palpitations    takes Metoprolol daily  . Pneumonia    hx of-early 2000's  . PONV (postoperative nausea and vomiting)    extreme nausea and vomiting -none last surgery 10/29/14  . Sarcoidosis ([place])   . Seasonal allergies    takes Claritin daily  . Shortness of breath dyspnea   . Sleep apnea    cpap 10 yrs  . Stress incontinence   . URI (upper respiratory infection)    treated self with Mucinex and no fever  . Urinary urgency   . Vitamin D deficiency   . Weakness    numbness and tingling in both legs  MEDICATIONS:  Prior to Admission medications   Medication Sig Start Date End Date Taking? Authorizing Provider  albuterol (PROAIR HFA) 108 (90 BASE) MCG/ACT inhaler Inhale 2 puffs into the lungs every 4 (four) hours as needed for wheezing or shortness of breath.     Historical Provider, MD  Ascorbic Acid (VITAMIN C) 1000 MG tablet Take 1,000 mg by mouth daily as needed (pain).    Historical Provider, MD  aspirin EC 81 MG tablet Take 1 tablet (81 mg total) by mouth daily. Patient taking differently: Take 81 mg by mouth daily. Will stop prior to procedure 11/14/14   Consuella Lose, MD  Calcium Carbonate-Vitamin D (CALTRATE 600+D PO) Take 1 tablet by mouth daily.    Historical Provider, MD  cholecalciferol  (VITAMIN D) 1000 UNITS tablet Take 1,000 Units by mouth daily.     Historical Provider, MD  docusate sodium (COLACE) 100 MG capsule Take 200 mg by mouth 2 (two) times daily.     Historical Provider, MD  gabapentin (NEURONTIN) 300 MG capsule Take 300 mg by mouth at bedtime.     Historical Provider, MD  HYDROmorphone (DILAUDID) 4 MG tablet Take 1 tablet (4 mg total) by mouth every 4 (four) hours as needed for severe pain. 10/07/16   Newman Pies, MD  lansoprazole (PREVACID) 30 MG capsule Take 30 mg by mouth 2 (two) times daily before a meal.    Historical Provider, MD  losartan (COZAAR) 100 MG tablet Take 100 mg by mouth every evening.    Historical Provider, MD  Magnesium 250 MG TABS Take 250 mg by mouth daily.     Historical Provider, MD  metoprolol succinate (TOPROL-XL) 25 MG 24 hr tablet Take 25 mg by mouth 2 (two) times daily. Patient takes twice daily. Called pharmacy and verified and that is correct.    Historical Provider, MD  Multiple Vitamin (MULTIVITAMIN WITH MINERALS) TABS tablet Take 1 tablet by mouth daily.    Historical Provider, MD  traMADol (ULTRAM) 50 MG tablet Take 25-50 mg by mouth daily as needed for moderate pain. Depends on pain if takes 25-50 mg    Historical Provider, MD    ALLERGIES:  Allergies  Allergen Reactions  . Erythromycin Base Nausea Only  . Hydrocodone Nausea Only  . Oxycodone-Acetaminophen Itching  . Talwin [Pentazocine] Nausea And Vomiting  . Vicodin [Hydrocodone-Acetaminophen] Nausea And Vomiting  . Vioxx [Rofecoxib] Nausea And Vomiting    SOCIAL HISTORY:  Social History  Substance Use Topics  . Smoking status: Never Smoker  . Smokeless tobacco: Never Used  . Alcohol use Yes     Comment: wine rarely    FAMILY HISTORY: Family History  Problem Relation Age of Onset  . Breast cancer Mother   . Breast cancer Maternal Aunt   . Diabetes Maternal Aunt   . Hypertension Father   . Diabetes Maternal Uncle   . Ovarian cancer Neg Hx   . Colon cancer  Neg Hx     EXAM: BP 101/55   Pulse 66   Temp 98.7 F (37.1 C) (Oral)   Resp 20   SpO2 99%  CONSTITUTIONAL: Alert and oriented and responds appropriately to questions. Well-appearing; well-nourished, Nontoxic-appearing HEAD: Normocephalic EYES: Conjunctivae clear, PERRL, EOMI ENT: normal nose; no rhinorrhea; moist mucous membranes; No pharyngeal erythema or petechiae, patient is status post tonsillectomy, no uvular deviation, no unilateral swelling, no trismus or drooling, no muffled voice, normal phonation, no stridor, no dental caries present, no drainable dental abscess noted, no Ludwig's angina,  tongue sits flat in the bottom of the mouth, no angioedema, no facial erythema or warmth, no facial swelling; no pain with movement of the neck NECK: Supple, no meningismus, no nuchal rigidity, patient does have some mild bilateral cervical anterior lymphadenopathy. No significant midline C- spine tenderness. In aspen collar; 4cm surgical incision site to left anterior neck with moderate associated swelling, tenderness, and without erythema, warmth, or drainage.  No induration or fluctuance to this area. CARD: RRR; S1 and S2 appreciated; no murmurs, no clicks, no rubs, no gallops RESP: Normal chest excursion without splinting or tachypnea; breath sounds clear and equal bilaterally; no wheezes, no rhonchi, no rales, no hypoxia or respiratory distress, speaking full sentences ABD/GI: Normal bowel sounds; non-distended; soft, non-tender, no rebound, no guarding, no peritoneal signs, no hepatosplenomegaly BACK:  The back appears normal and is non-tender to palpation, there is no CVA tenderness EXT: Normal ROM in all joints; non-tender to palpation; no edema; normal capillary refill; no cyanosis, no calf tenderness or swelling    SKIN: Normal color for age and race; warm; no rash NEURO: Moves all extremities equally, sensation to light touch intact diffusely, cranial nerves II through XII intact, normal  speech PSYCH: The patient's mood and manner are appropriate. Grooming and personal hygiene are appropriate.    MEDICAL DECISION MAKING: Patient here with fever on postop day 4 after anterior cervical discectomy by Dr. Arnoldo Morale. Has an area of swelling and tenderness over her surgical site on the anterior left neck. She has no new focal neurologic deficits. Reports that her posterior neck pain has been well controlled. No sign of airway compromise, swelling of her oropharynx, lips or tongue. Her voice is normal. Swallowing her secretions but states it is uncomfortable. We'll obtain CT scan of the soft tissues of her neck with IV contrast to evaluate for abscess, deep space neck infection. We'll also obtain a CT of the cervical spine to ensure hardware intact, no acute abnormality after her surgery. I have low suspicion for discitis, epidural abscess or hematoma, spinal stenosis. We'll give fentanyl for pain. Her labs ordered in triage have been unremarkable. No leukocytosis. Normal lactate. Urine shows no sign of infection. Chest x-ray clear.  She does not meet sepsis/SIRs criteria at this time. I feel we can hold antibiotics until after imaging, consult with neurosurgery.  ED PROGRESS: 2:50 AM  Pt's flu swab is negative. CT scan shows left anterolateral neck and prevertebral fluid collection without peripheral enhancement which is probably postoperative but early infection cannot be excluded. Hardware appears intact. We'll discuss with neurosurgery on call for further recommendations.  2:54 AM Pt reassessed at this time. Patient reports feeling better.   3:00 AM  D/w Dr. Trenton Gammon with neurosurgery was on call for Dr. Arnoldo Morale. We appreciate his help. Discussed patient's imaging, lab work. Discussed with him that she frequently has fever after surgery. She states is a normal thing for her. He feels this is less likely infection and would recommend holding on antibiotics at this time. She is comfortable with  plan to be discharged home and follow-up with Dr. Arnoldo Morale in the morning. She will call the office at 8 AM. Discussed at length strict return precautions including changes in her voice, difficulty breathing or unable to swallow, drooling. She has been monitored for several hours without worsening of the swelling. It does not appear to be a large expanding hematoma. Again low suspicion for infection per Dr Trenton Gammon and patient agrees. She also does not think this is  infection and does not feel she needs antibiotics. Repeatedly states that fever is normal for her after surgery. Patient and family verbalize understanding and are comfortable with this plan.   At this time, I do not feel there is any life-threatening condition present. I have reviewed and discussed all results (EKG, imaging, lab, urine as appropriate) and exam findings with patient/family. I have reviewed nursing notes and appropriate previous records.  I feel the patient is safe to be discharged home without further emergent workup and can continue workup as an outpatient as needed. Discussed usual and customary return precautions. Patient/family verbalize understanding and are comfortable with this plan.  Outpatient follow-up has been provided. All questions have been answered.   I personally performed the services described in this documentation, which was scribed in my presence. The recorded information has been reviewed and is accurate.     [place], DO 10/11/16 [phone]

## 2016-10-11 ENCOUNTER — Emergency Department (HOSPITAL_COMMUNITY): Payer: Medicare Other

## 2016-10-11 ENCOUNTER — Encounter (HOSPITAL_COMMUNITY): Payer: Self-pay | Admitting: Radiology

## 2016-10-11 DIAGNOSIS — M542 Cervicalgia: Secondary | ICD-10-CM | POA: Diagnosis not present

## 2016-10-11 LAB — INFLUENZA PANEL BY PCR (TYPE A & B)
Influenza A By PCR: NEGATIVE
Influenza B By PCR: NEGATIVE

## 2016-10-11 MED ORDER — IOPAMIDOL (ISOVUE-300) INJECTION 61%
INTRAVENOUS | Status: AC
  Administered 2016-10-11: 75 mL
  Filled 2016-10-11: qty 75

## 2016-10-11 NOTE — Discharge Instructions (Signed)
Please follow-up with your neurosurgeon in the next 1-2 days. If you began having difficulty breathing, changes in your voice, this area of swelling appears to be getting worse for you, you were unable to swallow, have any drainage of pus or bleeding from this surgical site, please return to the emergency department. Your CT scan showed an area of fluid collection which is likely a normal postoperative fluid collection and less likely infection. The on-call neurosurgeon did not want [place] to start you on antibiotics at this time but one enclosed follow-up with your neurosurgeon. Your lab work, chest x-ray and urine today were normal. Please continue your pain medication as prescribed.

## 2016-10-11 NOTE — ED Notes (Signed)
Patient verbalized understanding of discharge instructions and denies any further needs or questions at this time. VS stable. Patient ambulatory with steady gait.  

## 2016-10-11 NOTE — ED Notes (Signed)
Patient placed on 2L O2 [place] d/t SpO2 dropping to 89-91% RA.

## 2016-10-11 NOTE — ED Notes (Signed)
Pt returned from CT and placed back on monitor.

## 2016-10-12 LAB — URINE CULTURE

## 2016-10-15 LAB — CULTURE, BLOOD (ROUTINE X 2): CULTURE: NO GROWTH

## 2016-11-03 ENCOUNTER — Other Ambulatory Visit: Payer: Self-pay | Admitting: Otolaryngology

## 2016-11-03 DIAGNOSIS — H918X1 Other specified hearing loss, right ear: Secondary | ICD-10-CM

## 2016-11-03 DIAGNOSIS — H9311 Tinnitus, right ear: Secondary | ICD-10-CM

## 2016-11-03 DIAGNOSIS — IMO0001 Reserved for inherently not codable concepts without codable children: Secondary | ICD-10-CM

## 2016-11-11 ENCOUNTER — Ambulatory Visit
Admission: RE | Admit: 2016-11-11 | Discharge: 2016-11-11 | Disposition: A | Discharge status: Home / Self Care | Payer: Medicare Other | Source: Ambulatory Visit | Attending: Otolaryngology | Admitting: Otolaryngology

## 2016-11-11 ENCOUNTER — Other Ambulatory Visit: Admission: RE | Admit: 2016-11-11 | Payer: Medicare Other | Source: Ambulatory Visit | Admitting: Otolaryngology

## 2016-11-11 DIAGNOSIS — H9311 Tinnitus, right ear: Secondary | ICD-10-CM

## 2016-11-11 DIAGNOSIS — H903 Sensorineural hearing loss, bilateral: Secondary | ICD-10-CM | POA: Insufficient documentation

## 2016-11-11 DIAGNOSIS — H918X1 Other specified hearing loss, right ear: Secondary | ICD-10-CM

## 2016-11-11 DIAGNOSIS — IMO0001 Reserved for inherently not codable concepts without codable children: Secondary | ICD-10-CM

## 2016-11-11 MED ORDER — GADOBENATE DIMEGLUMINE 529 MG/ML IV SOLN
15.0000 mL | Freq: Once | INTRAVENOUS | Status: AC | PRN
  Administered 2016-11-11: 15 mL via INTRAVENOUS

## 2016-11-16 ENCOUNTER — Encounter: Payer: Self-pay | Admitting: Physical Therapy

## 2016-11-16 ENCOUNTER — Ambulatory Visit: Payer: Medicare Other | Attending: Neurosurgery | Admitting: Physical Therapy

## 2016-11-16 DIAGNOSIS — M6281 Muscle weakness (generalized): Secondary | ICD-10-CM | POA: Insufficient documentation

## 2016-11-16 DIAGNOSIS — M542 Cervicalgia: Secondary | ICD-10-CM | POA: Diagnosis not present

## 2016-11-16 DIAGNOSIS — R293 Abnormal posture: Secondary | ICD-10-CM | POA: Insufficient documentation

## 2016-11-16 DIAGNOSIS — M2560 Stiffness of unspecified joint, not elsewhere classified: Secondary | ICD-10-CM | POA: Insufficient documentation

## 2016-11-16 NOTE — Therapy (Signed)
[facility] [address] [place], [place], [address] Phone: [phone]   Fax:  [phone]  Physical Therapy Evaluation  Patient Details  Name: Megan Daniels MRN: [id number] Date of Birth: 01/12/1951 Referring Provider: Dr. Arnoldo Morale  Encounter Date: 11/16/2016      PT End of Session - 11/16/16 1454    Visit Number 1   Number of Visits 8   Date for PT Re-Evaluation 12/14/16   Authorization - Visit Number 1   Authorization - Number of Visits 10   PT Start Time 8937   PT Stop Time 3428   PT Time Calculation (min) 63 min   Activity Tolerance Patient tolerated treatment well;No increased pain   Behavior During Therapy WFL for tasks assessed/performed      Past Medical History:  Diagnosis Date  . Abnormal Pap smear of cervix    pos hpv-   . Acid reflux   . Arthritis   . Asthma    Symbicort as needed  . Cancer ([place])    skin  . Chalazion of left eye   . Chronic back pain    spondylolisthesis/stenosis  . Chronic UTI    was treated for 3 months with Macrobid and in Dec everything was fine  . Constipation   . COPD (chronic obstructive pulmonary disease) ([place])   . Diverticulosis   . Elevated serum creatinine    with nsaids  . Foot drop    right  . Genital warts    h/o  . GERD (gastroesophageal reflux disease)    takes Prevacid daily  . Headache   . History of bronchitis   . History of colon polyps   . History of hiatal hernia   . Hypertension    takes Losartan daily  . Joint pain   . Palpitations    takes Metoprolol daily  . Pneumonia    hx of-early 2000's  . PONV (postoperative nausea and vomiting)    extreme nausea and vomiting -none last surgery 10/29/14  . Sarcoidosis ([place])   . Seasonal allergies    takes Claritin daily  . Shortness of breath dyspnea   . Sleep apnea    cpap 10 yrs  . Stress incontinence   . URI (upper respiratory infection)    treated self with Mucinex and no fever  . Urinary  urgency   . Vitamin D deficiency   . Weakness    numbness and tingling in both legs    Past Surgical History:  Procedure Laterality Date  . ABDOMINAL HYSTERECTOMY    . ANTERIOR CERVICAL DECOMP/DISCECTOMY FUSION N/A 10/06/2016   Procedure: ANTERIOR CERVICAL DECOMPRESSION/DISCECTOMY FUSION , INTERBODY PROSTHESIS, PLATE CERVICAL FOUR CERVICAL FIVE, CERVICAL SIX- CERVICAL SEVEN;  Surgeon: Newman Pies, MD;  Location: [facility];  Service: Neurosurgery;  Laterality: N/A;  anterior  . BACK SURGERY    . BREAST BIOPSY Right 1996   EXCISIONAL - NEG  . BUNIONECTOMY Bilateral   . CARPAL TUNNEL RELEASE Right    x 2  . cataract surgery Bilateral   . CERVICAL FUSION    . COLONOSCOPY    . ESOPHAGOGASTRODUODENOSCOPY    . EXPLORATORY LAPAROTOMY    . left middle finger surgery     pin- arthritic cyst  . LUMBAR LAMINECTOMY/DECOMPRESSION MICRODISCECTOMY Right 05/04/2015   Procedure: RIGHT LUMBAR TWO-THREE/THREE-FOUR LAMINECTOMY/DECOMPRESSION ;  Surgeon: Newman Pies, MD;  Location: [place] [facility];  Service: Neurosurgery;  Laterality: Right;  Right L23 L34 laminectomy and foraminotomy  . [place]  fundoplication    . TONSILLECTOMY      There were no vitals filed for this visit.       Subjective Assessment - 11/16/16 1428    Subjective Pt. s/p ACDF on 10/13/16.  Pt. reports an issue with swelling after surgery and difficulty swallowing/ talking after.  Pt. hoping to return to work on 12/19/16 as [place].     Pertinent History see past PT notes and MD surgical report    Diagnostic tests XRAYS   Patient Stated Goals Hoping to return to work 12/19/16.     Currently in Pain? Yes   Pain Score 4    Pain Location Neck   Pain Orientation Lower;Mid   Pain Relieving Factors Wearing cervical brace at night.              [facility] PT Assessment - 11/16/16 0001      Assessment   Medical Diagnosis Cervical Spondylosis with myelopathy.   S/p ACDF   Referring Provider Dr. Arnoldo Morale   Onset  Date/Surgical Date 10/13/16   Hand Dominance Right   Prior Therapy Yes, for low back       See HEP.   Seated STM to B UT (moderate muscle tightness/ hypomobility noted).  Supine cervical AAROM (all planes)- pain tolerable with gentle static stretches.  Discussed sleeping posture and use of ice.         PT Education - 11/16/16 1453    Education provided Yes   Education Details See HEP,  Scapular retraction with RTB/ Chin tucks/ Cervical AROM (rotn./ lateral flexion)- pain-free.    Person(s) Educated Patient   Methods Explanation;Demonstration;Handout   Comprehension Verbalized understanding;Returned demonstration             PT Long Term Goals - 11/16/16 1551      PT LONG TERM GOAL #1   Title Pt. will decrease NDI to <30% to improve functional mobility/ return to work.     Baseline NDI 52% self-perceived severe disability on 11/16/16   Time 4   Period Weeks   Status New     PT LONG TERM GOAL #2   Title Pt. independent with HEP to increase B UE muscle strength 1/2 muscle grade to improve pain-free mobility.     Baseline Cervical AROM: R rotn. (45 deg.), L rotn. (30 deg.)- pain limited.  R lat. flex. (15 deg.), L lat. flex. (16 deg.).  Flexion (30 deg.), ext. (16 deg.).  B UE AROM WFL with L shoulder abd. limited to 130 deg. with RTC discomfort during eccentric muscle control.  L UE grossly 4+/5 MMT.  R UE grossly 4+/5 MMT except tricep 4/5 MMT.  R grip 23# and L grip 20#   Time 4   Period Weeks   Status New     PT LONG TERM GOAL #3   Title Pt. able to demonstrate proper lifting technique/ B carrying with no c/o neck pain.    Baseline pt. has not attempted lifting at this time.     Time 4   Period Weeks   Status New     PT LONG TERM GOAL #4   Title Pt. able to sleep in bed with no increase c/o pain or cervical brace to improve pain-free mobility.    Baseline pain limited in bed/ sleeping without use of neck brace in sidesleeping position.    Time 4   Period Weeks    Status New     PT LONG TERM GOAL #5  Title Pt. able to return to work with no neck pain or limitations to improve return to PLOF.     Baseline pt. is not working at this time   Time 4   Period Weeks   Status New            Plan - 12-01-2016 1439    Clinical Impression Statement Pt. is a pleasant 66 y/o female s/p ACDF on 10/13/16.  Pt. reports 2-4/10 mid-low neck pain currently at rest. Cervical AROM: R rotn. (45 deg.), L rotn. (30 deg.)- pain limited.  R lat. flex. (15 deg.), L lat. flex. (16 deg.).  Flexion (30 deg.), ext. (16 deg.).  B UE AROM WFL with L shoulder abd. limited to 130 deg. with RTC discomfort during eccentric muscle control.  L UE grossly 4+/5 MMT.  R UE grossly 4+/5 MMT except tricep 4/5 MMT.  R grip 23# and L grip 20#.  L hand "going to sleep" during grasping and keyboarding.  Pt. wears resting night splints on L wrist and neck brace still at night.  Pt. sleeps on L or R sidelying position wearing CPAP at night.  Pt. is currently not working due to neck limitations and hoping to RTW by 12/19/16.  NDI: 52% self-perceived severe disability.  Pt. will benefit from skilled PT services to increase neck AROM/ B UE muscle strength/ upright posture to improve return to work.      Rehab Potential Good   Clinical Impairments Affecting Rehab Potential previous back surgeries.    PT Frequency 2x / week   PT Duration 4 weeks   PT Treatment/Interventions ADLs/Self Care Home Management;Cryotherapy;Moist Heat;Balance training;Therapeutic exercise;Therapeutic activities;Functional mobility training;Stair training;Gait training;Neuromuscular re-education;Manual techniques;Passive range of motion;Scar mobilization;Patient/family education   PT Next Visit Plan Review HEP.  Progress upright posture/ B UE muscle strength.     PT Home Exercise Plan See handouts   Consulted and Agree with Plan of Care Patient      Patient will benefit from skilled therapeutic intervention in order to improve the  following deficits and impairments:  Decreased endurance, Improper body mechanics, Impaired flexibility, Decreased strength, Decreased mobility, Pain, Decreased range of motion, Hypomobility, Postural dysfunction  Visit Diagnosis: Neck pain  Joint stiffness  Muscle weakness (generalized)  Abnormal posture      G-Codes - 01-Dec-2016 1452    Functional Assessment Tool Used (Outpatient Only) clinical judgement/ NDI/ pain/ muscle weakness   Functional Limitation Changing and maintaining body position   Changing and Maintaining Body Position Current Status (I0973) At least 40 percent but less than 60 percent impaired, limited or restricted   Changing and Maintaining Body Position Goal Status (Z3299) At least 1 percent but less than 20 percent impaired, limited or restricted       Problem List Patient Active Problem List   Diagnosis Date Noted  . Cervical spondylosis with radiculopathy 10/06/2016  . Family history of breast cancer in mother 07/02/2015  . History of total vaginal hysterectomy 07/02/2015  . Lumbar stenosis 05/04/2015  . Spondylolisthesis of lumbar region 10/29/2014  . BP (high blood pressure) 03/13/2014  . HLD (hyperlipidemia) 03/13/2014  . Awareness of heartbeats 03/13/2014  . 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 Sarcoidosis ([place]) 03/13/2014  . Airway hyperreactivity 02/15/2013   Pura Spice, PT, DPT # [phone] 12-01-16, 4:08 PM  [facility] [address] [place], [place], [address] Phone: [phone]   Fax:  [phone]  Name: Megan Daniels MRN: [id number] Date of  Birth: [birth year]

## 2016-11-21 ENCOUNTER — Ambulatory Visit: Payer: Medicare Other | Admitting: Physical Therapy

## 2016-11-21 DIAGNOSIS — M2560 Stiffness of unspecified joint, not elsewhere classified: Secondary | ICD-10-CM

## 2016-11-21 DIAGNOSIS — M542 Cervicalgia: Secondary | ICD-10-CM | POA: Diagnosis not present

## 2016-11-21 DIAGNOSIS — M6281 Muscle weakness (generalized): Secondary | ICD-10-CM

## 2016-11-21 DIAGNOSIS — R293 Abnormal posture: Secondary | ICD-10-CM

## 2016-11-21 NOTE — Therapy (Signed)
[place] [facility] [address]. [place], [place], [address] Phone: [phone]   Fax:  [phone]  Physical Therapy Treatment  Patient Details  Name: Megan Daniels MRN: [id number] Date of Birth: 10-Oct-1950 Referring Provider: Dr. Arnoldo Morale  Encounter Date: 11/21/2016      PT End of Session - 11/21/16 1837    Visit Number 2   Number of Visits 8   Date for PT Re-Evaluation 12/14/16   Authorization - Visit Number 2   Authorization - Number of Visits 10   PT Start Time 1344   PT Stop Time 1435   PT Time Calculation (min) 51 min   Activity Tolerance Patient tolerated treatment well;No increased pain   Behavior During Therapy WFL for tasks assessed/performed      Past Medical History:  Diagnosis Date  . Abnormal Pap smear of cervix    pos hpv-   . Acid reflux   . Arthritis   . Asthma    Symbicort as needed  . Cancer ([place])    skin  . Chalazion of left eye   . Chronic back pain    spondylolisthesis/stenosis  . Chronic UTI    was treated for 3 months with Macrobid and in Dec everything was fine  . Constipation   . COPD (chronic obstructive pulmonary disease) ([place])   . Diverticulosis   . Elevated serum creatinine    with nsaids  . Foot drop    right  . Genital warts    h/o  . GERD (gastroesophageal reflux disease)    takes Prevacid daily  . Headache   . History of bronchitis   . History of colon polyps   . History of hiatal hernia   . Hypertension    takes Losartan daily  . Joint pain   . Palpitations    takes Metoprolol daily  . Pneumonia    hx of-early 2000's  . PONV (postoperative nausea and vomiting)    extreme nausea and vomiting -none last surgery 10/29/14  . Sarcoidosis ([place])   . Seasonal allergies    takes Claritin daily  . Shortness of breath dyspnea   . Sleep apnea    cpap 10 yrs  . Stress incontinence   . URI (upper respiratory infection)    treated self with Mucinex and no fever  . Urinary urgency    . Vitamin D deficiency   . Weakness    numbness and tingling in both legs    Past Surgical History:  Procedure Laterality Date  . ABDOMINAL HYSTERECTOMY    . ANTERIOR CERVICAL DECOMP/DISCECTOMY FUSION N/A 10/06/2016   Procedure: ANTERIOR CERVICAL DECOMPRESSION/DISCECTOMY FUSION , INTERBODY PROSTHESIS, PLATE CERVICAL FOUR CERVICAL FIVE, CERVICAL SIX- CERVICAL SEVEN;  Surgeon: Newman Pies, MD;  Location: [facility];  Service: Neurosurgery;  Laterality: N/A;  anterior  . BACK SURGERY    . BREAST BIOPSY Right 1996   EXCISIONAL - NEG  . BUNIONECTOMY Bilateral   . CARPAL TUNNEL RELEASE Right    x 2  . cataract surgery Bilateral   . CERVICAL FUSION    . COLONOSCOPY    . ESOPHAGOGASTRODUODENOSCOPY    . EXPLORATORY LAPAROTOMY    . left middle finger surgery     pin- arthritic cyst  . LUMBAR LAMINECTOMY/DECOMPRESSION MICRODISCECTOMY Right 05/04/2015   Procedure: RIGHT LUMBAR TWO-THREE/THREE-FOUR LAMINECTOMY/DECOMPRESSION ;  Surgeon: Newman Pies, MD;  Location: [place] [facility];  Service: Neurosurgery;  Laterality: Right;  Right L23 L34 laminectomy and foraminotomy  . [place]  fundoplication    . TONSILLECTOMY      There were no vitals filed for this visit.      Subjective Assessment - 11/21/16 1835    Subjective Patient reports increased cervical pain due to weekend activities: movie theater, watching grandson's basketball game, and church services.    Pertinent History see past PT notes and MD surgical report    Diagnostic tests XRAYS   Patient Stated Goals Hoping to return to work 12/19/16.     Currently in Pain? Yes   Pain Score 4    Pain Location Neck   Pain Orientation Mid;Lower     Nustep L5 min. B UE/LE.  Manual STM bilateral upper trap, levator scapulae, neck musculature and upper rhomboids AAROM neck SB, flex, rot, ext 4x with 20 second holds at end range of comfort.  TherEx Discussed HEP. 2lb bicep curls with focus on upper body posture and body mechanics x10 Standing  rows (straight arm 1x10 RTB, bent arm 1x10 RTB) with cues for posture. Tricep extensions in standing yellow theraband 1x10 . Wrist pain ER in sitting with yellow theraband. Cues for posture 1x10. Single arm rows with 1lb with alt arm at 90 90 position to emphasize good posture.  Standing posture against doorway 5x8 second holds Seated postural holds (scapular retraction) 10x5 second holds    Pt. Response to medical necessity: Pt. Will benefit from increased cervical ROM, strength, and posture  for functional activities such as driving, returning to work, and household activities.       PT Long Term Goals - 11/16/16 1551      PT LONG TERM GOAL #1   Title Pt. will decrease NDI to <30% to improve functional mobility/ return to work.     Baseline NDI 52% self-perceived severe disability on 11/16/16   Time 4   Period Weeks   Status New     PT LONG TERM GOAL #2   Title Pt. independent with HEP to increase B UE muscle strength 1/2 muscle grade to improve pain-free mobility.     Baseline Cervical AROM: R rotn. (45 deg.), L rotn. (30 deg.)- pain limited.  R lat. flex. (15 deg.), L lat. flex. (16 deg.).  Flexion (30 deg.), ext. (16 deg.).  B UE AROM WFL with L shoulder abd. limited to 130 deg. with RTC discomfort during eccentric muscle control.  L UE grossly 4+/5 MMT.  R UE grossly 4+/5 MMT except tricep 4/5 MMT.  R grip 23# and L grip 20#   Time 4   Period Weeks   Status New     PT LONG TERM GOAL #3   Title Pt. able to demonstrate proper lifting technique/ B carrying with no c/o neck pain.    Baseline pt. has not attempted lifting at this time.     Time 4   Period Weeks   Status New     PT LONG TERM GOAL #4   Title Pt. able to sleep in bed with no increase c/o pain or cervical brace to improve pain-free mobility.    Baseline pain limited in bed/ sleeping without use of neck brace in sidesleeping position.    Time 4   Period Weeks   Status New     PT LONG TERM GOAL #5   Title Pt.  able to return to work with no neck pain or limitations to improve return to PLOF.     Baseline pt. is not working at this time   Time 4  Period Weeks   Status New            Plan - 11/21/16 1842    Clinical Impression Statement Patient presents with residual pain from weekend activities. Postural exercises were implemented with frequent tactile cueing required for proper body mechanics. Pt. was challenged by standing scapular retractions against doorway due to thoracic curvature of back.  Soft tissue musculature of mid and lower cervical region tight with contributing upper trapezius, rhomboids, and levator scapulae muscular tightness. STM provided some decrease of symptoms and increased static posture. Cervical ROM continues to be limited and painful with extension being the position for relief. Patient will benefit from skilled physical therapy to increase neck AROM, strength, and posture to return to work.    Rehab Potential Good   Clinical Impairments Affecting Rehab Potential previous back surgeries.    PT Frequency 2x / week   PT Duration 4 weeks   PT Treatment/Interventions ADLs/Self Care Home Management;Cryotherapy;Moist Heat;Balance training;Therapeutic exercise;Therapeutic activities;Functional mobility training;Stair training;Gait training;Neuromuscular re-education;Manual techniques;Passive range of motion;Scar mobilization;Patient/family education   PT Next Visit Plan  Progress upright posture/ B UE muscle strength.     PT Home Exercise Plan See handouts   Consulted and Agree with Plan of Care Patient      Patient will benefit from skilled therapeutic intervention in order to improve the following deficits and impairments:  Decreased endurance, Improper body mechanics, Impaired flexibility, Decreased strength, Decreased mobility, Pain, Decreased range of motion, Hypomobility, Postural dysfunction  Visit Diagnosis: Neck pain  Joint stiffness  Muscle weakness  (generalized)  Abnormal posture     Problem List Patient Active Problem List   Diagnosis Date Noted  . Cervical spondylosis with radiculopathy 10/06/2016  . Family history of breast cancer in mother 07/02/2015  . History of total vaginal hysterectomy 07/02/2015  . Lumbar stenosis 05/04/2015  . Spondylolisthesis of lumbar region 10/29/2014  . BP (high blood pressure) 03/13/2014  . HLD (hyperlipidemia) 03/13/2014  . Awareness of heartbeats 03/13/2014  . Besnier-Boeck disease ([place]) 03/13/2014  . Sarcoidosis ([place]) 03/13/2014  . Airway hyperreactivity 02/15/2013   Pura Spice, PT, DPT # [id number] Janna Arch, SPT 11/22/2016, 7:35 AM  [place] [facility] [address] [place], [place], [address] Phone: [phone]   Fax:  [phone]  Name: Megan Daniels MRN: [id number] Date of Birth: 10-31-1950

## 2016-11-23 ENCOUNTER — Ambulatory Visit: Payer: Medicare Other | Admitting: Physical Therapy

## 2016-11-23 DIAGNOSIS — M542 Cervicalgia: Secondary | ICD-10-CM | POA: Diagnosis not present

## 2016-11-23 DIAGNOSIS — M6281 Muscle weakness (generalized): Secondary | ICD-10-CM

## 2016-11-23 DIAGNOSIS — M2560 Stiffness of unspecified joint, not elsewhere classified: Secondary | ICD-10-CM

## 2016-11-23 DIAGNOSIS — R293 Abnormal posture: Secondary | ICD-10-CM

## 2016-11-23 NOTE — Therapy (Signed)
[place] [facility] [address] [place], [place], [address] Phone: [phone]   Fax:  [phone]  Physical Therapy Treatment  Patient Details  Name: Megan Daniels MRN: [id number] Date of Birth: 1950/09/26 Referring Provider: Dr. Arnoldo Morale  Encounter Date: 11/23/2016      PT End of Session - 11/23/16 1739    Visit Number 3   Number of Visits 8   Date for PT Re-Evaluation 12/14/16   Authorization - Visit Number 3   Authorization - Number of Visits 10   PT Start Time 2841   PT Stop Time 1520   PT Time Calculation (min) 53 min   Activity Tolerance Patient tolerated treatment well;Patient limited by pain   Behavior During Therapy [facility] for tasks assessed/performed      Past Medical History:  Diagnosis Date  . Abnormal Pap smear of cervix    pos hpv-   . Acid reflux   . Arthritis   . Asthma    Symbicort as needed  . Cancer ([place])    skin  . Chalazion of left eye   . Chronic back pain    spondylolisthesis/stenosis  . Chronic UTI    was treated for 3 months with Macrobid and in Dec everything was fine  . Constipation   . COPD (chronic obstructive pulmonary disease) ([place])   . Diverticulosis   . Elevated serum creatinine    with nsaids  . Foot drop    right  . Genital warts    h/o  . GERD (gastroesophageal reflux disease)    takes Prevacid daily  . Headache   . History of bronchitis   . History of colon polyps   . History of hiatal hernia   . Hypertension    takes Losartan daily  . Joint pain   . Palpitations    takes Metoprolol daily  . Pneumonia    hx of-early 2000's  . PONV (postoperative nausea and vomiting)    extreme nausea and vomiting -none last surgery 10/29/14  . Sarcoidosis ([place])   . Seasonal allergies    takes Claritin daily  . Shortness of breath dyspnea   . Sleep apnea    cpap 10 yrs  . Stress incontinence   . URI (upper respiratory infection)    treated self with Mucinex and no fever  . Urinary  urgency   . Vitamin D deficiency   . Weakness    numbness and tingling in both legs    Past Surgical History:  Procedure Laterality Date  . ABDOMINAL HYSTERECTOMY    . ANTERIOR CERVICAL DECOMP/DISCECTOMY FUSION N/A 10/06/2016   Procedure: ANTERIOR CERVICAL DECOMPRESSION/DISCECTOMY FUSION , INTERBODY PROSTHESIS, PLATE CERVICAL FOUR CERVICAL FIVE, CERVICAL SIX- CERVICAL SEVEN;  Surgeon: Newman Pies, MD;  Location: [facility];  Service: Neurosurgery;  Laterality: N/A;  anterior  . BACK SURGERY    . BREAST BIOPSY Right 1996   EXCISIONAL - NEG  . BUNIONECTOMY Bilateral   . CARPAL TUNNEL RELEASE Right    x 2  . cataract surgery Bilateral   . CERVICAL FUSION    . COLONOSCOPY    . ESOPHAGOGASTRODUODENOSCOPY    . EXPLORATORY LAPAROTOMY    . left middle finger surgery     pin- arthritic cyst  . LUMBAR LAMINECTOMY/DECOMPRESSION MICRODISCECTOMY Right 05/04/2015   Procedure: RIGHT LUMBAR TWO-THREE/THREE-FOUR LAMINECTOMY/DECOMPRESSION ;  Surgeon: Newman Pies, MD;  Location: [place] [facility];  Service: Neurosurgery;  Laterality: Right;  Right L23 L34 laminectomy and foraminotomy  .  [place]issan fundoplication    . TONSILLECTOMY      There were no vitals filed for this visit.      Subjective Assessment - 11/23/16 1430    Subjective Patient has been having midline cervical pain that is more stinging in nature but has been working all day. She was able to sleep without collar last night.    Pertinent History see past PT notes and MD surgical report    Diagnostic tests XRAYS   Patient Stated Goals Hoping to return to work 12/19/16.     Currently in Pain? Yes   Pain Score 6    Pain Location Neck   Pain Orientation Mid   Pain Descriptors / Indicators Burning     Manual STM bilateral upper trap, levator scapulae, neck musculature and upper rhomboids during AAROM and in seated.  Supine AAROM neck SB, flex, rot, ext 4x with 20 second holds at end range of comfort.  TherEx  Nustep L5 min. B UE/LE.  (no charge Supine bench press with 2lb and dowel 1x12 each type.  3lb bicep curls with focus on upper body posture and body mechanics x12 single arm, x15 double arm Standing rows (straight arm 1x12 RTB, bent arm 1x12 RTB) with cues for posture. ER in sitting with red theraband. Cues for posture 1x12 Standing posture against doorway 1x 30 seconds. Slightly painful Chin tucks: painful so discontinued.     Pt. Response to medical necessity: Pt. Will benefit from increased cervical ROM, strength, and posture  for functional activities such as driving, returning to work, and household activities.        PT Long Term Goals - 11/16/16 1551      PT LONG TERM GOAL #1   Title Pt. will decrease NDI to <30% to improve functional mobility/ return to work.     Baseline NDI 52% self-perceived severe disability on 11/16/16   Time 4   Period Weeks   Status New     PT LONG TERM GOAL #2   Title Pt. independent with HEP to increase B UE muscle strength 1/2 muscle grade to improve pain-free mobility.     Baseline Cervical AROM: R rotn. (45 deg.), L rotn. (30 deg.)- pain limited.  R lat. flex. (15 deg.), L lat. flex. (16 deg.).  Flexion (30 deg.), ext. (16 deg.).  B UE AROM WFL with L shoulder abd. limited to 130 deg. with RTC discomfort during eccentric muscle control.  L UE grossly 4+/5 MMT.  R UE grossly 4+/5 MMT except tricep 4/5 MMT.  R grip 23# and L grip 20#   Time 4   Period Weeks   Status New     PT LONG TERM GOAL #3   Title Pt. able to demonstrate proper lifting technique/ B carrying with no c/o neck pain.    Baseline pt. has not attempted lifting at this time.     Time 4   Period Weeks   Status New     PT LONG TERM GOAL #4   Title Pt. able to sleep in bed with no increase c/o pain or cervical brace to improve pain-free mobility.    Baseline pain limited in bed/ sleeping without use of neck brace in sidesleeping position.    Time 4   Period Weeks   Status New     PT LONG TERM GOAL #5    Title Pt. able to return to work with no neck pain or limitations to improve return to PLOF.  Baseline pt. is not working at this time   Time [place] - 11/23/16 1742    Clinical Impression Statement Patient is limited in cervical mobility by pain. Occasional spasms are felt in cervical musculature limiting pt. in her range of motion due to pain and fear of pain. Scapular retractions and rows were performed with better body mechanics and postural control. Chin tucks were painful and discontinued. STM to cervical and trapezius musculature was provided with active range of motion in supine. Cervical and thoracic musculature is tight and patient is guarded against mobility of cervical spine. Patient will benefit from continued skilled physical therapy to increase neck AROM, strength, and posture to return to work.    Rehab Potential Good   Clinical Impairments Affecting Rehab Potential previous back surgeries.    PT Frequency 2x / week   PT Duration 4 weeks   PT Treatment/Interventions ADLs/Self Care Home Management;Cryotherapy;Moist Heat;Balance training;Therapeutic exercise;Therapeutic activities;Functional mobility training;Stair training;Gait training;Neuromuscular re-education;Manual techniques;Passive range of motion;Scar mobilization;Patient/family education   PT Next Visit Plan  Progress upright posture/ B UE muscle strength.     PT Home Exercise Plan See handouts   Consulted and Agree with Plan of Care Patient      Patient will benefit from skilled therapeutic intervention in order to improve the following deficits and impairments:  Decreased endurance, Improper body mechanics, Impaired flexibility, Decreased strength, Decreased mobility, Pain, Decreased range of motion, Hypomobility, Postural dysfunction  Visit Diagnosis: Neck pain  Joint stiffness  Muscle weakness (generalized)  Abnormal posture     Problem List Patient  Active Problem List   Diagnosis Date Noted  . Cervical spondylosis with radiculopathy 10/06/2016  . Family history of breast cancer in mother 07/02/2015  . History of total vaginal hysterectomy 07/02/2015  . Lumbar stenosis 05/04/2015  . Spondylolisthesis of lumbar region 10/29/2014  . BP (high blood pressure) 03/13/2014  . HLD (hyperlipidemia) 03/13/2014  . Awareness of heartbeats 03/13/2014  . Besnier-Boeck disease ([place]) 03/13/2014  . Sarcoidosis ([place]) 03/13/2014  . Airway hyperreactivity 02/15/2013   Pura Spice, PT, DPT # [id number] Janna Arch, SPT 11/24/2016, 2:27 PM  [facility] [address] [place], [place], [address] Phone: [phone]   Fax:  [phone]  Name: Megan Daniels MRN: [id number] Date of Birth: Jan 11, 1951

## 2016-11-28 ENCOUNTER — Ambulatory Visit: Payer: Medicare Other | Admitting: Physical Therapy

## 2016-11-28 DIAGNOSIS — R293 Abnormal posture: Secondary | ICD-10-CM

## 2016-11-28 DIAGNOSIS — M542 Cervicalgia: Secondary | ICD-10-CM | POA: Diagnosis not present

## 2016-11-28 DIAGNOSIS — M2560 Stiffness of unspecified joint, not elsewhere classified: Secondary | ICD-10-CM

## 2016-11-28 DIAGNOSIS — M6281 Muscle weakness (generalized): Secondary | ICD-10-CM

## 2016-11-28 NOTE — Therapy (Signed)
[facility] [address] [place], [place], [address] Phone: [phone]   Fax:  [phone]  Physical Therapy Treatment  Patient Details  Name: Megan Daniels MRN: [id number] Date of Birth: 1951-08-11 Referring Provider: Dr. Arnoldo Morale  Encounter Date: 11/28/2016      PT End of Session - 11/28/16 1342    Visit Number 4   Number of Visits 8   Date for PT Re-Evaluation 12/14/16   Authorization - Visit Number 4   Authorization - Number of Visits 10   PT Start Time 1027   PT Stop Time 1116   PT Time Calculation (min) 49 min   Activity Tolerance Patient tolerated treatment well;Patient limited by pain   Behavior During Therapy [facility] for tasks assessed/performed      Past Medical History:  Diagnosis Date  . Abnormal Pap smear of cervix    pos hpv-   . Acid reflux   . Arthritis   . Asthma    Symbicort as needed  . Cancer ([place])    skin  . Chalazion of left eye   . Chronic back pain    spondylolisthesis/stenosis  . Chronic UTI    was treated for 3 months with Macrobid and in Dec everything was fine  . Constipation   . COPD (chronic obstructive pulmonary disease) ([place])   . Diverticulosis   . Elevated serum creatinine    with nsaids  . Foot drop    right  . Genital warts    h/o  . GERD (gastroesophageal reflux disease)    takes Prevacid daily  . Headache   . History of bronchitis   . History of colon polyps   . History of hiatal hernia   . Hypertension    takes Losartan daily  . Joint pain   . Palpitations    takes Metoprolol daily  . Pneumonia    hx of-early 2000's  . PONV (postoperative nausea and vomiting)    extreme nausea and vomiting -none last surgery 10/29/14  . Sarcoidosis ([place])   . Seasonal allergies    takes Claritin daily  . Shortness of breath dyspnea   . Sleep apnea    cpap 10 yrs  . Stress incontinence   . URI (upper respiratory infection)    treated self with Mucinex and no fever  . Urinary  urgency   . Vitamin D deficiency   . Weakness    numbness and tingling in both legs    Past Surgical History:  Procedure Laterality Date  . ABDOMINAL HYSTERECTOMY    . ANTERIOR CERVICAL DECOMP/DISCECTOMY FUSION N/A 10/06/2016   Procedure: ANTERIOR CERVICAL DECOMPRESSION/DISCECTOMY FUSION , INTERBODY PROSTHESIS, PLATE CERVICAL FOUR CERVICAL FIVE, CERVICAL SIX- CERVICAL SEVEN;  Surgeon: Newman Pies, MD;  Location: [place] [facility];  Service: Neurosurgery;  Laterality: N/A;  anterior  . BACK SURGERY    . BREAST BIOPSY Right 1996   EXCISIONAL - NEG  . BUNIONECTOMY Bilateral   . CARPAL TUNNEL RELEASE Right    x 2  . cataract surgery Bilateral   . CERVICAL FUSION    . COLONOSCOPY    . ESOPHAGOGASTRODUODENOSCOPY    . EXPLORATORY LAPAROTOMY    . left middle finger surgery     pin- arthritic cyst  . LUMBAR LAMINECTOMY/DECOMPRESSION MICRODISCECTOMY Right 05/04/2015   Procedure: RIGHT LUMBAR TWO-THREE/THREE-FOUR LAMINECTOMY/DECOMPRESSION ;  Surgeon: Newman Pies, MD;  Location: [facility];  Service: Neurosurgery;  Laterality: Right;  Right L23 L34 laminectomy and foraminotomy  .  Nissan fundoplication    . TONSILLECTOMY      There were no vitals filed for this visit.      Subjective Assessment - 11/28/16 1035    Subjective Pt. reports having increased pain in posterior midline portion of neck with 7-9/10 pain this weekend. Pain relieved by pressing on it with hand and when head is supported/in recliner. Not been doing neck stretches this weekend due to pain, been using 3 tylenol a night just to sleep.   Pertinent History see past PT notes and MD surgical report    Diagnostic tests XRAYS   Patient Stated Goals Hoping to return to work 12/19/16.     Currently in Pain? Yes   Pain Score 1    Pain Location Neck   Pain Orientation Mid     Manual STM bilateral upper trap, levator scapulae, neck musculature and upper rhomboids in seated position Seated AAROM neck SB, flex, rot, ext 6xwith 30  second holds at end range of comfort. Deep pressure applied to decrease pt. Guarding. Spasms in extension  TherEx Nustep Lvl 5 10 minutes (no charge) Standing rows with red theraband, 2x15 Standing straight arm lat/row 2x15 red theraband 1lb arm abduction with cue for upright neutral cervical posture 15x each time   Pt. Response to medical necessity: Pt. Will benefit from increased cervical ROM, strength, and posture for functional activities such as driving, returning to work, and household activities.         PT Long Term Goals - 11/16/16 1551      PT LONG TERM GOAL #1   Title Pt. will decrease NDI to <30% to improve functional mobility/ return to work.     Baseline NDI 52% self-perceived severe disability on 11/16/16   Time 4   Period Weeks   Status New     PT LONG TERM GOAL #2   Title Pt. independent with HEP to increase B UE muscle strength 1/2 muscle grade to improve pain-free mobility.     Baseline Cervical AROM: R rotn. (45 deg.), L rotn. (30 deg.)- pain limited.  R lat. flex. (15 deg.), L lat. flex. (16 deg.).  Flexion (30 deg.), ext. (16 deg.).  B UE AROM WFL with L shoulder abd. limited to 130 deg. with RTC discomfort during eccentric muscle control.  L UE grossly 4+/5 MMT.  R UE grossly 4+/5 MMT except tricep 4/5 MMT.  R grip 23# and L grip 20#   Time 4   Period Weeks   Status New     PT LONG TERM GOAL #3   Title Pt. able to demonstrate proper lifting technique/ B carrying with no c/o neck pain.    Baseline pt. has not attempted lifting at this time.     Time 4   Period Weeks   Status New     PT LONG TERM GOAL #4   Title Pt. able to sleep in bed with no increase c/o pain or cervical brace to improve pain-free mobility.    Baseline pain limited in bed/ sleeping without use of neck brace in sidesleeping position.    Time 4   Period Weeks   Status New     PT LONG TERM GOAL #5   Title Pt. able to return to work with no neck pain or limitations to improve return to  PLOF.     Baseline pt. is not working at this time   Time 4   Period Weeks   Status New  Plan - 11/28/16 1348    Clinical Impression Statement Patient is fearful of cervical mobility due to pain. Pt. has been having midline pain that was not aggravated with exercise but would occur after exercise and required pt. to press hand against spot to relieve it.  Was unable to reproduce pain in session today but is indicative of musculature related pain. Pt. is guarded against mobility and requires deep pressure for AAROM in all positions for improved range. Tight cervical and upper trap musculature was relieved with STM. Patient will continue to benefit from skilled physical therapy to increase neck AROM, strength, and posture to return to work.    Rehab Potential Good   Clinical Impairments Affecting Rehab Potential previous back surgeries.    PT Frequency 2x / week   PT Duration 4 weeks   PT Treatment/Interventions ADLs/Self Care Home Management;Cryotherapy;Moist Heat;Balance training;Therapeutic exercise;Therapeutic activities;Functional mobility training;Stair training;Gait training;Neuromuscular re-education;Manual techniques;Passive range of motion;Scar mobilization;Patient/family education   PT Next Visit Plan isometrics   PT Home Exercise Plan See handouts   Consulted and Agree with Plan of Care Patient      Patient will benefit from skilled therapeutic intervention in order to improve the following deficits and impairments:  Decreased endurance, Improper body mechanics, Impaired flexibility, Decreased strength, Decreased mobility, Pain, Decreased range of motion, Hypomobility, Postural dysfunction  Visit Diagnosis: Neck pain  Joint stiffness  Muscle weakness (generalized)  Abnormal posture     Problem List Patient Active Problem List   Diagnosis Date Noted  . Cervical spondylosis with radiculopathy 10/06/2016  . Family history of breast cancer in mother  07/02/2015  . History of total vaginal hysterectomy 07/02/2015  . Lumbar stenosis 05/04/2015  . Spondylolisthesis of lumbar region 10/29/2014  . BP (high blood pressure) 03/13/2014  . HLD (hyperlipidemia) 03/13/2014  . Awareness of heartbeats 03/13/2014  . Besnier-Boeck disease ([place]) 03/13/2014  . Sarcoidosis ([place]) 03/13/2014  . Airway hyperreactivity 02/15/2013   Pura Spice, PT, DPT # [id number] Janna Arch, SPT 11/28/2016, 2:19 PM  [facility] [address] [place], [place], [address] Phone: [phone]   Fax:  [phone]  Name: Megan Daniels MRN: [id number] Date of Birth: [birth year]

## 2016-11-30 ENCOUNTER — Ambulatory Visit: Payer: Medicare Other | Admitting: Physical Therapy

## 2016-11-30 DIAGNOSIS — M2560 Stiffness of unspecified joint, not elsewhere classified: Secondary | ICD-10-CM

## 2016-11-30 DIAGNOSIS — M542 Cervicalgia: Secondary | ICD-10-CM

## 2016-11-30 DIAGNOSIS — M6281 Muscle weakness (generalized): Secondary | ICD-10-CM

## 2016-11-30 DIAGNOSIS — R293 Abnormal posture: Secondary | ICD-10-CM

## 2016-11-30 NOTE — Therapy (Signed)
[facility] [address] [place], [place], [address] Phone: [phone]   Fax:  [phone]  Physical Therapy Treatment  Patient Details  Name: Megan Daniels MRN: [id number] Date of Birth: December 23, 1950 Referring Provider: Dr. Arnoldo Morale  Encounter Date: 11/30/2016      PT End of Session - 11/30/16 1756    Visit Number 5   Number of Visits 8   Date for PT Re-Evaluation 12/14/16   Authorization - Visit Number 5   Authorization - Number of Visits 10   PT Start Time 3785   PT Stop Time 1116   PT Time Calculation (min) 44 min   Activity Tolerance Patient tolerated treatment well;Patient limited by pain   Behavior During Therapy [facility] for tasks assessed/performed      Past Medical History:  Diagnosis Date  . Abnormal Pap smear of cervix    pos hpv-   . Acid reflux   . Arthritis   . Asthma    Symbicort as needed  . Cancer ([place])    skin  . Chalazion of left eye   . Chronic back pain    spondylolisthesis/stenosis  . Chronic UTI    was treated for 3 months with Macrobid and in Dec everything was fine  . Constipation   . COPD (chronic obstructive pulmonary disease) ([place])   . Diverticulosis   . Elevated serum creatinine    with nsaids  . Foot drop    right  . Genital warts    h/o  . GERD (gastroesophageal reflux disease)    takes Prevacid daily  . Headache   . History of bronchitis   . History of colon polyps   . History of hiatal hernia   . Hypertension    takes Losartan daily  . Joint pain   . Palpitations    takes Metoprolol daily  . Pneumonia    hx of-early 2000's  . PONV (postoperative nausea and vomiting)    extreme nausea and vomiting -none last surgery 10/29/14  . Sarcoidosis ([place])   . Seasonal allergies    takes Claritin daily  . Shortness of breath dyspnea   . Sleep apnea    cpap 10 yrs  . Stress incontinence   . URI (upper respiratory infection)    treated self with Mucinex and no fever  . Urinary  urgency   . Vitamin D deficiency   . Weakness    numbness and tingling in both legs    Past Surgical History:  Procedure Laterality Date  . ABDOMINAL HYSTERECTOMY    . ANTERIOR CERVICAL DECOMP/DISCECTOMY FUSION N/A 10/06/2016   Procedure: ANTERIOR CERVICAL DECOMPRESSION/DISCECTOMY FUSION , INTERBODY PROSTHESIS, PLATE CERVICAL FOUR CERVICAL FIVE, CERVICAL SIX- CERVICAL SEVEN;  Surgeon: Newman Pies, MD;  Location: [facility];  Service: Neurosurgery;  Laterality: N/A;  anterior  . BACK SURGERY    . BREAST BIOPSY Right 1996   EXCISIONAL - NEG  . BUNIONECTOMY Bilateral   . CARPAL TUNNEL RELEASE Right    x 2  . cataract surgery Bilateral   . CERVICAL FUSION    . COLONOSCOPY    . ESOPHAGOGASTRODUODENOSCOPY    . EXPLORATORY LAPAROTOMY    . left middle finger surgery     pin- arthritic cyst  . LUMBAR LAMINECTOMY/DECOMPRESSION MICRODISCECTOMY Right 05/04/2015   Procedure: RIGHT LUMBAR TWO-THREE/THREE-FOUR LAMINECTOMY/DECOMPRESSION ;  Surgeon: Newman Pies, MD;  Location: [facility];  Service: Neurosurgery;  Laterality: Right;  Right L23 L34 laminectomy and foraminotomy  .  Nissan fundoplication    . TONSILLECTOMY      There were no vitals filed for this visit.      Subjective Assessment - 11/30/16 1754    Subjective Patient is having decreased midcervical pain and now is noting soreness of cervical musculature.    Pertinent History see past PT notes and MD surgical report    Diagnostic tests XRAYS   Patient Stated Goals Hoping to return to work 12/19/16.     Currently in Pain? Yes   Pain Score 2    Pain Location Neck   Pain Orientation Lateral      Manual STM bilateral upper trap, levator scapulae, neck musculature and upper rhomboids in seated position with trunk flexed and head resting on table to promote cervical relaxation.  Supine AAROM neck SB, flex, rot, ext 10xwith 40 second holds at end range of comfort. Deep pressure applied and increased range noted with  continuation.  TherEx Isometrics in supine: educated on utilizing hand and 10% strength. Frequent muscle spasms elicited Modified chin tucks in supine2x10. Mild cervical spasms  Pt. Response to medical necessity: Pt. Will benefit from increased cervical ROM, strength, and posture for functional activities such as driving, returning to work, and household activities   Pt. Response to medical necessity: Pt. Will benefit from increased cervical ROM, strength, and posture for functional activities such as driving, returning to work, and household activities        PT Education - 11/30/16 1756    Education provided Yes   Education Details introduction into isometrics and chin tucks   Person(s) Educated Patient   Methods Explanation;Demonstration   Comprehension Verbalized understanding;Returned demonstration             PT Long Term Goals - 11/16/16 1551      PT LONG TERM GOAL #1   Title Pt. will decrease NDI to <30% to improve functional mobility/ return to work.     Baseline NDI 52% self-perceived severe disability on 11/16/16   Time 4   Period Weeks   Status New     PT LONG TERM GOAL #2   Title Pt. independent with HEP to increase B UE muscle strength 1/2 muscle grade to improve pain-free mobility.     Baseline Cervical AROM: R rotn. (45 deg.), L rotn. (30 deg.)- pain limited.  R lat. flex. (15 deg.), L lat. flex. (16 deg.).  Flexion (30 deg.), ext. (16 deg.).  B UE AROM WFL with L shoulder abd. limited to 130 deg. with RTC discomfort during eccentric muscle control.  L UE grossly 4+/5 MMT.  R UE grossly 4+/5 MMT except tricep 4/5 MMT.  R grip 23# and L grip 20#   Time 4   Period Weeks   Status New     PT LONG TERM GOAL #3   Title Pt. able to demonstrate proper lifting technique/ B carrying with no c/o neck pain.    Baseline pt. has not attempted lifting at this time.     Time 4   Period Weeks   Status New     PT LONG TERM GOAL #4   Title Pt. able to sleep in bed  with no increase c/o pain or cervical brace to improve pain-free mobility.    Baseline pain limited in bed/ sleeping without use of neck brace in sidesleeping position.    Time 4   Period Weeks   Status New     PT LONG TERM GOAL #5   Title Pt.  able to return to work with no neck pain or limitations to improve return to PLOF.     Baseline pt. is not working at this time   Time 4   Period Weeks   Status New            Plan - 11/30/16 1800    Clinical Impression Statement Patient presents to session with improved cervical AAROM and decreased midcervical pain. Frequent episodes of cervical musculature spasms occurred throughout session due to weak cervical mm. Isometrics were challenging to pt. and elicited strong muscle spasms. Modified chin tucks in supine were implemented with good results. Pt. continues to be fearful of movement due to pain avoidance mindset but is improving. Tight cervical and upper trap muscles were relieved with STM. Patient will continue to benefit from skilled physical therapy to increase neck AROM, strength, and posture to return to work.    Rehab Potential Good   Clinical Impairments Affecting Rehab Potential previous back surgeries.    PT Frequency 2x / week   PT Duration 4 weeks   PT Treatment/Interventions ADLs/Self Care Home Management;Cryotherapy;Moist Heat;Balance training;Therapeutic exercise;Therapeutic activities;Functional mobility training;Stair training;Gait training;Neuromuscular re-education;Manual techniques;Passive range of motion;Scar mobilization;Patient/family education   PT Next Visit Plan isometrics   PT Home Exercise Plan See handouts   Consulted and Agree with Plan of Care Patient      Patient will benefit from skilled therapeutic intervention in order to improve the following deficits and impairments:  Decreased endurance, Improper body mechanics, Impaired flexibility, Decreased strength, Decreased mobility, Pain, Decreased range of  motion, Hypomobility, Postural dysfunction  Visit Diagnosis: Neck pain  Joint stiffness  Muscle weakness (generalized)  Abnormal posture     Problem List Patient Active Problem List   Diagnosis Date Noted  . Cervical spondylosis with radiculopathy 10/06/2016  . Family history of breast cancer in mother 07/02/2015  . History of total vaginal hysterectomy 07/02/2015  . Lumbar stenosis 05/04/2015  . Spondylolisthesis of lumbar region 10/29/2014  . BP (high blood pressure) 03/13/2014  . HLD (hyperlipidemia) 03/13/2014  . Awareness of heartbeats 03/13/2014  . Besnier-Boeck disease ([place]) 03/13/2014  . Sarcoidosis ([place]) 03/13/2014  . Airway hyperreactivity 02/15/2013   Pura Spice, PT, DPT # [id number] Janna Arch, SPT 12/01/2016, 7:58 AM  [facility] [address] [place], [place], [address] Phone: [phone]   Fax:  [phone]  Name: Megan Daniels MRN: [id number] Date of Birth: 1951/02/20

## 2016-12-01 ENCOUNTER — Encounter: Payer: Self-pay | Admitting: Physical Therapy

## 2016-12-05 ENCOUNTER — Ambulatory Visit: Payer: Medicare Other | Attending: Neurosurgery | Admitting: Physical Therapy

## 2016-12-05 DIAGNOSIS — M2560 Stiffness of unspecified joint, not elsewhere classified: Secondary | ICD-10-CM | POA: Insufficient documentation

## 2016-12-05 DIAGNOSIS — M6281 Muscle weakness (generalized): Secondary | ICD-10-CM | POA: Insufficient documentation

## 2016-12-05 DIAGNOSIS — G8929 Other chronic pain: Secondary | ICD-10-CM | POA: Insufficient documentation

## 2016-12-05 DIAGNOSIS — R293 Abnormal posture: Secondary | ICD-10-CM | POA: Diagnosis present

## 2016-12-05 DIAGNOSIS — M545 Low back pain: Secondary | ICD-10-CM | POA: Insufficient documentation

## 2016-12-05 DIAGNOSIS — M542 Cervicalgia: Secondary | ICD-10-CM | POA: Insufficient documentation

## 2016-12-05 NOTE — Therapy (Cosign Needed)
[facility] [address] [place], [place], [address] Phone: [phone]   Fax:  [phone]  Physical Therapy Treatment  Patient Details  Name: Megan Daniels MRN: [id number] Date of Birth: 01-20-1951 Referring Provider: Dr. Arnoldo Morale  Encounter Date: 12/05/2016      PT End of Session - 12/05/16 1838    Visit Number 6   Number of Visits 8   Date for PT Re-Evaluation 12/14/16   Authorization - Visit Number 6   Authorization - Number of Visits 10   PT Start Time 9373   PT Stop Time 1344   PT Time Calculation (min) 46 min   Activity Tolerance Patient tolerated treatment well;Patient limited by pain   Behavior During Therapy [facility] for tasks assessed/performed      Past Medical History:  Diagnosis Date  . Abnormal Pap smear of cervix    pos hpv-   . Acid reflux   . Arthritis   . Asthma    Symbicort as needed  . Cancer ([place])    skin  . Chalazion of left eye   . Chronic back pain    spondylolisthesis/stenosis  . Chronic UTI    was treated for 3 months with Macrobid and in Dec everything was fine  . Constipation   . COPD (chronic obstructive pulmonary disease) ([place])   . Diverticulosis   . Elevated serum creatinine    with nsaids  . Foot drop    right  . Genital warts    h/o  . GERD (gastroesophageal reflux disease)    takes Prevacid daily  . Headache   . History of bronchitis   . History of colon polyps   . History of hiatal hernia   . Hypertension    takes Losartan daily  . Joint pain   . Palpitations    takes Metoprolol daily  . Pneumonia    hx of-early 2000's  . PONV (postoperative nausea and vomiting)    extreme nausea and vomiting -none last surgery 10/29/14  . Sarcoidosis ([place])   . Seasonal allergies    takes Claritin daily  . Shortness of breath dyspnea   . Sleep apnea    cpap 10 yrs  . Stress incontinence   . URI (upper respiratory infection)    treated self with Mucinex and no fever  . Urinary  urgency   . Vitamin D deficiency   . Weakness    numbness and tingling in both legs    Past Surgical History:  Procedure Laterality Date  . ABDOMINAL HYSTERECTOMY    . ANTERIOR CERVICAL DECOMP/DISCECTOMY FUSION N/A 10/06/2016   Procedure: ANTERIOR CERVICAL DECOMPRESSION/DISCECTOMY FUSION , INTERBODY PROSTHESIS, PLATE CERVICAL FOUR CERVICAL FIVE, CERVICAL SIX- CERVICAL SEVEN;  Surgeon: Newman Pies, MD;  Location: [place];  Service: Neurosurgery;  Laterality: N/A;  anterior  . BACK SURGERY    . BREAST BIOPSY Right 1996   EXCISIONAL - NEG  . BUNIONECTOMY Bilateral   . CARPAL TUNNEL RELEASE Right    x 2  . cataract surgery Bilateral   . CERVICAL FUSION    . COLONOSCOPY    . ESOPHAGOGASTRODUODENOSCOPY    . EXPLORATORY LAPAROTOMY    . left middle finger surgery     pin- arthritic cyst  . LUMBAR LAMINECTOMY/DECOMPRESSION MICRODISCECTOMY Right 05/04/2015   Procedure: RIGHT LUMBAR TWO-THREE/THREE-FOUR LAMINECTOMY/DECOMPRESSION ;  Surgeon: Newman Pies, MD;  Location: [place] [facility];  Service: Neurosurgery;  Laterality: Right;  Right L23 L34 laminectomy and foraminotomy  .  Nissan fundoplication    . TONSILLECTOMY      There were no vitals filed for this visit.      Subjective Assessment - 12/05/16 1836    Subjective Patient had increased pain at church yesterday due to the long day and Easter preparations. Slept well last night without cervical collar.    Pertinent History see past PT notes and MD surgical report    Diagnostic tests XRAYS   Patient Stated Goals Hoping to return to work 12/19/16.     Currently in Pain? Yes   Pain Score 2    Pain Location Neck      Manual STM bilateral upper trap, levator scapulae, neck musculature and upper rhomboids in seated position. Supine AAROM neck SB, flex, rot, ext 6xwith 60 second holds at end range of comfort. Deep pressure applied and increased range noted with continuation.  TherEx Isometrics in supine: educated on utilizing hand  and 10% strength.  Modified chin tucks in supine1x10.  AROM in seated position with deep pressure applied to cervical musculature and trapezius region.    Pt. Response to medical necessity: Pt. Will benefit from increased cervical ROM, strength, and posture for functional activities such as driving, returning to work, and household activities          PT Long Term Goals - 11/16/16 [place] #1   Title Pt. will decrease NDI to <30% to improve functional mobility/ return to work.     Baseline NDI 52% self-perceived severe disability on 11/16/16   Time 4   Period Weeks   Status New     PT LONG TERM GOAL #2   Title Pt. independent with HEP to increase B UE muscle strength 1/2 muscle grade to improve pain-free mobility.     Baseline Cervical AROM: R rotn. (45 deg.), L rotn. (30 deg.)- pain limited.  R lat. flex. (15 deg.), L lat. flex. (16 deg.).  Flexion (30 deg.), ext. (16 deg.).  B UE AROM WFL with L shoulder abd. limited to 130 deg. with RTC discomfort during eccentric muscle control.  L UE grossly 4+/5 MMT.  R UE grossly 4+/5 MMT except tricep 4/5 MMT.  R grip 23# and L grip 20#   Time 4   Period Weeks   Status New     PT LONG TERM GOAL #3   Title Pt. able to demonstrate proper lifting technique/ B carrying with no c/o neck pain.    Baseline pt. has not attempted lifting at this time.     Time 4   Period Weeks   Status New     PT LONG TERM GOAL #4   Title Pt. able to sleep in bed with no increase c/o pain or cervical brace to improve pain-free mobility.    Baseline pain limited in bed/ sleeping without use of neck brace in sidesleeping position.    Time 4   Period Weeks   Status New     PT LONG TERM GOAL #5   Title Pt. able to return to work with no neck pain or limitations to improve return to PLOF.     Baseline pt. is not working at this time   Time 4   Period Weeks   Status New               Plan - 12/05/16 1841    Clinical Impression  Statement Patient presented with improved AAROM and AROM of cervical spine.  Left side bending and rotation continue to be more limited than right with increased episodes of muscular spasms. Cervical isometrics were performed with decreased spasms than prior sessions and pt. has began to incorporate chin tucks back into HEP. AROM in seated was improved with deep pressure applied to cervical/trapezius musculature to decrease episodes of spasm. Patient continues to be fearful of movement due to pain but is beginning to progress through ROM and strengthening with positive results. Patient will continue to benefit from skilled physical therapy to increase neck ROM, strength, and posture to return to work.    Rehab Potential Good   Clinical Impairments Affecting Rehab Potential previous back surgeries.    PT Frequency 2x / week   PT Duration 4 weeks   PT Treatment/Interventions ADLs/Self Care Home Management;Cryotherapy;Moist Heat;Balance training;Therapeutic exercise;Therapeutic activities;Functional mobility training;Stair training;Gait training;Neuromuscular re-education;Manual techniques;Passive range of motion;Scar mobilization;Patient/family education   PT Next Visit Plan isometrics   PT Home Exercise Plan See handouts   Consulted and Agree with Plan of Care Patient      Patient will benefit from skilled therapeutic intervention in order to improve the following deficits and impairments:  Decreased endurance, Improper body mechanics, Impaired flexibility, Decreased strength, Decreased mobility, Pain, Decreased range of motion, Hypomobility, Postural dysfunction  Visit Diagnosis: Neck pain  Joint stiffness  Muscle weakness (generalized)  Abnormal posture     Problem List Patient Active Problem List   Diagnosis Date Noted  . Cervical spondylosis with radiculopathy 10/06/2016  . Family history of breast cancer in mother 07/02/2015  . History of total vaginal hysterectomy 07/02/2015  .  Lumbar stenosis 05/04/2015  . Spondylolisthesis of lumbar region 10/29/2014  . BP (high blood pressure) 03/13/2014  . HLD (hyperlipidemia) 03/13/2014  . Awareness of heartbeats 03/13/2014  . Besnier-Boeck disease ([place]) 03/13/2014  . Sarcoidosis ([place]) 03/13/2014  . Airway hyperreactivity 02/15/2013   Janna Arch, SPT   This entire session was performed under direct supervision and direction of a licensed therapist/therapist assistant . I have personally read, edited and approve of the note as written.  Collie Siad PT, DPT  12/06/2016, 9:19 AM  [facility] [place] [facility] [address] [place], [place], [address] Phone: [phone]   Fax:  [phone]  Name: LAZETTE ESTALA MRN: [id number] Date of Birth: 09-Jul-1951

## 2016-12-06 ENCOUNTER — Encounter: Payer: Self-pay | Admitting: Physical Therapy

## 2016-12-07 ENCOUNTER — Ambulatory Visit: Payer: Medicare Other | Admitting: Physical Therapy

## 2016-12-07 ENCOUNTER — Encounter: Payer: Medicare Other | Admitting: Physical Therapy

## 2016-12-07 DIAGNOSIS — M6281 Muscle weakness (generalized): Secondary | ICD-10-CM

## 2016-12-07 DIAGNOSIS — M2560 Stiffness of unspecified joint, not elsewhere classified: Secondary | ICD-10-CM

## 2016-12-07 DIAGNOSIS — R293 Abnormal posture: Secondary | ICD-10-CM

## 2016-12-07 DIAGNOSIS — M542 Cervicalgia: Secondary | ICD-10-CM | POA: Diagnosis not present

## 2016-12-08 ENCOUNTER — Encounter: Payer: Self-pay | Admitting: Physical Therapy

## 2016-12-08 NOTE — Therapy (Cosign Needed)
[facility] [address]. [place], [place], [address] Phone: [phone]   Fax:  [phone]  Physical Therapy Treatment  Patient Details  Name: Megan Daniels MRN: [id number] Date of Birth: 05-Mar-1951 Referring Provider: Dr. Arnoldo Morale  Encounter Date: 12/07/2016      PT End of Session - 12/08/16 0940    Visit Number 7   Number of Visits 8   Date for PT Re-Evaluation 12/14/16   Authorization - Visit Number 7   Authorization - Number of Visits 10   PT Start Time 0905   PT Stop Time 0946   PT Time Calculation (min) 41 min   Activity Tolerance Patient tolerated treatment well;Patient limited by pain   Behavior During Therapy [facility] for tasks assessed/performed      Past Medical History:  Diagnosis Date  . Abnormal Pap smear of cervix    pos hpv-   . Acid reflux   . Arthritis   . Asthma    Symbicort as needed  . Cancer ([place])    skin  . Chalazion of left eye   . Chronic back pain    spondylolisthesis/stenosis  . Chronic UTI    was treated for 3 months with Macrobid and in Dec everything was fine  . Constipation   . COPD (chronic obstructive pulmonary disease) ([place])   . Diverticulosis   . Elevated serum creatinine    with nsaids  . Foot drop    right  . Genital warts    h/o  . GERD (gastroesophageal reflux disease)    takes Prevacid daily  . Headache   . History of bronchitis   . History of colon polyps   . History of hiatal hernia   . Hypertension    takes Losartan daily  . Joint pain   . Palpitations    takes Metoprolol daily  . Pneumonia    hx of-early 2000's  . PONV (postoperative nausea and vomiting)    extreme nausea and vomiting -none last surgery 10/29/14  . Sarcoidosis ([place])   . Seasonal allergies    takes Claritin daily  . Shortness of breath dyspnea   . Sleep apnea    cpap 10 yrs  . Stress incontinence   . URI (upper respiratory infection)    treated self with Mucinex and no fever  . Urinary  urgency   . Vitamin D deficiency   . Weakness    numbness and tingling in both legs    Past Surgical History:  Procedure Laterality Date  . ABDOMINAL HYSTERECTOMY    . ANTERIOR CERVICAL DECOMP/DISCECTOMY FUSION N/A 10/06/2016   Procedure: ANTERIOR CERVICAL DECOMPRESSION/DISCECTOMY FUSION , INTERBODY PROSTHESIS, PLATE CERVICAL FOUR CERVICAL FIVE, CERVICAL SIX- CERVICAL SEVEN;  Surgeon: Newman Pies, MD;  Location: [place];  Service: Neurosurgery;  Laterality: N/A;  anterior  . BACK SURGERY    . BREAST BIOPSY Right 1996   EXCISIONAL - NEG  . BUNIONECTOMY Bilateral   . CARPAL TUNNEL RELEASE Right    x 2  . cataract surgery Bilateral   . CERVICAL FUSION    . COLONOSCOPY    . ESOPHAGOGASTRODUODENOSCOPY    . EXPLORATORY LAPAROTOMY    . left middle finger surgery     pin- arthritic cyst  . LUMBAR LAMINECTOMY/DECOMPRESSION MICRODISCECTOMY Right 05/04/2015   Procedure: RIGHT LUMBAR TWO-THREE/THREE-FOUR LAMINECTOMY/DECOMPRESSION ;  Surgeon: Newman Pies, MD;  Location: [place] [facility];  Service: Neurosurgery;  Laterality: Right;  Right L23 L34 laminectomy and foraminotomy  .  Nissan fundoplication    . TONSILLECTOMY      There were no vitals filed for this visit.      Subjective Assessment - 12/08/16 0938    Subjective Patient noticed she is able to move her neck more. Increased pain on left upper trap region. Worried about returning for work in two weeks time.    Pertinent History see past PT notes and MD surgical report    Diagnostic tests XRAYS   Patient Stated Goals Hoping to return to work 12/19/16.     Currently in Pain? Yes   Pain Score 4    Pain Location Neck   Pain Orientation Left;Lower   Pain Descriptors / Indicators Aching      Manual STM bilateral upper trap, levator scapulae, neck musculature and upper rhomboids in seated position with forward flexed trunk and head resting on table for relaxed cervical muscualture Desensitization towel rub applied to left  cervical/upper trap musculature due to pt beginning to show signs of possible complex regional pain syndrome.   TherEx Isometrics in seated position: educated on utilizing hand and 10% strength.  Chin tucks in seated position with towel support1x10.  Chin tucks standing against wall Scapular retraction against wall 10x5 second holds Standing posture against wall 10x5 squeezes AROM in seated position with deep pressure applied to cervical musculature and trapezius region.  AAROM in seated position with utilization of towel. Educated on proper technique.   Pt. Response to medical necessity: Pt. Will benefit from increased cervical ROM, strength, and posture for functional activities such as driving, returning to work, and household activities       PT Education - 12/08/16 [phone]    Education provided Yes   Education Details proper isometric form, utilization of towel with AAROM   Person(s) Educated Patient   Methods Explanation;Demonstration   Comprehension Verbalized understanding;Returned demonstration             PT Long Term Goals - 11/16/16 1551      PT LONG TERM GOAL #1   Title Pt. will decrease NDI to <30% to improve functional mobility/ return to work.     Baseline NDI 52% self-perceived severe disability on 11/16/16   Time 4   Period Weeks   Status New     PT LONG TERM GOAL #2   Title Pt. independent with HEP to increase B UE muscle strength 1/2 muscle grade to improve pain-free mobility.     Baseline Cervical AROM: R rotn. (45 deg.), L rotn. (30 deg.)- pain limited.  R lat. flex. (15 deg.), L lat. flex. (16 deg.).  Flexion (30 deg.), ext. (16 deg.).  B UE AROM WFL with L shoulder abd. limited to 130 deg. with RTC discomfort during eccentric muscle control.  L UE grossly 4+/5 MMT.  R UE grossly 4+/5 MMT except tricep 4/5 MMT.  R grip 23# and L grip 20#   Time 4   Period Weeks   Status New     PT LONG TERM GOAL #3   Title Pt. able to demonstrate proper lifting  technique/ B carrying with no c/o neck pain.    Baseline pt. has not attempted lifting at this time.     Time 4   Period Weeks   Status New     PT LONG TERM GOAL #4   Title Pt. able to sleep in bed with no increase c/o pain or cervical brace to improve pain-free mobility.    Baseline pain limited in bed/ sleeping  without use of neck brace in sidesleeping position.    Time 4   Period Weeks   Status New     PT LONG TERM GOAL #5   Title Pt. able to return to work with no neck pain or limitations to improve return to PLOF.     Baseline pt. is not working at this time   Time 4   Period Weeks   Status New               Plan - 12/08/16 1044    Clinical Impression Statement Patient presented with increased AROM and AAROM cervical motion with decreased episodes of spasms. Cervical rotation AAROM with towel and chin tucks were implemented and educated with good body mechanics and ROM. Pt. was attempting isometrics with improper form and was educated on proper biomechanics. Deep pressure decreases pt.'s fear of movement and spasms allowing increased ROM and strength. Patient is less fearful of movement now but worried about implementation of work in the following two weeks. Patient will continue to benefit from skilled physical therapy to improve neck ROM and strength for return to work.   Rehab Potential Good   Clinical Impairments Affecting Rehab Potential previous back surgeries.    PT Frequency 2x / week   PT Duration 4 weeks   PT Treatment/Interventions ADLs/Self Care Home Management;Cryotherapy;Moist Heat;Balance training;Therapeutic exercise;Therapeutic activities;Functional mobility training;Stair training;Gait training;Neuromuscular re-education;Manual techniques;Passive range of motion;Scar mobilization;Patient/family education   PT Next Visit Plan RECERT   PT Home Exercise Plan See handouts   Consulted and Agree with Plan of Care Patient      Patient will benefit from  skilled therapeutic intervention in order to improve the following deficits and impairments:  Decreased endurance, Improper body mechanics, Impaired flexibility, Decreased strength, Decreased mobility, Pain, Decreased range of motion, Hypomobility, Postural dysfunction  Visit Diagnosis: Neck pain  Joint stiffness  Muscle weakness (generalized)  Abnormal posture     Problem List Patient Active Problem List   Diagnosis Date Noted  . Cervical spondylosis with radiculopathy 10/06/2016  . Family history of breast cancer in mother 07/02/2015  . History of total vaginal hysterectomy 07/02/2015  . Lumbar stenosis 05/04/2015  . Spondylolisthesis of lumbar region 10/29/2014  . BP (high blood pressure) 03/13/2014  . HLD (hyperlipidemia) 03/13/2014  . Awareness of heartbeats 03/13/2014  . Besnier-Boeck disease ([place]) 03/13/2014  . Sarcoidosis ([place]) 03/13/2014  . Airway hyperreactivity 02/15/2013    Janna Arch, SPT  This entire session was performed under direct supervision and direction of a licensed therapist/therapist assistant . I have personally read, edited and approve of the note as written. Collie Siad PT, DPT 12/08/2016, 11:13 AM  [facility] [address], [place], [address] Phone: [phone]   Fax:  [phone]  Name: Megan Daniels MRN: [id number] Date of Birth: [birth year]

## 2016-12-12 ENCOUNTER — Ambulatory Visit: Payer: Medicare Other | Admitting: Physical Therapy

## 2016-12-12 ENCOUNTER — Encounter: Payer: Self-pay | Admitting: Physical Therapy

## 2016-12-12 DIAGNOSIS — R293 Abnormal posture: Secondary | ICD-10-CM

## 2016-12-12 DIAGNOSIS — M542 Cervicalgia: Secondary | ICD-10-CM | POA: Diagnosis not present

## 2016-12-12 DIAGNOSIS — M2560 Stiffness of unspecified joint, not elsewhere classified: Secondary | ICD-10-CM

## 2016-12-12 DIAGNOSIS — M6281 Muscle weakness (generalized): Secondary | ICD-10-CM

## 2016-12-12 NOTE — Therapy (Signed)
[facility] [address] [place], [place], [address] Phone: [phone]   Fax:  [phone]  Physical Therapy Treatment  Patient Details  Name: Megan Daniels MRN: [id number] Date of Birth: 1950-09-11 Referring Provider: Dr. Arnoldo Morale  Encounter Date: 12/12/2016      PT End of Session - 12/12/16 0841    Visit Number 8   Number of Visits 12   Date for PT Re-Evaluation 01/09/17   Authorization - Visit Number 1   Authorization - Number of Visits 10   PT Start Time 0821   PT Stop Time 0914   PT Time Calculation (min) 53 min   Activity Tolerance Patient tolerated treatment well;Patient limited by pain   Behavior During Therapy [facility] for tasks assessed/performed      Past Medical History:  Diagnosis Date  . Abnormal Pap smear of cervix    pos hpv-   . Acid reflux   . Arthritis   . Asthma    Symbicort as needed  . Cancer ([place])    skin  . Chalazion of left eye   . Chronic back pain    spondylolisthesis/stenosis  . Chronic UTI    was treated for 3 months with Macrobid and in Dec everything was fine  . Constipation   . COPD (chronic obstructive pulmonary disease) ([place])   . Diverticulosis   . Elevated serum creatinine    with nsaids  . Foot drop    right  . Genital warts    h/o  . GERD (gastroesophageal reflux disease)    takes Prevacid daily  . Headache   . History of bronchitis   . History of colon polyps   . History of hiatal hernia   . Hypertension    takes Losartan daily  . Joint pain   . Palpitations    takes Metoprolol daily  . Pneumonia    hx of-early 2000's  . PONV (postoperative nausea and vomiting)    extreme nausea and vomiting -none last surgery 10/29/14  . Sarcoidosis ([place])   . Seasonal allergies    takes Claritin daily  . Shortness of breath dyspnea   . Sleep apnea    cpap 10 yrs  . Stress incontinence   . URI (upper respiratory infection)    treated self with Mucinex and no fever  . Urinary  urgency   . Vitamin D deficiency   . Weakness    numbness and tingling in both legs    Past Surgical History:  Procedure Laterality Date  . ABDOMINAL HYSTERECTOMY    . ANTERIOR CERVICAL DECOMP/DISCECTOMY FUSION N/A 10/06/2016   Procedure: ANTERIOR CERVICAL DECOMPRESSION/DISCECTOMY FUSION , INTERBODY PROSTHESIS, PLATE CERVICAL FOUR CERVICAL FIVE, CERVICAL SIX- CERVICAL SEVEN;  Surgeon: Newman Pies, MD;  Location: [place];  Service: Neurosurgery;  Laterality: N/A;  anterior  . BACK SURGERY    . BREAST BIOPSY Right 1996   EXCISIONAL - NEG  . BUNIONECTOMY Bilateral   . CARPAL TUNNEL RELEASE Right    x 2  . cataract surgery Bilateral   . CERVICAL FUSION    . COLONOSCOPY    . ESOPHAGOGASTRODUODENOSCOPY    . EXPLORATORY LAPAROTOMY    . left middle finger surgery     pin- arthritic cyst  . LUMBAR LAMINECTOMY/DECOMPRESSION MICRODISCECTOMY Right 05/04/2015   Procedure: RIGHT LUMBAR TWO-THREE/THREE-FOUR LAMINECTOMY/DECOMPRESSION ;  Surgeon: Newman Pies, MD;  Location: [place] [facility];  Service: Neurosurgery;  Laterality: Right;  Right L23 L34 laminectomy and foraminotomy  .  Nissan fundoplication    . TONSILLECTOMY      There were no vitals filed for this visit.      Subjective Assessment - 12/12/16 0834    Subjective Pt reports she notices she tenses up with her exercises which increases her pain in her neck.  Pt reports she is typically sitting in her recliner with her neck supported so that when she sits without her neck supported it is uncomfortable in her neck.  She brought in her ergonomic laptop platform that she purchased on [employer].  She will be traveling to the beach this Thursday and would like to avoid any aggravating activities during her appointment on Wednesday.  She will be returning to work next week and would like to decrease her frequency to 1x/wk due to her busy schedule.   Pertinent History see past PT notes and MD surgical report    Diagnostic tests XRAYS   Patient  Stated Goals Hoping to return to work 12/19/16.     Currently in Pain? Yes   Pain Score 3    Pain Location Neck   Pain Orientation Posterior;Left   Pain Descriptors / Indicators Aching   Pain Type Chronic pain   Pain Onset More than a month ago   Multiple Pain Sites No      TREATMENT   NDI: 32   R grip: 27.2, 23.7, 24.2 (Average: 25.03 lbs)  L grip: 28.2, 29.6, 23.8 (Average: 27.2 lbs)   AROM Cervical Spine (in deg):  F: 28  E: 20  L, R Rot: 39, 34  L,R sidebend: 18, 14   Manual Therapy:  STM L upper trap, levator scapulae, neck musculature in seated position    Therapeutic Exercise:  Cervical rotation isometrics in seated position with heat pad over upper back region: educated on utilizing hand and 10% strength. X5 each direction   AROM cervical rotation in seated position with heat pad over upper back region x10 each direction   Pt reports lifting blood pressure cuff from chair height often at work as well as picking up and carrying her work bag into and out of the office. Pt demonstrated picking up cone and purse reaching with one UE and rotating trunk and cervical spine. Instructed pt to pick up items with both hands and to approach straight on rather than out to the side to avoid aggravating cervical spine.   In standing pt moving cone from lower shelf to overhead shelf with cues to follow cone with eyes and head to promote functional cervical extension. x5. Pt performs with guarded posture and muscle spasm x1, without recurrence with cues to move smoothly rather than quickly.   Chin tucks in standing x5 with 5 second holds with demonstration and verbal cues to ease into chin tuck rather than moving quickly. Pt reports this is less irritating than how she was previously completing it at home.  Extensive education provided on the importance of easing into exercises and performing smoothly rather than doing them quickly to get them done. Focus is on quality, not quantity  which has the tendency to result in muscle spasms.   Additional education provided on gradually decreasing time spent in recliner with neck resting back on headrest. Challenged pt to gradually increase time spent in regular chair. Explained the goal of improving endurance of DNFs rather than relying on constant support when sitting to improve tolerance in sitting when at church or at work.  PT Education - 12/12/16 0840    Education provided Yes   Education Details Reviewed chin tuck HEP, discussed gradual decreasing time spent in recliner or with neck supported, exercise technique, lifting technique   Person(s) Educated Patient   Methods Explanation;Demonstration;Verbal cues;Tactile cues   Comprehension Verbalized understanding;Need further instruction;Returned demonstration             PT Long Term Goals - 12/12/16 1030      PT LONG TERM GOAL #1   Title Pt. will decrease NDI to <30% to improve functional mobility/ return to work.     Baseline NDI 52% self-perceived severe disability on 11/16/16; on 12/12/16 32%   Time 4   Period Weeks   Status On-going     PT LONG TERM GOAL #2   Title Pt. independent with HEP to increase B UE muscle strength 1/2 muscle grade to improve pain-free mobility.     Baseline For measurements on 4/9 see treatment note; Cervical AROM: R rotn. (45 deg.), L rotn. (30 deg.)- pain limited.  R lat. flex. (15 deg.), L lat. flex. (16 deg.).  Flexion (30 deg.), ext. (16 deg.).  B UE AROM WFL with L shoulder abd. limited to 130 deg. with RTC discomfort during eccentric muscle control.  L UE grossly 4+/5 MMT.  R UE grossly 4+/5 MMT except tricep 4/5 MMT.  R grip 23# and L grip 20#   Time 4   Period Weeks   Status On-going     PT LONG TERM GOAL #3   Title Pt. able to demonstrate proper lifting technique/ B carrying with no c/o neck pain.    Baseline Addressed on 4/9 with cues for proper technique   Time 4   Period Weeks   Status On-going      PT LONG TERM GOAL #4   Title Pt. able to sleep in bed with no increase c/o pain or cervical brace to improve pain-free mobility.    Baseline pain limited in bed/ sleeping without use of neck brace in sidesleeping position.    Time 4   Period Weeks   Status On-going     PT LONG TERM GOAL #5   Title Pt. able to return to work with no neck pain or limitations to improve return to PLOF.     Baseline pt. is not working at this time, is to return to work the week of 4/16   Time 4   Period Weeks   Status On-going               Plan - 12/12/16 1037    Clinical Impression Statement Pt has made significant improvement in self perceived disability as demonstrated by NDI improving from 52% on initial evaluation to 32% today.  Her cervical ROM remains limited due to pt's fear of pain with mobility and resultant muscule spasms, although this has improved since evaluation.  Multiple strategies have been implemented to decrease pt's hypersensitivity in cervical region including heat, ice, deep pressure, isometrics, and towel desensitization techniques and these efforts will continue to decrease pt's pain with functional mobility.  Reviewed return to work activities today, including lifting, with cues for proper technique to decrease impact on cervical region. Pt has requested to decrease PT frequency to 1x/wk to fit work schedule.  Pt will benefit from continued skilled PT interventions for improved ROM, strength, functional mobility, and decreased pain with improved QOL.   Rehab Potential Good   Clinical Impairments Affecting Rehab Potential previous back surgeries.  PT Frequency 1x / week   PT Duration 4 weeks   PT Treatment/Interventions ADLs/Self Care Home Management;Cryotherapy;Moist Heat;Balance training;Therapeutic exercise;Therapeutic activities;Functional mobility training;Stair training;Gait training;Neuromuscular re-education;Manual techniques;Passive range of motion;Scar  mobilization;Patient/family education   PT Next Visit Plan --   PT Home Exercise Plan See handouts   Consulted and Agree with Plan of Care Patient      Patient will benefit from skilled therapeutic intervention in order to improve the following deficits and impairments:  Decreased endurance, Improper body mechanics, Impaired flexibility, Decreased strength, Decreased mobility, Pain, Decreased range of motion, Hypomobility, Postural dysfunction  Visit Diagnosis: Neck pain - Plan: PT plan of care cert/re-cert  Joint stiffness - Plan: PT plan of care cert/re-cert  Muscle weakness (generalized) - Plan: PT plan of care cert/re-cert  Abnormal posture - Plan: PT plan of care cert/re-cert       G-Codes - 01/02/17 1049    Functional Assessment Tool Used (Outpatient Only) NDI, ROM, functional activities, strength, clinical judgement   Functional Limitation Changing and maintaining body position   Changing and Maintaining Body Position Current Status (Y7062) At least 40 percent but less than 60 percent impaired, limited or restricted   Changing and Maintaining Body Position Goal Status (B7628) At least 1 percent but less than 20 percent impaired, limited or restricted      Problem List Patient Active Problem List   Diagnosis Date Noted  . Cervical spondylosis with radiculopathy 10/06/2016  . Family history of breast cancer in mother 07/02/2015  . History of total vaginal hysterectomy 07/02/2015  . Lumbar stenosis 05/04/2015  . Spondylolisthesis of lumbar region 10/29/2014  . BP (high blood pressure) 03/13/2014  . HLD (hyperlipidemia) 03/13/2014  . Awareness of heartbeats 03/13/2014  . Besnier-Boeck disease ([place]) 03/13/2014  . Sarcoidosis ([place]) 03/13/2014  . Airway hyperreactivity 02/15/2013    Collie Siad PT, DPT 01/02/2017, 10:55 AM  [facility] [address] [place], [place], [address] Phone: [phone]   Fax:   [phone]  Name: Megan Daniels MRN: [id number] Date of Birth: March 16, 1951

## 2016-12-14 ENCOUNTER — Ambulatory Visit: Payer: Medicare Other | Admitting: Physical Therapy

## 2016-12-14 DIAGNOSIS — M542 Cervicalgia: Secondary | ICD-10-CM

## 2016-12-14 DIAGNOSIS — M2560 Stiffness of unspecified joint, not elsewhere classified: Secondary | ICD-10-CM

## 2016-12-14 DIAGNOSIS — R293 Abnormal posture: Secondary | ICD-10-CM

## 2016-12-14 DIAGNOSIS — M6281 Muscle weakness (generalized): Secondary | ICD-10-CM

## 2016-12-14 NOTE — Therapy (Signed)
[facility] [address] [place], [place], [address] Phone: [phone]   Fax:  [phone]  Physical Therapy Treatment  Patient Details  Name: Megan Daniels MRN: [id number] Date of Birth: 11-21-1950 Referring Provider: Dr. Arnoldo Morale  Encounter Date: 12/14/2016      PT End of Session - 12/14/16 0934    Visit Number 9   Number of Visits 12   Date for PT Re-Evaluation 01/09/17   Authorization - Visit Number 2   Authorization - Number of Visits 10   PT Start Time 0817   PT Stop Time 0907   PT Time Calculation (min) 50 min   Activity Tolerance Patient tolerated treatment well;Patient limited by pain   Behavior During Therapy [facility] for tasks assessed/performed      Past Medical History:  Diagnosis Date  . Abnormal Pap smear of cervix    pos hpv-   . Acid reflux   . Arthritis   . Asthma    Symbicort as needed  . Cancer ([place])    skin  . Chalazion of left eye   . Chronic back pain    spondylolisthesis/stenosis  . Chronic UTI    was treated for 3 months with Macrobid and in Dec everything was fine  . Constipation   . COPD (chronic obstructive pulmonary disease) ([place])   . Diverticulosis   . Elevated serum creatinine    with nsaids  . Foot drop    right  . Genital warts    h/o  . GERD (gastroesophageal reflux disease)    takes Prevacid daily  . Headache   . History of bronchitis   . History of colon polyps   . History of hiatal hernia   . Hypertension    takes Losartan daily  . Joint pain   . Palpitations    takes Metoprolol daily  . Pneumonia    hx of-early 2000's  . PONV (postoperative nausea and vomiting)    extreme nausea and vomiting -none last surgery 10/29/14  . Sarcoidosis ([place])   . Seasonal allergies    takes Claritin daily  . Shortness of breath dyspnea   . Sleep apnea    cpap 10 yrs  . Stress incontinence   . URI (upper respiratory infection)    treated self with Mucinex and no fever  . Urinary  urgency   . Vitamin D deficiency   . Weakness    numbness and tingling in both legs    Past Surgical History:  Procedure Laterality Date  . ABDOMINAL HYSTERECTOMY    . ANTERIOR CERVICAL DECOMP/DISCECTOMY FUSION N/A 10/06/2016   Procedure: ANTERIOR CERVICAL DECOMPRESSION/DISCECTOMY FUSION , INTERBODY PROSTHESIS, PLATE CERVICAL FOUR CERVICAL FIVE, CERVICAL SIX- CERVICAL SEVEN;  Surgeon: Newman Pies, MD;  Location: [place];  Service: Neurosurgery;  Laterality: N/A;  anterior  . BACK SURGERY    . BREAST BIOPSY Right 1996   EXCISIONAL - NEG  . BUNIONECTOMY Bilateral   . CARPAL TUNNEL RELEASE Right    x 2  . cataract surgery Bilateral   . CERVICAL FUSION    . COLONOSCOPY    . ESOPHAGOGASTRODUODENOSCOPY    . EXPLORATORY LAPAROTOMY    . left middle finger surgery     pin- arthritic cyst  . LUMBAR LAMINECTOMY/DECOMPRESSION MICRODISCECTOMY Right 05/04/2015   Procedure: RIGHT LUMBAR TWO-THREE/THREE-FOUR LAMINECTOMY/DECOMPRESSION ;  Surgeon: Newman Pies, MD;  Location: [place] NEUR[facility] ORS;  Service: Neurosurgery;  Laterality: Right;  Right L23 L34 laminectomy and foraminotomy  .  Nissan fundoplication    . TONSILLECTOMY      There were no vitals filed for this visit.      Subjective Assessment - 12/14/16 0932    Subjective Patient is leaving for beach tomorrow morning and will return to work on Monday. She is worried about the heavy lifting required due to her frequent neck spasms and pain and requested a letter for lighter duties initially until her cervical strength returns.    Pertinent History see past PT notes and MD surgical report    Diagnostic tests XRAYS   Patient Stated Goals Hoping to return to work 12/19/16.     Currently in Pain? Yes   Pain Score 2    Pain Location Neck   Pain Orientation Left;Posterior   Pain Onset More than a month ago     Manual STM L upper trap, levator scapulae, neck musculature in seated position   [employer] Workplace set up:   Reaching forward to  fold towel, cues for body mechanics  Sliding towel across raised bed/plinthe to different cone colors guided by PT directions with different patterning.  Pt. Has spasms is posterior cervical region upon returning to baseline.   Sliding towel and weighted ball in different hands towards different cone colors guided by PT direction with different patterning. Pt. Has spasms when bending over and returning to standing up straight.   Cervical rotation isometrics in seated position with heat pad over upper back region: educated on utilizing hand and 10% strength. X4 each direction  AAROM in seated position with utilization of towel. Educated on proper technique.  Pt. Educated on proper body mechanics during work and recovery activities to reduce muscle spasms.   Pt. Response to medical necessity: Pt. Will benefit from increased cervical ROM, strength, and posture for functional activities such as driving, returning to work, and household activities       PT Long Term Goals - 12/12/16 [place] #1   Title Pt. will decrease NDI to <30% to improve functional mobility/ return to work.     Baseline NDI 52% self-perceived severe disability on 11/16/16; on 12/12/16 32%   Time 4   Period Weeks   Status On-going     PT LONG TERM GOAL #2   Title Pt. independent with HEP to increase B UE muscle strength 1/2 muscle grade to improve pain-free mobility.     Baseline For measurements on 4/9 see treatment note; Cervical AROM: R rotn. (45 deg.), L rotn. (30 deg.)- pain limited.  R lat. flex. (15 deg.), L lat. flex. (16 deg.).  Flexion (30 deg.), ext. (16 deg.).  B UE AROM WFL with L shoulder abd. limited to 130 deg. with RTC discomfort during eccentric muscle control.  L UE grossly 4+/5 MMT.  R UE grossly 4+/5 MMT except tricep 4/5 MMT.  R grip 23# and L grip 20#   Time 4   Period Weeks   Status On-going     PT LONG TERM GOAL #3   Title Pt. able to demonstrate proper lifting technique/ B  carrying with no c/o neck pain.    Baseline Addressed on 4/9 with cues for proper technique   Time 4   Period Weeks   Status On-going     PT LONG TERM GOAL #4   Title Pt. able to sleep in bed with no increase c/o pain or cervical brace to improve pain-free mobility.    Baseline pain limited in  bed/ sleeping without use of neck brace in sidesleeping position.    Time 4   Period Weeks   Status On-going     PT LONG TERM GOAL #5   Title Pt. able to return to work with no neck pain or limitations to improve return to PLOF.     Baseline pt. is not working at this time, is to return to work the week of 4/16   Time [place] - 12/14/16 0737    Clinical Impression Statement Patient is continuing to improve with mobility however is still self-limiting due to pain avoidance mindset. Muscle spasms induced when pt. is fearful of movement or moves too quickly. Isometrics continue to require frequent body mechanic cueing and re-education. 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 Pt. demonstrated understanding and had occasional muscle spasms when returning to upright position. Patient will continue benefit from skilled physical therapy to improve neck ROM and strength for return to work.    Rehab Potential Good   Clinical Impairments Affecting Rehab Potential previous back surgeries.    PT Frequency 1x / week   PT Duration 4 weeks   PT Treatment/Interventions ADLs/Self Care Home Management;Cryotherapy;Moist Heat;Balance training;Therapeutic exercise;Therapeutic activities;Functional mobility training;Stair training;Gait training;Neuromuscular re-education;Manual techniques;Passive range of motion;Scar mobilization;Patient/family education   PT Next Visit Plan review work set up   [place] See  handouts   Consulted and Agree with Plan of Care Patient      Patient will benefit from skilled therapeutic intervention in order to improve the following deficits and impairments:  Decreased endurance, Improper body mechanics, Impaired flexibility, Decreased strength, Decreased mobility, Pain, Decreased range of motion, Hypomobility, Postural dysfunction  Visit Diagnosis: Neck pain  Joint stiffness  Muscle weakness (generalized)  Abnormal posture     Problem List Patient Active Problem List   Diagnosis Date Noted  . Cervical spondylosis with radiculopathy 10/06/2016  . Family history of breast cancer in mother 07/02/2015  . History of total vaginal hysterectomy 07/02/2015  . Lumbar stenosis 05/04/2015  . Spondylolisthesis of lumbar region 10/29/2014  . BP (high blood pressure) 03/13/2014  . HLD (hyperlipidemia) 03/13/2014  . Awareness of heartbeats 03/13/2014  . Besnier-Boeck disease ([place]) 03/13/2014  . Sarcoidosis ([place]) 03/13/2014  . Airway hyperreactivity 02/15/2013   Pura Spice, PT, DPT # [id number] Janna Arch, SPT 12/15/2016, 10:27 AM  [facility] [address] [place], [place], [address] Phone: [phone]   Fax:  [phone]  Name: Megan Daniels MRN: [id number] Date of Birth: 1951-03-08

## 2016-12-15 ENCOUNTER — Encounter: Payer: Self-pay | Admitting: Physical Therapy

## 2016-12-21 ENCOUNTER — Ambulatory Visit: Payer: Medicare Other | Admitting: Physical Therapy

## 2016-12-21 DIAGNOSIS — M542 Cervicalgia: Secondary | ICD-10-CM

## 2016-12-21 DIAGNOSIS — M2560 Stiffness of unspecified joint, not elsewhere classified: Secondary | ICD-10-CM

## 2016-12-21 DIAGNOSIS — R293 Abnormal posture: Secondary | ICD-10-CM

## 2016-12-21 DIAGNOSIS — M6281 Muscle weakness (generalized): Secondary | ICD-10-CM

## 2016-12-22 ENCOUNTER — Encounter: Payer: Self-pay | Admitting: Physical Therapy

## 2016-12-22 NOTE — Therapy (Addendum)
[facility] [address] [place], [place], [address] Phone: [phone]   Fax:  [phone]  Physical Therapy Treatment  Patient Details  Name: Megan Daniels MRN: [id number] Date of Birth: 09-Nov-1950 Referring Provider: Dr. Arnoldo Morale  Encounter Date: 12/21/2016      PT End of Session - 12/22/16 1242    Visit Number 10   Number of Visits 12   Date for PT Re-Evaluation 01/09/17   Authorization - Visit Number 3   Authorization - Number of Visits 10   PT Start Time 1612   PT Stop Time 1701   PT Time Calculation (min) 49 min   Activity Tolerance Patient tolerated treatment well;Patient limited by pain   Behavior During Therapy [facility] for tasks assessed/performed      Past Medical History:  Diagnosis Date  . Abnormal Pap smear of cervix    pos hpv-   . Acid reflux   . Arthritis   . Asthma    Symbicort as needed  . Cancer ([place])    skin  . Chalazion of left eye   . Chronic back pain    spondylolisthesis/stenosis  . Chronic UTI    was treated for 3 months with Macrobid and in Dec everything was fine  . Constipation   . COPD (chronic obstructive pulmonary disease) ([place])   . Diverticulosis   . Elevated serum creatinine    with nsaids  . Foot drop    right  . Genital warts    h/o  . GERD (gastroesophageal reflux disease)    takes Prevacid daily  . Headache   . History of bronchitis   . History of colon polyps   . History of hiatal hernia   . Hypertension    takes Losartan daily  . Joint pain   . Palpitations    takes Metoprolol daily  . Pneumonia    hx of-early 2000's  . PONV (postoperative nausea and vomiting)    extreme nausea and vomiting -none last surgery 10/29/14  . Sarcoidosis   . Seasonal allergies    takes Claritin daily  . Shortness of breath dyspnea   . Sleep apnea    cpap 10 yrs  . Stress incontinence   . URI (upper respiratory infection)    treated self with Mucinex and no fever  . Urinary  urgency   . Vitamin D deficiency   . Weakness    numbness and tingling in both legs    Past Surgical History:  Procedure Laterality Date  . ABDOMINAL HYSTERECTOMY    . ANTERIOR CERVICAL DECOMP/DISCECTOMY FUSION N/A 10/06/2016   Procedure: ANTERIOR CERVICAL DECOMPRESSION/DISCECTOMY FUSION , INTERBODY PROSTHESIS, PLATE CERVICAL FOUR CERVICAL FIVE, CERVICAL SIX- CERVICAL SEVEN;  Surgeon: Newman Pies, MD;  Location: [place];  Service: Neurosurgery;  Laterality: N/A;  anterior  . BACK SURGERY    . BREAST BIOPSY Right 1996   EXCISIONAL - NEG  . BUNIONECTOMY Bilateral   . CARPAL TUNNEL RELEASE Right    x 2  . cataract surgery Bilateral   . CERVICAL FUSION    . COLONOSCOPY    . ESOPHAGOGASTRODUODENOSCOPY    . EXPLORATORY LAPAROTOMY    . left middle finger surgery     pin- arthritic cyst  . LUMBAR LAMINECTOMY/DECOMPRESSION MICRODISCECTOMY Right 05/04/2015   Procedure: RIGHT LUMBAR TWO-THREE/THREE-FOUR LAMINECTOMY/DECOMPRESSION ;  Surgeon: Newman Pies, MD;  Location: [place] [facility]UR[facility];  Service: Neurosurgery;  Laterality: Right;  Right L23 L34 laminectomy and foraminotomy  . Nissan  fundoplication    . TONSILLECTOMY      There were no vitals filed for this visit.      Subjective Assessment - 12/22/16 1242    Pertinent History see past PT notes and MD surgical report    Diagnostic tests XRAYS   Patient Stated Goals Hoping to return to work 12/19/16.     Currently in Pain? Yes      Pt. reports no new issues while at beach.  Pt. states the return to work note has helped.  Pt. didn't do any heavy lifting at work and states she did better than she thought.  Minimal c/o neck pain today with occasional muscle spasms in neck at night.     Manual STM L upper trap, levator scapulae, neck musculature in seated position.  Seated cervical AROM all planes (reassessment in posture/ ROM/ chin tucks).  Supine UT/levator stretches (4x each with static holds).  Gentle cervical traction in supine position  5x with hold (no pain).  Reassessed cervical isometrics but pt. Guards resulting in spasms.      Pt. Response to medical necessity: Pt. Will benefit from increased cervical ROM, strength, and posture for functional activities such as driving, returning to work, and household activities     Pt. had several c/o cervical paraspinal muscle spasms towards end of tx. (after gentle isometrics).  PT reviewed proper body mechanics when dealing with pts. at work.  Pt. has difficulty relaxing muscles during gentle cervical stretches.  Pt. able to correct upright posture/ chin tucks in seated posture.        PT Long Term Goals - 12/12/16 1030      PT LONG TERM GOAL #1   Title Pt. will decrease NDI to <30% to improve functional mobility/ return to work.     Baseline NDI 52% self-perceived severe disability on 11/16/16; on 12/12/16 32%   Time 4   Period Weeks   Status On-going     PT LONG TERM GOAL #2   Title Pt. independent with HEP to increase B UE muscle strength 1/2 muscle grade to improve pain-free mobility.     Baseline For measurements on 4/9 see treatment note; Cervical AROM: R rotn. (45 deg.), L rotn. (30 deg.)- pain limited.  R lat. flex. (15 deg.), L lat. flex. (16 deg.).  Flexion (30 deg.), ext. (16 deg.).  B UE AROM WFL with L shoulder abd. limited to 130 deg. with RTC discomfort during eccentric muscle control.  L UE grossly 4+/5 MMT.  R UE grossly 4+/5 MMT except tricep 4/5 MMT.  R grip 23# and L grip 20#   Time 4   Period Weeks   Status On-going     PT LONG TERM GOAL #3   Title Pt. able to demonstrate proper lifting technique/ B carrying with no c/o neck pain.    Baseline Addressed on 4/9 with cues for proper technique   Time 4   Period Weeks   Status On-going     PT LONG TERM GOAL #4   Title Pt. able to sleep in bed with no increase c/o pain or cervical brace to improve pain-free mobility.    Baseline pain limited in bed/ sleeping without use of neck brace in sidesleeping  position.    Time 4   Period Weeks   Status On-going     PT LONG TERM GOAL #5   Title Pt. able to return to work with no neck pain or limitations to improve return to PLOF.  Baseline pt. is not working at this time, is to return to work the week of 4/16   Time 4   Period Weeks   Status On-going             Plan - 12/22/16 1243    Rehab Potential Good   Clinical Impairments Affecting Rehab Potential previous back surgeries.    PT Frequency 1x / week   PT Duration 4 weeks   PT Treatment/Interventions ADLs/Self Care Home Management;Cryotherapy;Moist Heat;Balance training;Therapeutic exercise;Therapeutic activities;Functional mobility training;Stair training;Gait training;Neuromuscular re-education;Manual techniques;Passive range of motion;Scar mobilization;Patient/family education   PT Next Visit Plan Discuss return to work/ patient care   PT Home Exercise Plan See handouts   Consulted and Agree with Plan of Care Patient      Patient will benefit from skilled therapeutic intervention in order to improve the following deficits and impairments:  Decreased endurance, Improper body mechanics, Impaired flexibility, Decreased strength, Decreased mobility, Pain, Decreased range of motion, Hypomobility, Postural dysfunction  Visit Diagnosis: Neck pain  Joint stiffness  Muscle weakness (generalized)  Abnormal posture     Problem List Patient Active Problem List   Diagnosis Date Noted  . Cervical spondylosis with radiculopathy 10/06/2016  . Family history of breast cancer in mother 07/02/2015  . History of total vaginal hysterectomy 07/02/2015  . Lumbar stenosis 05/04/2015  . Spondylolisthesis of lumbar region 10/29/2014  . BP (high blood pressure) 03/13/2014  . HLD (hyperlipidemia) 03/13/2014  . Awareness of heartbeats 03/13/2014  . Besnier-Boeck disease 03/13/2014  . Sarcoidosis 03/13/2014  . Airway hyperreactivity 02/15/2013   Pura Spice, PT, DPT #  [id number] 12/22/2016, 12:44 PM  [facility] [address] [place], [place], [address] Phone: [phone]   Fax:  [phone]  Name: Megan Daniels MRN: [id number] Date of Birth: [birth year]

## 2016-12-28 ENCOUNTER — Ambulatory Visit: Payer: Medicare Other | Admitting: Physical Therapy

## 2016-12-28 DIAGNOSIS — G8929 Other chronic pain: Secondary | ICD-10-CM

## 2016-12-28 DIAGNOSIS — M545 Low back pain, unspecified: Secondary | ICD-10-CM

## 2016-12-28 DIAGNOSIS — M2560 Stiffness of unspecified joint, not elsewhere classified: Secondary | ICD-10-CM

## 2016-12-28 DIAGNOSIS — M6281 Muscle weakness (generalized): Secondary | ICD-10-CM

## 2016-12-28 DIAGNOSIS — M542 Cervicalgia: Secondary | ICD-10-CM

## 2016-12-28 DIAGNOSIS — R293 Abnormal posture: Secondary | ICD-10-CM

## 2016-12-29 NOTE — Therapy (Addendum)
[facility] [address] [place], [place], [address] Phone: [phone]   Fax:  [phone]  Physical Therapy Treatment  Patient Details  Name: Megan Daniels MRN: [id number] Date of Birth: [birth year] Referring Provider: Dr. Arnoldo Morale  Encounter Date: 12/28/2016      PT End of Session - 12/29/16 1053    Visit Number 11   Number of Visits 12   Date for PT Re-Evaluation 01/09/17   Authorization - Visit Number 4   Authorization - Number of Visits 10   PT Start Time 3790   PT Stop Time 1745   PT Time Calculation (min) 62 min   Activity Tolerance Patient tolerated treatment well;Patient limited by pain   Behavior During Therapy [facility] for tasks assessed/performed      Past Medical History:  Diagnosis Date  . Abnormal Pap smear of cervix    pos hpv-   . Acid reflux   . Arthritis   . Asthma    Symbicort as needed  . Cancer ([place])    skin  . Chalazion of left eye   . Chronic back pain    spondylolisthesis/stenosis  . Chronic UTI    was treated for 3 months with Macrobid and in Dec everything was fine  . Constipation   . COPD (chronic obstructive pulmonary disease) ([place])   . Diverticulosis   . Elevated serum creatinine    with nsaids  . Foot drop    right  . Genital warts    h/o  . GERD (gastroesophageal reflux disease)    takes Prevacid daily  . Headache   . History of bronchitis   . History of colon polyps   . History of hiatal hernia   . Hypertension    takes Losartan daily  . Joint pain   . Palpitations    takes Metoprolol daily  . Pneumonia    hx of-early 2000's  . PONV (postoperative nausea and vomiting)    extreme nausea and vomiting -none last surgery 10/29/14  . Sarcoidosis   . Seasonal allergies    takes Claritin daily  . Shortness of breath dyspnea   . Sleep apnea    cpap 10 yrs  . Stress incontinence   . URI (upper respiratory infection)    treated self with Mucinex and no fever  . Urinary  urgency   . Vitamin D deficiency   . Weakness    numbness and tingling in both legs    Past Surgical History:  Procedure Laterality Date  . ABDOMINAL HYSTERECTOMY    . ANTERIOR CERVICAL DECOMP/DISCECTOMY FUSION N/A 10/06/2016   Procedure: ANTERIOR CERVICAL DECOMPRESSION/DISCECTOMY FUSION , INTERBODY PROSTHESIS, PLATE CERVICAL FOUR CERVICAL FIVE, CERVICAL SIX- CERVICAL SEVEN;  Surgeon: Newman Pies, MD;  Location: [place];  Service: Neurosurgery;  Laterality: N/A;  anterior  . BACK SURGERY    . BREAST BIOPSY Right 1996   EXCISIONAL - NEG  . BUNIONECTOMY Bilateral   . CARPAL TUNNEL RELEASE Right    x 2  . cataract surgery Bilateral   . CERVICAL FUSION    . COLONOSCOPY    . ESOPHAGOGASTRODUODENOSCOPY    . EXPLORATORY LAPAROTOMY    . left middle finger surgery     pin- arthritic cyst  . LUMBAR LAMINECTOMY/DECOMPRESSION MICRODISCECTOMY Right 05/04/2015   Procedure: RIGHT LUMBAR TWO-THREE/THREE-FOUR LAMINECTOMY/DECOMPRESSION ;  Surgeon: Newman Pies, MD;  Location: [place] NEUR[facility]S;  Service: Neurosurgery;  Laterality: Right;  Right L23 L34 laminectomy and foraminotomy  . Nissan  fundoplication    . TONSILLECTOMY      There were no vitals filed for this visit.      Subjective Assessment - 12/29/16 1053    Pertinent History see past PT notes and MD surgical report    Diagnostic tests XRAYS   Patient Stated Goals Hoping to return to work 12/19/16.     Currently in Pain? Yes   Pain Score 2    Pain Location Neck   Pain Orientation Left;Posterior      Pt. states she took a 1/2 Flexerill in the AM and at lunch.  Pt. states she is doing better but will have occasional R neck radicular symptoms (lightening bolt).       Manual STM L upper trap, levator scapulae, neck musculature in seated position.  Seated cervical AROM all planes (reassessment in posture/ ROM/ chin tucks).  Supine UT/levator stretches (4x each with static holds).  Gentle cervical traction in supine position 5x with  hold (no pain).  Gentle cervical isometrics but pt. Has difficulty relaxing/ Guards resulting in spasms.  Therex.: Supine B shoulder wand ex. (all planes)- relaxing B UT musculature.  B shoulder abd./ flexion/ press-ups. Reviewed HEP.        Pt. Response to medical necessity: Pt. Will benefit from increased cervical ROM, strength, and posture for functional activities such as driving, returning to work, and household activities     Minimal c/o R cervical pain with varying radicular symptoms with cervical position changes.  Pt. continues to have muscle spasms with gentle isometrics/ posture correction exercises.  No increase c/o pain at end of tx.  Pt. independent with cervical stretches.           PT Long Term Goals - 12/12/16 1030      PT LONG TERM GOAL #1   Title Pt. will decrease NDI to <30% to improve functional mobility/ return to work.     Baseline NDI 52% self-perceived severe disability on 11/16/16; on 12/12/16 32%   Time 4   Period Weeks   Status On-going     PT LONG TERM GOAL #2   Title Pt. independent with HEP to increase B UE muscle strength 1/2 muscle grade to improve pain-free mobility.     Baseline For measurements on 4/9 see treatment note; Cervical AROM: R rotn. (45 deg.), L rotn. (30 deg.)- pain limited.  R lat. flex. (15 deg.), L lat. flex. (16 deg.).  Flexion (30 deg.), ext. (16 deg.).  B UE AROM WFL with L shoulder abd. limited to 130 deg. with RTC discomfort during eccentric muscle control.  L UE grossly 4+/5 MMT.  R UE grossly 4+/5 MMT except tricep 4/5 MMT.  R grip 23# and L grip 20#   Time 4   Period Weeks   Status On-going     PT LONG TERM GOAL #3   Title Pt. able to demonstrate proper lifting technique/ B carrying with no c/o neck pain.    Baseline Addressed on 4/9 with cues for proper technique   Time 4   Period Weeks   Status On-going     PT LONG TERM GOAL #4   Title Pt. able to sleep in bed with no increase c/o pain or cervical brace to improve  pain-free mobility.    Baseline pain limited in bed/ sleeping without use of neck brace in sidesleeping position.    Time 4   Period Weeks   Status On-going     PT LONG TERM GOAL #5  Title Pt. able to return to work with no neck pain or limitations to improve return to PLOF.     Baseline pt. is not working at this time, is to return to work the week of 4/16   Time 4   Period Weeks   Status On-going             Plan - 12/29/16 1053    Rehab Potential Good   Clinical Impairments Affecting Rehab Potential previous back surgeries.    PT Frequency 1x / week   PT Duration 4 weeks   PT Treatment/Interventions ADLs/Self Care Home Management;Cryotherapy;Moist Heat;Balance training;Therapeutic exercise;Therapeutic activities;Functional mobility training;Stair training;Gait training;Neuromuscular re-education;Manual techniques;Passive range of motion;Scar mobilization;Patient/family education   PT Next Visit Plan Discuss return to work/ patient care.  CHECK GOALS   PT Home Exercise Plan See handouts   Consulted and Agree with Plan of Care Patient      Patient will benefit from skilled therapeutic intervention in order to improve the following deficits and impairments:  Decreased endurance, Improper body mechanics, Impaired flexibility, Decreased strength, Decreased mobility, Pain, Decreased range of motion, Hypomobility, Postural dysfunction  Visit Diagnosis: Neck pain  Joint stiffness  Muscle weakness (generalized)  Abnormal posture  Chronic midline low back pain without sciatica     Problem List Patient Active Problem List   Diagnosis Date Noted  . Cervical spondylosis with radiculopathy 10/06/2016  . Family history of breast cancer in mother 07/02/2015  . History of total vaginal hysterectomy 07/02/2015  . Lumbar stenosis 05/04/2015  . Spondylolisthesis of lumbar region 10/29/2014  . BP (high blood pressure) 03/13/2014  . HLD (hyperlipidemia) 03/13/2014  . Awareness  of heartbeats 03/13/2014  . Besnier-Boeck disease 03/13/2014  . Sarcoidosis 03/13/2014  . Airway hyperreactivity 02/15/2013   Pura Spice, PT, DPT # [id number] 12/29/2016, 10:55 AM  [facility] [address] [place], [place], [address] Phone: [phone]   Fax:  [phone]  Name: Megan Daniels MRN: [id number] Date of Birth: 1950-12-25

## 2017-01-04 ENCOUNTER — Ambulatory Visit: Payer: Medicare Other | Attending: Neurosurgery | Admitting: Physical Therapy

## 2017-01-04 DIAGNOSIS — M542 Cervicalgia: Secondary | ICD-10-CM | POA: Diagnosis present

## 2017-01-04 DIAGNOSIS — R293 Abnormal posture: Secondary | ICD-10-CM | POA: Diagnosis present

## 2017-01-04 DIAGNOSIS — M2560 Stiffness of unspecified joint, not elsewhere classified: Secondary | ICD-10-CM | POA: Insufficient documentation

## 2017-01-04 DIAGNOSIS — M6281 Muscle weakness (generalized): Secondary | ICD-10-CM | POA: Diagnosis present

## 2017-01-05 NOTE — Therapy (Signed)
[facility] [address] [place], [place], [address] Phone: [phone]   Fax:  [phone]  Physical Therapy Treatment  Patient Details  Name: Megan Daniels MRN: [id number] Date of Birth: July 12, 1951 Referring Provider: Dr. Arnoldo Morale  Encounter Date: 01/04/2017      PT End of Session - 01/05/17 1842    Visit Number 12   Number of Visits 12   Date for PT Re-Evaluation 01/09/17   Authorization - Visit Number 5   Authorization - Number of Visits 10   PT Start Time 1628   PT Stop Time 1725   PT Time Calculation (min) 57 min   Activity Tolerance Patient tolerated treatment well;Patient limited by pain   Behavior During Therapy [facility] for tasks assessed/performed      Past Medical History:  Diagnosis Date  . Abnormal Pap smear of cervix    pos hpv-   . Acid reflux   . Arthritis   . Asthma    Symbicort as needed  . Cancer ([place])    skin  . Chalazion of left eye   . Chronic back pain    spondylolisthesis/stenosis  . Chronic UTI    was treated for 3 months with Macrobid and in Dec everything was fine  . Constipation   . COPD (chronic obstructive pulmonary disease) ([place])   . Diverticulosis   . Elevated serum creatinine    with nsaids  . Foot drop    right  . Genital warts    h/o  . GERD (gastroesophageal reflux disease)    takes Prevacid daily  . Headache   . History of bronchitis   . History of colon polyps   . History of hiatal hernia   . Hypertension    takes Losartan daily  . Joint pain   . Palpitations    takes Metoprolol daily  . Pneumonia    hx of-early 2000's  . PONV (postoperative nausea and vomiting)    extreme nausea and vomiting -none last surgery 10/29/14  . Sarcoidosis   . Seasonal allergies    takes Claritin daily  . Shortness of breath dyspnea   . Sleep apnea    cpap 10 yrs  . Stress incontinence   . URI (upper respiratory infection)    treated self with Mucinex and no fever  . Urinary  urgency   . Vitamin D deficiency   . Weakness    numbness and tingling in both legs    Past Surgical History:  Procedure Laterality Date  . ABDOMINAL HYSTERECTOMY    . ANTERIOR CERVICAL DECOMP/DISCECTOMY FUSION N/A 10/06/2016   Procedure: ANTERIOR CERVICAL DECOMPRESSION/DISCECTOMY FUSION , INTERBODY PROSTHESIS, PLATE CERVICAL FOUR CERVICAL FIVE, CERVICAL SIX- CERVICAL SEVEN;  Surgeon: Newman Pies, MD;  Location: [place];  Service: Neurosurgery;  Laterality: N/A;  anterior  . BACK SURGERY    . BREAST BIOPSY Right 1996   EXCISIONAL - NEG  . BUNIONECTOMY Bilateral   . CARPAL TUNNEL RELEASE Right    x 2  . cataract surgery Bilateral   . CERVICAL FUSION    . COLONOSCOPY    . ESOPHAGOGASTRODUODENOSCOPY    . EXPLORATORY LAPAROTOMY    . left middle finger surgery     pin- arthritic cyst  . LUMBAR LAMINECTOMY/DECOMPRESSION MICRODISCECTOMY Right 05/04/2015   Procedure: RIGHT LUMBAR TWO-THREE/THREE-FOUR LAMINECTOMY/DECOMPRESSION ;  Surgeon: Newman Pies, MD;  Location: [place] NEURO [facility]S;  Service: Neurosurgery;  Laterality: Right;  Right L23 L34 laminectomy and foraminotomy  . Nissan  fundoplication    . TONSILLECTOMY      There were no vitals filed for this visit.      Subjective Assessment - 01/05/17 1841    Pertinent History see past PT notes and MD surgical report    Diagnostic tests XRAYS   Currently in Pain? Yes   Pain Score 2    Pain Location Neck   Pain Orientation Left;Posterior   Pain Descriptors / Indicators Aching   Pain Type Chronic pain   Pain Onset More than a month ago                                      PT Long Term Goals - 01/05/17 1843      PT LONG TERM GOAL #1   Title Pt. will decrease NDI to <30% to improve functional mobility/ return to work.     Baseline NDI 52% self-perceived severe disability on 11/16/16; on 12/12/16 32%   Time 4   Period Weeks   Status On-going     PT LONG TERM GOAL #2   Title Pt. independent with HEP to  increase B UE muscle strength 1/2 muscle grade to improve pain-free mobility.     Baseline For measurements on 4/9 see treatment note; Cervical AROM: R rotn. (45 deg.), L rotn. (30 deg.)- pain limited.  R lat. flex. (15 deg.), L lat. flex. (16 deg.).  Flexion (30 deg.), ext. (16 deg.).  B UE AROM WFL with L shoulder abd. limited to 130 deg. with RTC discomfort during eccentric muscle control.  L UE grossly 4+/5 MMT.  R UE grossly 4+/5 MMT except tricep 4/5 MMT.  R grip 23# and L grip 20#   Period Weeks   Status Partially Met     PT LONG TERM GOAL #3   Title Pt. able to demonstrate proper lifting technique/ B carrying with no c/o neck pain.    Baseline Good technique with box lifting.    Time 4   Period Weeks   Status Achieved     PT LONG TERM GOAL #4   Title Pt. able to sleep in bed with no increase c/o pain or cervical brace to improve pain-free mobility.    Baseline No neck pain.  Sleeping better.    Time 4   Period Weeks   Status Achieved     PT LONG TERM GOAL #5   Title Pt. able to return to work with no neck pain or limitations to improve return to PLOF.     Baseline Pt. working with lifting limitations.     Time 4   Period Weeks   Status Partially Met               Plan - 01/05/17 1843    Rehab Potential Good   Clinical Impairments Affecting Rehab Potential previous back surgeries.    PT Frequency 1x / week   PT Duration 4 weeks   PT Treatment/Interventions ADLs/Self Care Home Management;Cryotherapy;Moist Heat;Balance training;Therapeutic exercise;Therapeutic activities;Functional mobility training;Stair training;Gait training;Neuromuscular re-education;Manual techniques;Passive range of motion;Scar mobilization;Patient/family education   PT Next Visit Plan Discharge   PT Home Exercise Plan See handouts   Consulted and Agree with Plan of Care Patient      Patient will benefit from skilled therapeutic intervention in order to improve the following deficits and  impairments:  Decreased endurance, Improper body mechanics, Impaired flexibility, Decreased strength, Decreased mobility, Pain,  Decreased range of motion, Hypomobility, Postural dysfunction  Visit Diagnosis: Neck pain  Joint stiffness  Muscle weakness (generalized)  Abnormal posture       G-Codes - Jan 13, 2017 1845    Functional Assessment Tool Used (Outpatient Only) NDI, ROM, functional activities, strength, clinical judgement   Functional Limitation Changing and maintaining body position   Changing and Maintaining Body Position Current Status (Q7998) At least 20 percent but less than 40 percent impaired, limited or restricted   Changing and Maintaining Body Position Goal Status (X2158) At least 1 percent but less than 20 percent impaired, limited or restricted   Changing and Maintaining Body Position Discharge Status (N2761) At least 20 percent but less than 40 percent impaired, limited or restricted      Problem List Patient Active Problem List   Diagnosis Date Noted  . Cervical spondylosis with radiculopathy 10/06/2016  . Family history of breast cancer in mother 07/02/2015  . History of total vaginal hysterectomy 07/02/2015  . Lumbar stenosis 05/04/2015  . Spondylolisthesis of lumbar region 10/29/2014  . BP (high blood pressure) 03/13/2014  . HLD (hyperlipidemia) 03/13/2014  . Awareness of heartbeats 03/13/2014  . Besnier-Boeck disease 03/13/2014  . Sarcoidosis 03/13/2014  . Airway hyperreactivity 02/15/2013    Pura Spice 01/05/2017, 6:46 PM  [facility] [address] [place], [place], [address] Phone: [phone]   Fax:  [phone]  Name: Megan Daniels MRN: [id number] Date of Birth: 04-12-1951

## 2017-01-15 IMAGING — CR DG MYELOGRAPHY LUMBAR INJ LUMBOSACRAL
2 of 5 series · 6 of 17 positions shown · non-contrast
Comparison: MRI lumbar spine 03/18/15

CLINICAL DATA: Low back pain.  RIGHT leg pain.  Previous fusion.
TECHNIQUE: Contiguous axial images were obtained through the Lumbar spine after
the intrathecal infusion of infusion. Coronal and sagittal
reconstructions were obtained of the axial image sets.

[w lumbar spine ap]
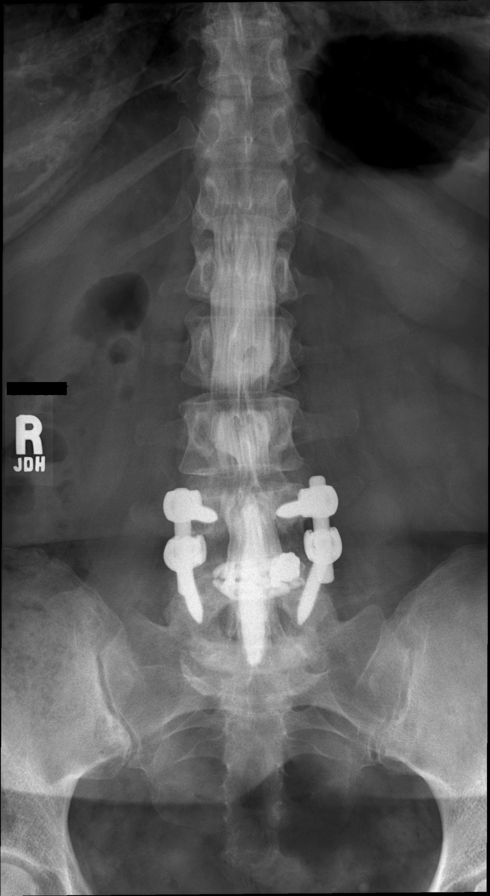

[Series 1: vasc standard · 5 of 13 slices shown]
[im 1/13]
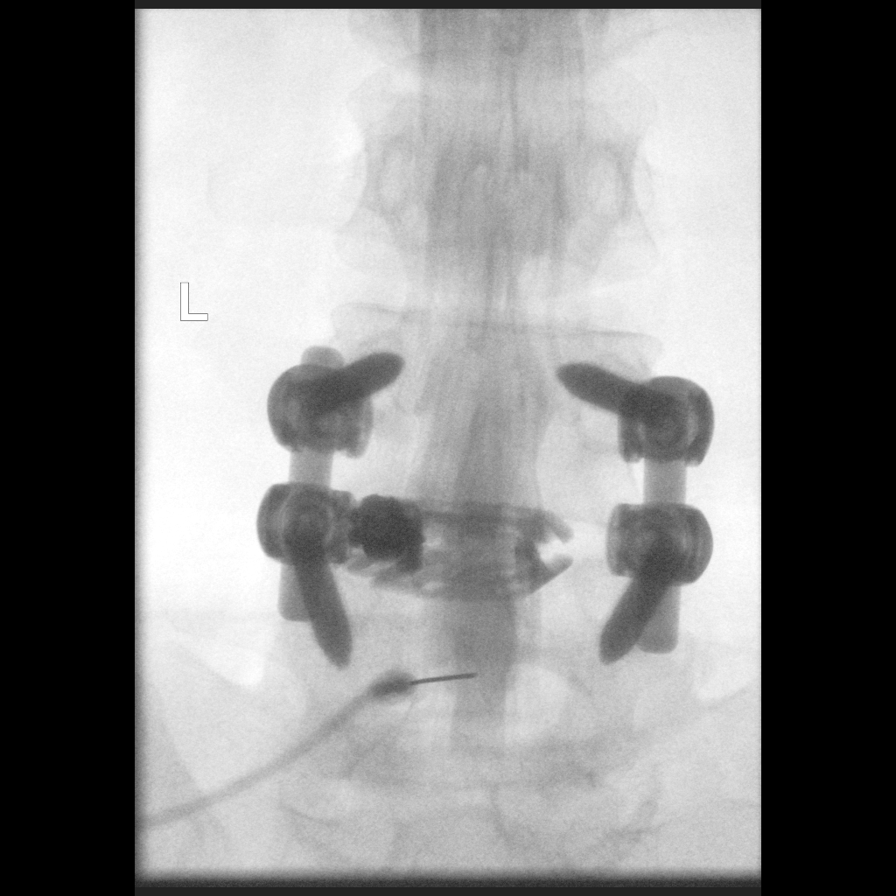
[im 2/13]
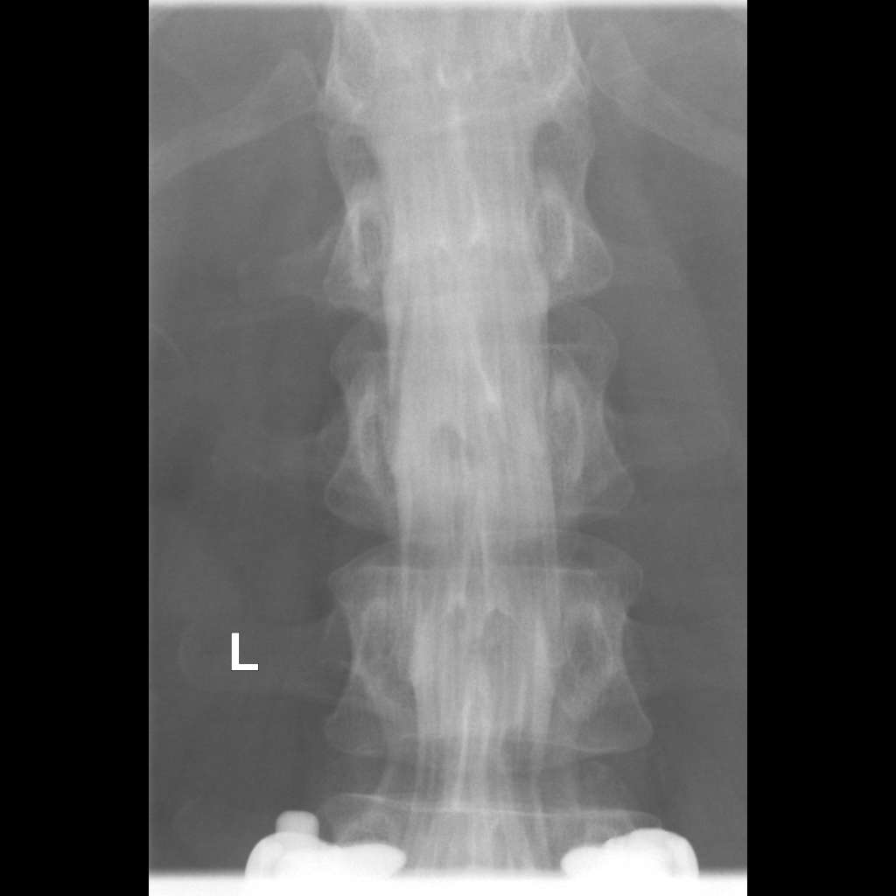
[im 3/13]
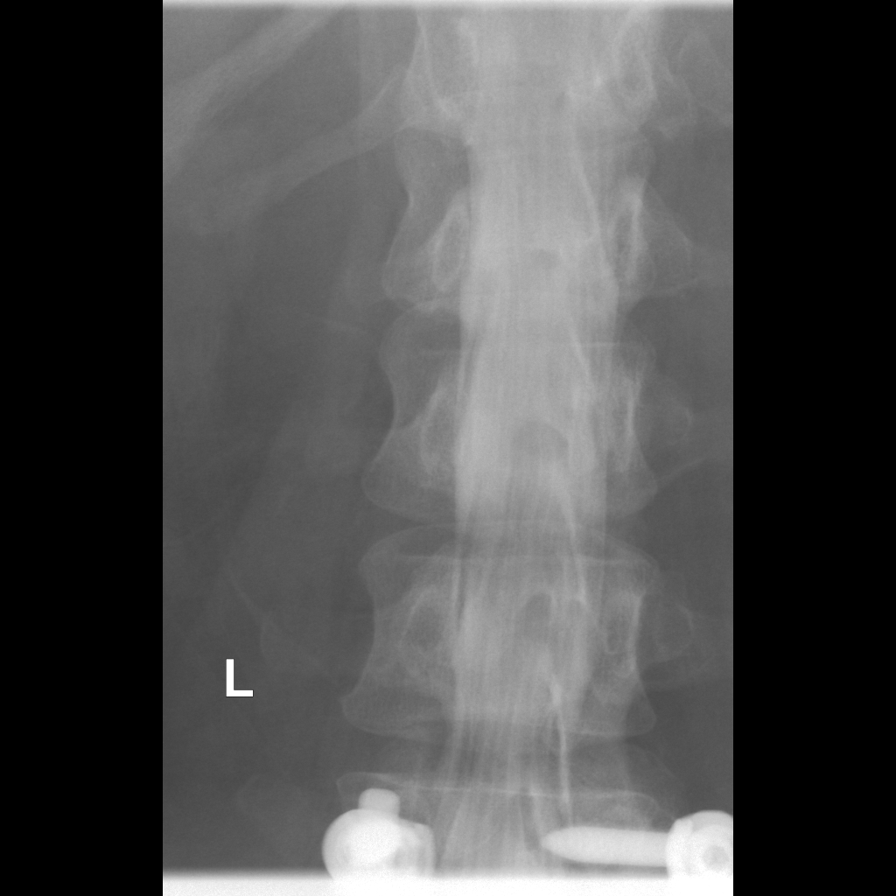
[im 4/13]
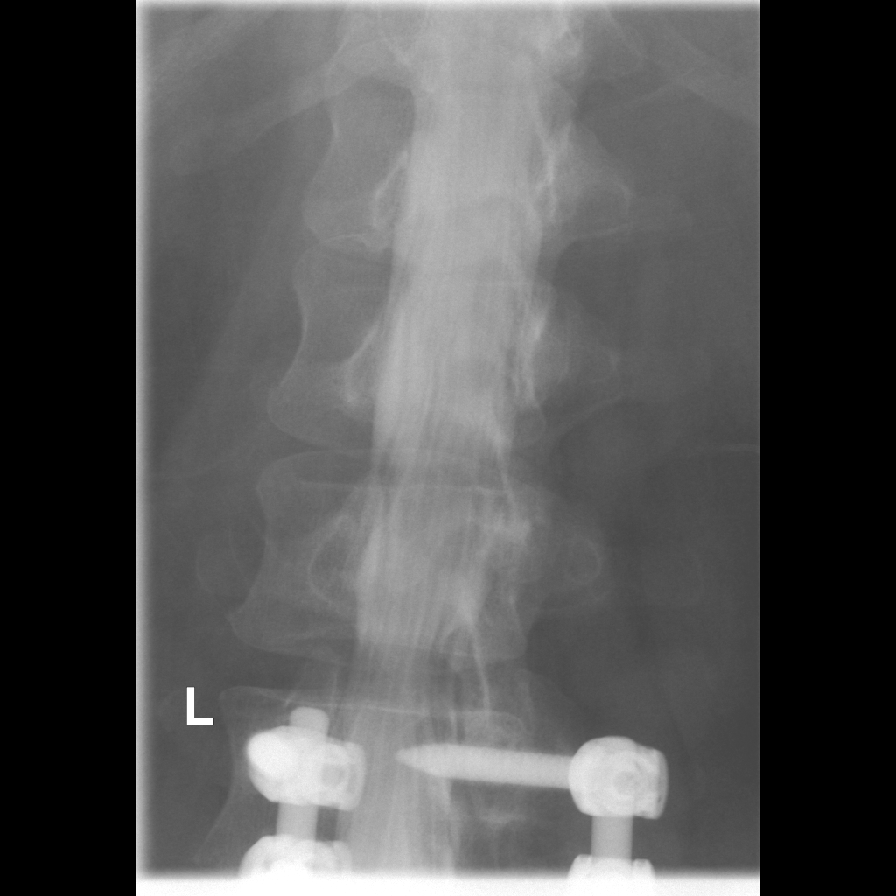
[im 5/13]
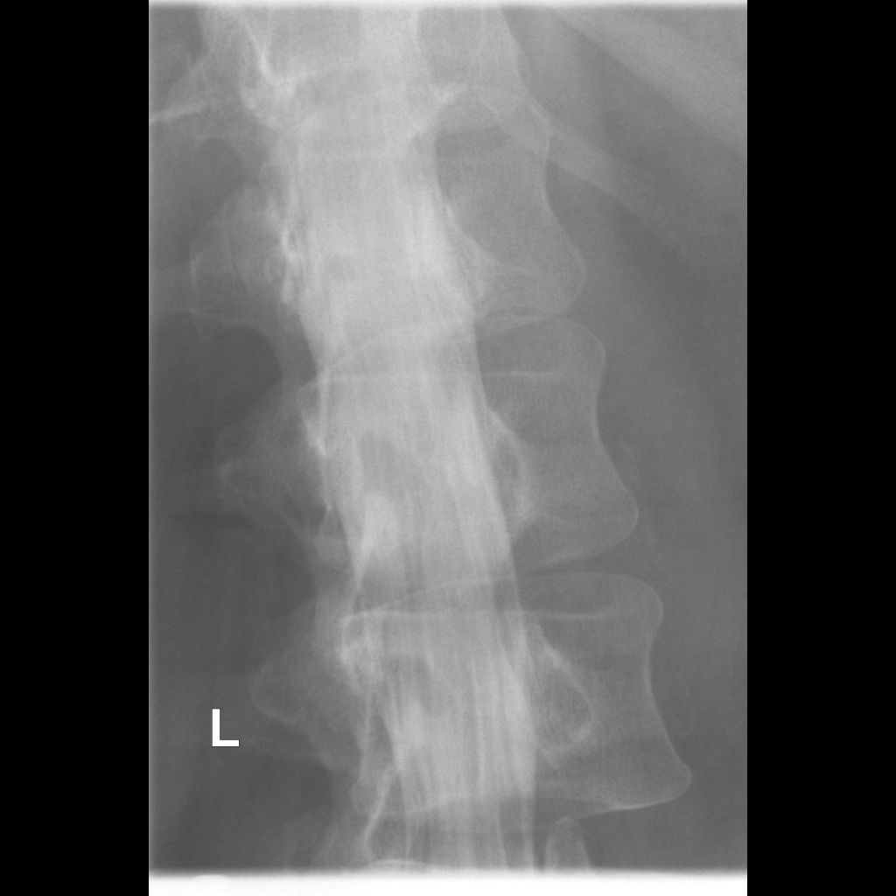

[6 of 17 positions shown; findings below may reference images not displayed]

EXAM:
LUMBAR MYELOGRAM

FLUOROSCOPY TIME:  53 seconds. corresponding to a Dose Area Product
of 317 ?Gy*m2

PROCEDURE:
After thorough discussion of risks and benefits of the procedure
including bleeding, infection, injury to nerves, blood vessels,
adjacent structures as well as headache and CSF leak, written and
oral informed consent was obtained. Consent was obtained by Dr. Voveriukas
Moatshe. Time out form was completed.

Patient was positioned prone on the fluoroscopy table. Local
anesthesia was provided with 1% lidocaine without epinephrine after
prepped and draped in the usual sterile fashion. Puncture was
performed at L5-S1 using a 3 1/2 inch 22-gauge spinal needle via
midline approach. Using a single pass through the dura, the needle
was placed within the thecal sac, with return of clear CSF. 15 mL of
7mnipaque-FPO was injected into the thecal sac, with normal
opacification of the nerve roots and cauda equina consistent with
free flow within the subarachnoid space.

I personally performed the lumbar puncture and administered the
intrathecal contrast. I also personally supervised acquisition of
the myelogram images.
FINDINGS: LUMBAR MYELOGRAM FINDINGS:

Good opacification lumbar subarachnoid space. Previous lumbar fusion
L4-5 with posterior pedicle screw and interbody cages. There is
trace anterolisthesis at this level but overall alignment has been
restored.

Slight retrolisthesis L5-S1 measuring 2-3 mm. Trace retrolisthesis
L3-4 and L2-3.

Mild stenosis L3-4 with patient recumbent for myelography is much
worse with patient standing. In the upright AP physician there is
severe compression of the thecal sac and RIGHT greater than LEFT L4
nerve roots. At L2-3, similar severe stenosis develops with patient
upright with subtotal block and BILATERAL L3 nerve root impingement.

Upright standing flexion extension films demonstrate moderate-sized
extradural defect at L2-3 ventrally representing disc protrusion,
contributing to stenosis, associated with hypertrophy of the
ligamentum flava. This canal stenosis is moderate with patient in
neutral, severe with the patient in extension, and improved in
flexion.

At L3-4, with patient standing, there is a mild ventral defect
representing annular bulging. Ligamentum flavum hypertrophy is
prominent. Stenosis on the AP view is moderate with patient in
neutral, severe with patient in extension, and improved in flexion.

CT LUMBAR MYELOGRAM FINDINGS:

Segmentation: Normal.

Alignment:  Normal.

Vertebrae: No worrisome osseous lesion.

Conus medullaris: Normal in size, and location.

Paraspinal tissues: No evidence for hydronephrosis or paravertebral
mass.

Disc levels:

L1-L2:  Normal.

L2-L3: Central protrusion. Mild to moderate facet ligamentum flavum
hypertrophy. Moderate to severe spinal stenosis. BILATERAL L3 nerve
root effacement. Slight foraminal narrowing without definite L2
nerve root compromise

L3-L4: Central protrusion. Mild facet arthropathy. Moderate RIGHT
greater than LEFT ligamentum flavum hypertrophy. Moderate to severe
spinal stenosis. RIGHT greater than LEFT L4 nerve root impingement.
Slight foraminal narrowing without definite L3 nerve root
compromise.

L4-L5: Post fusion interspace. Adequate decompression. Hardware
intact. No screw loosening. Slight subsidence of the interbody cages
into L5. No solid interbody bridging.

L5-S1: Disc space narrowing. Trace retrolisthesis. Vacuum
phenomenon. Mild facet arthropathy.Broad-based disc protrusion. No
S1 nerve root impingement in the canal. BILATERAL foraminal
narrowing is worse on the LEFT, and could affect the L5 nerve root.
IMPRESSION: LUMBAR MYELOGRAM IMPRESSION:

Previous posterior and interbody fusion at L4-5 with adequate
decompression at the surgical site.

Significant adjacent segment disease at L2-3 and L3-4, related to
disc material and posterior element hypertrophy, develops with
patient upright. RIGHT greater than LEFT L4 nerve root impingement
at the L3-4 level appears most likely to contribute to RIGHT leg
pain.

CT LUMBAR MYELOGRAM IMPRESSION:

Moderate to severe spinal stenosis at L2-3 and L3-4.

RIGHT greater than LEFT L4 nerve root impingement at the L3-4 level
is related to asymmetric ligamentum flavum hypertrophy.

Central protrusion at L2-3 contributes most to stenosis.

No solid interbody bridging at L4-5. Slight subsidence of the
interbody cages without screw loosening.

BILATERAL foraminal narrowing at L5-S1, potentially affecting the
LEFT L5 nerve root.

## 2017-01-15 IMAGING — CT CT L SPINE W/ CM
1 of 8 series · 3 of 14 positions shown, 4 images · non-contrast
Comparison: MRI lumbar spine 03/18/15

CLINICAL DATA: Low back pain.  RIGHT leg pain.  Previous fusion.
TECHNIQUE: Contiguous axial images were obtained through the Lumbar spine after
the intrathecal infusion of infusion. Coronal and sagittal
reconstructions were obtained of the axial image sets.

[Series 10: l spine bone · axial · 0.27mm/px · z∈[+722,+860]mm · 3 of 93 slices shown, 4 images]
[im 24/93  soft-tissue]
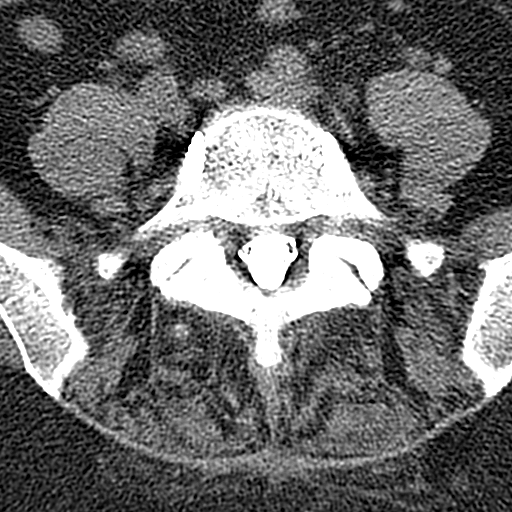
[im 24/93  bone]
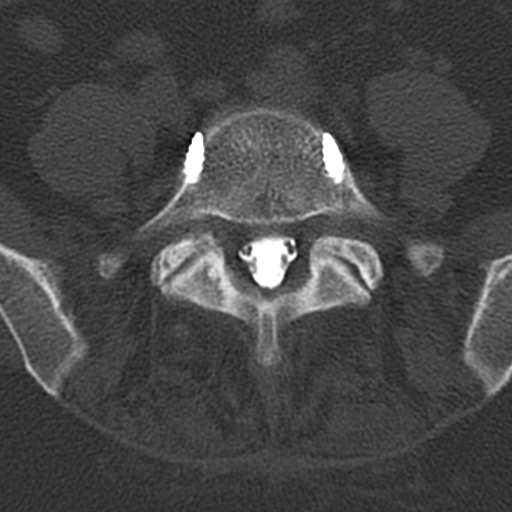
[im 47/93  bone]
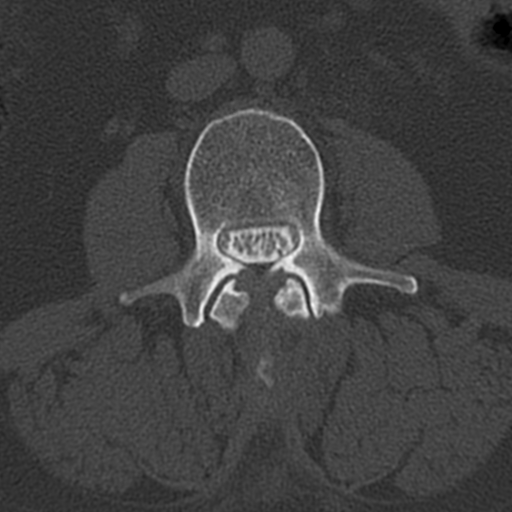
[im 70/93  bone]
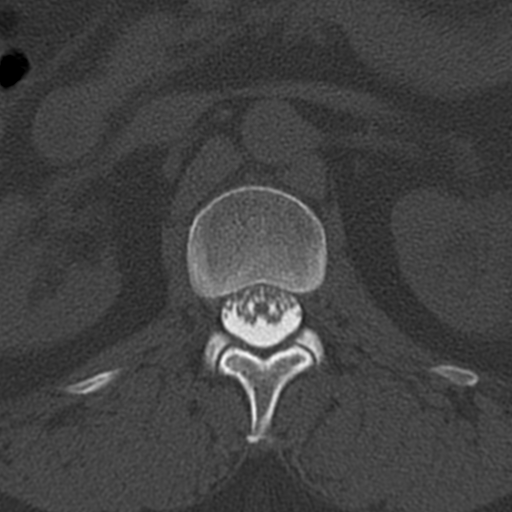

[3 of 14 positions shown; findings below may reference images not displayed]

EXAM:
LUMBAR MYELOGRAM

FLUOROSCOPY TIME:  53 seconds. corresponding to a Dose Area Product
of 317 ?Gy*m2

PROCEDURE:
After thorough discussion of risks and benefits of the procedure
including bleeding, infection, injury to nerves, blood vessels,
adjacent structures as well as headache and CSF leak, written and
oral informed consent was obtained. Consent was obtained by Dr. Voveriukas
Moatshe. Time out form was completed.

Patient was positioned prone on the fluoroscopy table. Local
anesthesia was provided with 1% lidocaine without epinephrine after
prepped and draped in the usual sterile fashion. Puncture was
performed at L5-S1 using a 3 1/2 inch 22-gauge spinal needle via
midline approach. Using a single pass through the dura, the needle
was placed within the thecal sac, with return of clear CSF. 15 mL of
7mnipaque-FPO was injected into the thecal sac, with normal
opacification of the nerve roots and cauda equina consistent with
free flow within the subarachnoid space.

I personally performed the lumbar puncture and administered the
intrathecal contrast. I also personally supervised acquisition of
the myelogram images.
FINDINGS: LUMBAR MYELOGRAM FINDINGS:

Good opacification lumbar subarachnoid space. Previous lumbar fusion
L4-5 with posterior pedicle screw and interbody cages. There is
trace anterolisthesis at this level but overall alignment has been
restored.

Slight retrolisthesis L5-S1 measuring 2-3 mm. Trace retrolisthesis
L3-4 and L2-3.

Mild stenosis L3-4 with patient recumbent for myelography is much
worse with patient standing. In the upright AP physician there is
severe compression of the thecal sac and RIGHT greater than LEFT L4
nerve roots. At L2-3, similar severe stenosis develops with patient
upright with subtotal block and BILATERAL L3 nerve root impingement.

Upright standing flexion extension films demonstrate moderate-sized
extradural defect at L2-3 ventrally representing disc protrusion,
contributing to stenosis, associated with hypertrophy of the
ligamentum flava. This canal stenosis is moderate with patient in
neutral, severe with the patient in extension, and improved in
flexion.

At L3-4, with patient standing, there is a mild ventral defect
representing annular bulging. Ligamentum flavum hypertrophy is
prominent. Stenosis on the AP view is moderate with patient in
neutral, severe with patient in extension, and improved in flexion.

CT LUMBAR MYELOGRAM FINDINGS:

Segmentation: Normal.

Alignment:  Normal.

Vertebrae: No worrisome osseous lesion.

Conus medullaris: Normal in size, and location.

Paraspinal tissues: No evidence for hydronephrosis or paravertebral
mass.

Disc levels:

L1-L2:  Normal.

L2-L3: Central protrusion. Mild to moderate facet ligamentum flavum
hypertrophy. Moderate to severe spinal stenosis. BILATERAL L3 nerve
root effacement. Slight foraminal narrowing without definite L2
nerve root compromise

L3-L4: Central protrusion. Mild facet arthropathy. Moderate RIGHT
greater than LEFT ligamentum flavum hypertrophy. Moderate to severe
spinal stenosis. RIGHT greater than LEFT L4 nerve root impingement.
Slight foraminal narrowing without definite L3 nerve root
compromise.

L4-L5: Post fusion interspace. Adequate decompression. Hardware
intact. No screw loosening. Slight subsidence of the interbody cages
into L5. No solid interbody bridging.

L5-S1: Disc space narrowing. Trace retrolisthesis. Vacuum
phenomenon. Mild facet arthropathy.Broad-based disc protrusion. No
S1 nerve root impingement in the canal. BILATERAL foraminal
narrowing is worse on the LEFT, and could affect the L5 nerve root.
IMPRESSION: LUMBAR MYELOGRAM IMPRESSION:

Previous posterior and interbody fusion at L4-5 with adequate
decompression at the surgical site.

Significant adjacent segment disease at L2-3 and L3-4, related to
disc material and posterior element hypertrophy, develops with
patient upright. RIGHT greater than LEFT L4 nerve root impingement
at the L3-4 level appears most likely to contribute to RIGHT leg
pain.

CT LUMBAR MYELOGRAM IMPRESSION:

Moderate to severe spinal stenosis at L2-3 and L3-4.

RIGHT greater than LEFT L4 nerve root impingement at the L3-4 level
is related to asymmetric ligamentum flavum hypertrophy.

Central protrusion at L2-3 contributes most to stenosis.

No solid interbody bridging at L4-5. Slight subsidence of the
interbody cages without screw loosening.

BILATERAL foraminal narrowing at L5-S1, potentially affecting the
LEFT L5 nerve root.

## 2017-03-17 ENCOUNTER — Other Ambulatory Visit: Payer: Self-pay | Admitting: Neurosurgery

## 2017-03-17 DIAGNOSIS — M4712 Other spondylosis with myelopathy, cervical region: Secondary | ICD-10-CM

## 2017-03-28 ENCOUNTER — Ambulatory Visit
Admission: RE | Admit: 2017-03-28 | Discharge: 2017-03-28 | Disposition: A | Discharge status: Home / Self Care | Payer: Medicare Other | Source: Ambulatory Visit | Attending: Neurosurgery | Admitting: Neurosurgery

## 2017-03-28 DIAGNOSIS — M4712 Other spondylosis with myelopathy, cervical region: Secondary | ICD-10-CM | POA: Diagnosis present

## 2017-03-28 DIAGNOSIS — M4802 Spinal stenosis, cervical region: Secondary | ICD-10-CM | POA: Diagnosis not present

## 2017-06-19 ENCOUNTER — Other Ambulatory Visit: Payer: Self-pay | Admitting: Internal Medicine

## 2017-06-19 DIAGNOSIS — Z1231 Encounter for screening mammogram for malignant neoplasm of breast: Secondary | ICD-10-CM

## 2017-06-20 ENCOUNTER — Encounter: Payer: Self-pay | Admitting: *Deleted

## 2017-06-21 ENCOUNTER — Encounter: Payer: Self-pay | Admitting: Anesthesiology

## 2017-06-21 ENCOUNTER — Ambulatory Visit
Admission: RE | Admit: 2017-06-21 | Discharge: 2017-06-21 | Disposition: A | Discharge status: Home / Self Care | Payer: Medicare Other | Source: Ambulatory Visit | Attending: Unknown Physician Specialty | Admitting: Unknown Physician Specialty

## 2017-06-21 ENCOUNTER — Encounter
Admission: RE | Disposition: A | Discharge status: Home / Self Care | Payer: Self-pay | Source: Ambulatory Visit | Attending: Unknown Physician Specialty

## 2017-06-21 ENCOUNTER — Ambulatory Visit: Payer: Medicare Other | Admitting: Anesthesiology

## 2017-06-21 DIAGNOSIS — K5730 Diverticulosis of large intestine without perforation or abscess without bleeding: Secondary | ICD-10-CM | POA: Insufficient documentation

## 2017-06-21 DIAGNOSIS — Z888 Allergy status to other drugs, medicaments and biological substances status: Secondary | ICD-10-CM | POA: Diagnosis not present

## 2017-06-21 DIAGNOSIS — Z09 Encounter for follow-up examination after completed treatment for conditions other than malignant neoplasm: Secondary | ICD-10-CM | POA: Diagnosis not present

## 2017-06-21 DIAGNOSIS — D869 Sarcoidosis, unspecified: Secondary | ICD-10-CM | POA: Insufficient documentation

## 2017-06-21 DIAGNOSIS — G4730 Sleep apnea, unspecified: Secondary | ICD-10-CM | POA: Diagnosis not present

## 2017-06-21 DIAGNOSIS — M1990 Unspecified osteoarthritis, unspecified site: Secondary | ICD-10-CM | POA: Insufficient documentation

## 2017-06-21 DIAGNOSIS — I10 Essential (primary) hypertension: Secondary | ICD-10-CM | POA: Insufficient documentation

## 2017-06-21 DIAGNOSIS — K219 Gastro-esophageal reflux disease without esophagitis: Secondary | ICD-10-CM | POA: Insufficient documentation

## 2017-06-21 DIAGNOSIS — Z79899 Other long term (current) drug therapy: Secondary | ICD-10-CM | POA: Insufficient documentation

## 2017-06-21 DIAGNOSIS — Z7982 Long term (current) use of aspirin: Secondary | ICD-10-CM | POA: Diagnosis not present

## 2017-06-21 DIAGNOSIS — G8929 Other chronic pain: Secondary | ICD-10-CM | POA: Diagnosis not present

## 2017-06-21 DIAGNOSIS — Z8719 Personal history of other diseases of the digestive system: Secondary | ICD-10-CM | POA: Insufficient documentation

## 2017-06-21 DIAGNOSIS — Z791 Long term (current) use of non-steroidal anti-inflammatories (NSAID): Secondary | ICD-10-CM | POA: Insufficient documentation

## 2017-06-21 DIAGNOSIS — J449 Chronic obstructive pulmonary disease, unspecified: Secondary | ICD-10-CM | POA: Insufficient documentation

## 2017-06-21 DIAGNOSIS — K641 Second degree hemorrhoids: Secondary | ICD-10-CM | POA: Insufficient documentation

## 2017-06-21 HISTORY — PX: COLONOSCOPY WITH PROPOFOL: SHX5780

## 2017-06-21 SURGERY — COLONOSCOPY WITH PROPOFOL
Anesthesia: General

## 2017-06-21 MED ORDER — LIDOCAINE HCL (PF) 2 % IJ SOLN
INTRAMUSCULAR | Status: AC
  Filled 2017-06-21: qty 10

## 2017-06-21 MED ORDER — LIDOCAINE HCL (PF) 1 % IJ SOLN
2.0000 mL | Freq: Once | INTRAMUSCULAR | Status: AC
  Administered 2017-06-21: 0.3 mL via INTRADERMAL

## 2017-06-21 MED ORDER — LIDOCAINE HCL (PF) 1 % IJ SOLN
INTRAMUSCULAR | Status: AC
  Administered 2017-06-21: 0.3 mL via INTRADERMAL
  Filled 2017-06-21: qty 2

## 2017-06-21 MED ORDER — SODIUM CHLORIDE 0.9 % IV SOLN
INTRAVENOUS | Status: DC
  Administered 2017-06-21: 1000 mL via INTRAVENOUS

## 2017-06-21 MED ORDER — SODIUM CHLORIDE 0.9 % IV SOLN: INTRAVENOUS | Status: DC

## 2017-06-21 MED ORDER — PROPOFOL 500 MG/50ML IV EMUL
INTRAVENOUS | Status: AC
  Filled 2017-06-21: qty 50

## 2017-06-21 MED ORDER — LIDOCAINE HCL (CARDIAC) 20 MG/ML IV SOLN
INTRAVENOUS | Status: DC | PRN
  Administered 2017-06-21: 100 mg via INTRAVENOUS

## 2017-06-21 MED ORDER — PROPOFOL 500 MG/50ML IV EMUL
INTRAVENOUS | Status: DC | PRN
  Administered 2017-06-21: 150 ug/kg/min via INTRAVENOUS

## 2017-06-21 MED ORDER — PROPOFOL 10 MG/ML IV BOLUS
INTRAVENOUS | Status: DC | PRN
  Administered 2017-06-21: 80 mg via INTRAVENOUS

## 2017-06-21 NOTE — Anesthesia Postprocedure Evaluation (Signed)
Anesthesia Post Note  Patient: Megan Daniels  Procedure(s) Performed: COLONOSCOPY WITH PROPOFOL (N/A )  Patient location during evaluation: Endoscopy Anesthesia Type: General Level of consciousness: awake and alert Pain management: pain level controlled Vital Signs Assessment: post-procedure vital signs reviewed and stable Respiratory status: spontaneous breathing, nonlabored ventilation, respiratory function stable and patient connected to nasal cannula oxygen Cardiovascular status: blood pressure returned to baseline and stable Postop Assessment: no apparent nausea or vomiting Anesthetic complications: no     Last Vitals:  Vitals:   06/21/17 1040 06/21/17 1050  BP: (!) 144/85 (!) 160/83  Pulse: 60 (!) 58  Resp: 15 19  Temp:    SpO2: 96% 100%    Last Pain:  Vitals:   06/21/17 1020  TempSrc: Tympanic                 Martha Clan

## 2017-06-21 NOTE — Anesthesia Preprocedure Evaluation (Signed)
Anesthesia Evaluation  Patient identified by MRN, date of birth, ID band Patient awake    Reviewed: Allergy & Precautions, H&P , NPO status , Patient's Chart, lab work & pertinent test results, reviewed documented beta blocker date and time   History of Anesthesia Complications (+) PONV and history of anesthetic complications  Airway Mallampati: II  TM Distance: >3 FB Neck ROM: full    Dental  (+) Caps, Dental Advidsory Given, Teeth Intact   Pulmonary shortness of breath and with exertion, asthma , sleep apnea , COPD,  COPD inhaler, neg recent URI,           Cardiovascular Exercise Tolerance: Good hypertension, (-) angina(-) CAD, (-) Past MI, (-) Cardiac Stents and (-) CABG (-) dysrhythmias (-) Valvular Problems/Murmurs     Neuro/Psych negative neurological ROS  negative psych ROS   GI/Hepatic Neg liver ROS, hiatal hernia, GERD  ,  Endo/Other  negative endocrine ROS  Renal/GU CRFRenal disease  negative genitourinary   Musculoskeletal   Abdominal   Peds  Hematology negative hematology ROS (+)   Anesthesia Other Findings Past Medical History: No date: Abnormal Pap smear of cervix     Comment:  pos hpv-  No date: Acid reflux No date: Arthritis No date: Asthma     Comment:  Symbicort as needed No date: Cancer ([place])     Comment:  skin No date: Chalazion of left eye No date: Chronic back pain     Comment:  spondylolisthesis/stenosis No date: Chronic UTI     Comment:  was treated for 3 months with Macrobid and in Dec               everything was fine No date: Constipation No date: COPD (chronic obstructive pulmonary disease) ([facility]) No date: Diverticulosis No date: Elevated serum creatinine     Comment:  with nsaids No date: Foot drop     Comment:  right No date: Genital warts     Comment:  h/o No date: GERD (gastroesophageal reflux disease)     Comment:  takes Prevacid daily No date: Headache No date:  History of bronchitis No date: History of colon polyps No date: History of hiatal hernia No date: Hypertension     Comment:  takes Losartan daily No date: Joint pain No date: Palpitations     Comment:  takes Metoprolol daily No date: Pneumonia     Comment:  hx of-early 2000's No date: PONV (postoperative nausea and vomiting)     Comment:  extreme nausea and vomiting -none last surgery 10/29/14 No date: Sarcoidosis No date: Seasonal allergies     Comment:  takes Claritin daily No date: Shortness of breath dyspnea No date: Sleep apnea     Comment:  cpap 10 yrs No date: Stress incontinence No date: URI (upper respiratory infection)     Comment:  treated self with Mucinex and no fever No date: Urinary urgency No date: Vitamin D deficiency No date: Weakness     Comment:  numbness and tingling in both legs   Reproductive/Obstetrics negative OB ROS                             Anesthesia Physical Anesthesia Plan  ASA: III  Anesthesia Plan: General   Post-op Pain Management:    Induction: Intravenous  PONV Risk Score and Plan: 4 or greater and Propofol infusion  Airway Management Planned: Nasal Cannula  Additional Equipment:   Intra-op Plan:  Post-operative Plan:   Informed Consent: I have reviewed the patients History and Physical, chart, labs and discussed the procedure including the risks, benefits and alternatives for the proposed anesthesia with the patient or authorized representative who has indicated his/her understanding and acceptance.   Dental Advisory Given  Plan Discussed with: Anesthesiologist, CRNA and Surgeon  Anesthesia Plan Comments:         Anesthesia Quick Evaluation

## 2017-06-21 NOTE — H&P (Signed)
Primary Care Physician:  Idelle Crouch, MD Primary Gastroenterologist:  Dr. Vira Agar  Pre-Procedure History & Physical: HPI:  Megan Daniels is a 66 y.o. female is here for an colonoscopy.   Past Medical History:  Diagnosis Date  . Abnormal Pap smear of cervix    pos hpv-   . Acid reflux   . Arthritis   . Asthma    Symbicort as needed  . Cancer ([place])    skin  . Chalazion of left eye   . Chronic back pain    spondylolisthesis/stenosis  . Chronic UTI    was treated for 3 months with Macrobid and in Dec everything was fine  . Constipation   . COPD (chronic obstructive pulmonary disease) ([place])   . Diverticulosis   . Elevated serum creatinine    with nsaids  . Foot drop    right  . Genital warts    h/o  . GERD (gastroesophageal reflux disease)    takes Prevacid daily  . Headache   . History of bronchitis   . History of colon polyps   . History of hiatal hernia   . Hypertension    takes Losartan daily  . Joint pain   . Palpitations    takes Metoprolol daily  . Pneumonia    hx of-early 2000's  . PONV (postoperative nausea and vomiting)    extreme nausea and vomiting -none last surgery 10/29/14  . Sarcoidosis   . Seasonal allergies    takes Claritin daily  . Shortness of breath dyspnea   . Sleep apnea    cpap 10 yrs  . Stress incontinence   . URI (upper respiratory infection)    treated self with Mucinex and no fever  . Urinary urgency   . Vitamin D deficiency   . Weakness    numbness and tingling in both legs    Past Surgical History:  Procedure Laterality Date  . ABDOMINAL HYSTERECTOMY    . ANTERIOR CERVICAL DECOMP/DISCECTOMY FUSION N/A 10/06/2016   Procedure: ANTERIOR CERVICAL DECOMPRESSION/DISCECTOMY FUSION , INTERBODY PROSTHESIS, PLATE CERVICAL FOUR CERVICAL FIVE, CERVICAL SIX- CERVICAL SEVEN;  Surgeon: Newman Pies, MD;  Location: [facility];  Service: Neurosurgery;  Laterality: N/A;  anterior  . BACK SURGERY    . BREAST BIOPSY Right 1996    EXCISIONAL - NEG  . BUNIONECTOMY Bilateral   . CARPAL TUNNEL RELEASE Right    x 2  . cataract surgery Bilateral   . CERVICAL FUSION    . COLONOSCOPY    . ESOPHAGOGASTRODUODENOSCOPY    . EXPLORATORY LAPAROTOMY    . left middle finger surgery     pin- arthritic cyst  . LUMBAR LAMINECTOMY/DECOMPRESSION MICRODISCECTOMY Right 05/04/2015   Procedure: RIGHT LUMBAR TWO-THREE/THREE-FOUR LAMINECTOMY/DECOMPRESSION ;  Surgeon: Newman Pies, MD;  Location: [facility];  Service: Neurosurgery;  Laterality: Right;  Right L23 L34 laminectomy and foraminotomy  . Nissan fundoplication    . TONSILLECTOMY      Prior to Admission medications   Medication Sig Start Date End Date Taking? Authorizing Provider  acetaminophen (TYLENOL) 500 MG tablet Take 1,000 mg by mouth every 6 (six) hours as needed for moderate pain or headache.    [provider]  albuterol (PROAIR HFA) 108 (90 BASE) MCG/ACT inhaler Inhale 2 puffs into the lungs every 4 (four) hours as needed for wheezing or shortness of breath.     [provider]  aspirin EC 81 MG tablet Take 1 tablet (81 mg total) by mouth daily.  Patient taking differently: Take 81 mg by mouth every evening.  11/14/14   Consuella Lose, MD  Cholecalciferol (VITAMIN D) 2000 units CAPS Take 2,000 Units by mouth daily.     [provider]  docusate sodium (COLACE) 100 MG capsule Take 100 mg by mouth 2 (two) times daily.     [provider]  escitalopram (LEXAPRO) 10 MG tablet Take 10 mg by mouth daily.    [provider]  gabapentin (NEURONTIN) 300 MG capsule Take 300 mg by mouth at bedtime.     [provider]  HYDROmorphone (DILAUDID) 4 MG tablet Take 1 tablet (4 mg total) by mouth every 4 (four) hours as needed for severe pain. Patient not taking: Reported on 11/16/2016 10/07/16   Newman Pies, MD  Ipratropium-Albuterol (COMBIVENT RESPIMAT) 20-100 MCG/ACT AERS respimat Inhale 1 puff into the lungs every 6 (six)  hours as needed for wheezing.    [provider]  losartan (COZAAR) 100 MG tablet Take 100 mg by mouth at bedtime.     [provider]  Magnesium 400 MG TABS Take 400 mg by mouth at bedtime.     [provider]  metoprolol succinate (TOPROL-XL) 25 MG 24 hr tablet Take 25 mg by mouth 2 (two) times daily.     [provider]  Multiple Vitamin (MULTIVITAMIN WITH MINERALS) TABS tablet Take 1 tablet by mouth daily.    [provider]  pantoprazole (PROTONIX) 40 MG tablet Take 40 mg by mouth 2 (two) times daily.    [provider]  senna-docusate (SENOKOT-S) 8.6-50 MG tablet Take 1 tablet by mouth daily as needed for moderate constipation.     [provider]  traMADol (ULTRAM) 50 MG tablet Take 25 mg by mouth daily as needed for moderate pain.     [provider]  vitamin B-12 (CYANOCOBALAMIN) 500 MCG tablet Take 500 mcg by mouth daily.     [provider]  vitamin C (ASCORBIC ACID) 500 MG tablet Take 500 mg by mouth daily.     [provider]    Allergies as of 01/02/2017 - Review Complete 12/29/2016  Allergen Reaction Noted  . Erythromycin base Nausea Only 10/29/2014  . Hydrocodone Nausea Only 10/29/2014  . Oxycodone-acetaminophen Itching 04/03/2015  . Talwin [pentazocine] Nausea And Vomiting 10/29/2014  . Vicodin [hydrocodone-acetaminophen] Nausea And Vomiting 10/14/2014  . Vioxx [rofecoxib] Nausea And Vomiting 10/29/2014    Family History  Problem Relation Age of Onset  . Breast cancer Mother   . Breast cancer Maternal Aunt   . Diabetes Maternal Aunt   . Hypertension Father   . Diabetes Maternal Uncle   . Ovarian cancer Neg Hx   . Colon cancer Neg Hx     Social History   Social History  . Marital status: Married    Spouse name: N/A  . Number of children: N/A  . Years of education: N/A   Occupational History  . Not on file.   Social History Main Topics  . Smoking status: Never Smoker   . Smokeless tobacco: Never Used  . Alcohol use Yes     Comment: wine rarely  . Drug use: No  . Sexual activity: Not Currently    Birth control/ protection: Surgical   Other Topics Concern  . Not on file   Social History Narrative  . No narrative on file    Review of Systems: See HPI, otherwise negative ROS  Physical Exam: BP (!) 159/73   Pulse Marland Kitchen)  58   Temp (!) 97.2 F (36.2 C) (Tympanic)   Resp 17   Ht 5\' 3"  (1.6 m)   Wt 78.9 kg (174 lb)   SpO2 100%   BMI 30.82 kg/m  General:   Alert,  pleasant and cooperative in NAD Head:  Normocephalic and atraumatic. Neck:  Supple; no masses or thyromegaly. Lungs:  Clear throughout to auscultation.    Heart:  Regular rate and rhythm. Abdomen:  Soft, nontender and nondistended. Normal bowel sounds, without guarding, and without rebound.   Neurologic:  Alert and  oriented x4;  grossly normal neurologically.  Impression/Plan: PRAPTI GRUSSING is here for an colonoscopy to be performed for [facility] colon polyps and [facility] colon polyps in father.  Risks, benefits, limitations, and alternatives regarding  colonoscopy have been reviewed with the patient.  Questions have been answered.  All parties agreeable.   Gaylyn Cheers, MD  06/21/2017, 9:44 AM

## 2017-06-21 NOTE — Anesthesia Post-op Follow-up Note (Signed)
Anesthesia QCDR form completed.        

## 2017-06-21 NOTE — Op Note (Signed)
[facility] Gastroenterology Patient Name: Megan Daniels Procedure Date: 06/21/2017 9:58 AM MRN: [id number] Account #: [id number] Date of Birth: [birth year] Admit Type: Outpatient Age: 66 Room: [facility] ENDO ROOM 4 Gender: Female Note Status: Finalized Procedure:            Colonoscopy Indications:          High risk colon cancer surveillance: Personal history                        of colonic polyps Providers:            Manya Silvas, MD Referring MD:         Leonie Douglas. Doy Hutching, MD (Referring MD) Medicines:            Propofol per Anesthesia Complications:        No immediate complications. Procedure:            Pre-Anesthesia Assessment:                       - After reviewing the risks and benefits, the patient                        was deemed in satisfactory condition to undergo the                        procedure.                       After obtaining informed consent, the colonoscope was                        passed under direct vision. Throughout the procedure,                        the patient's blood pressure, pulse, and oxygen                        saturations were monitored continuously. The                        Colonoscope was introduced through the anus and                        advanced to the the cecum, identified by appendiceal                        orifice and ileocecal valve. The colonoscopy was                        performed without difficulty. The patient tolerated the                        procedure well. The quality of the bowel preparation                        was excellent. Findings:      A few small-mouthed diverticula were found in the sigmoid colon.      Internal hemorrhoids were found during endoscopy. The hemorrhoids were       medium-sized, large and Grade II (internal hemorrhoids that prolapse but       reduce spontaneously).  The exam was otherwise without abnormality. Impression:           - Diverticulosis in  the sigmoid colon.                       - Internal hemorrhoids.                       - The examination was otherwise normal.                       - No specimens collected. Recommendation:       - Repeat colonoscopy in 5 years for surveillance. Stool                        softeners and Anusol or Prep H as needed. Manya Silvas, MD 06/21/2017 10:21:31 AM This report has been signed electronically. Number of Addenda: 0 Note Initiated On: 06/21/2017 9:58 AM Scope Withdrawal Time: 0 hours 5 minutes 21 seconds  Total Procedure Duration: 0 hours 10 minutes 46 seconds       [facility]

## 2017-06-21 NOTE — Transfer of Care (Signed)
Immediate Anesthesia Transfer of Care Note  Patient: Megan Daniels  Procedure(s) Performed: COLONOSCOPY WITH PROPOFOL (N/A )  Patient Location: Endoscopy Unit  Anesthesia Type:General  Level of Consciousness: sedated  Airway & Oxygen Therapy: Patient Spontanous Breathing and Patient connected to nasal cannula oxygen  Post-op Assessment: Report given to RN and Post -op Vital signs reviewed and stable  Post vital signs: Reviewed and stable  Last Vitals:  Vitals:   06/21/17 0924  BP: (!) 159/73  Pulse: (!) 58  Resp: 17  Temp: (!) 36.2 C  SpO2: 100%    Last Pain:  Vitals:   06/21/17 0924  TempSrc: Tympanic         Complications: No apparent anesthesia complications

## 2017-06-22 ENCOUNTER — Encounter: Payer: Self-pay | Admitting: Unknown Physician Specialty

## 2017-06-27 ENCOUNTER — Inpatient Hospital Stay: Admission: RE | Admit: 2017-06-27 | Payer: Medicare Other | Source: Ambulatory Visit

## 2017-06-29 ENCOUNTER — Encounter
Admission: RE | Admit: 2017-06-29 | Discharge: 2017-06-29 | Disposition: A | Discharge status: Home / Self Care | Payer: Medicare Other | Source: Ambulatory Visit | Attending: Orthopedic Surgery | Admitting: Orthopedic Surgery

## 2017-06-29 DIAGNOSIS — Z01818 Encounter for other preprocedural examination: Secondary | ICD-10-CM | POA: Diagnosis present

## 2017-06-29 DIAGNOSIS — I10 Essential (primary) hypertension: Secondary | ICD-10-CM | POA: Insufficient documentation

## 2017-06-29 DIAGNOSIS — I517 Cardiomegaly: Secondary | ICD-10-CM | POA: Insufficient documentation

## 2017-06-29 HISTORY — DX: Irritable bowel syndrome, unspecified: K58.9

## 2017-06-29 HISTORY — DX: Nontoxic single thyroid nodule: E04.1

## 2017-06-29 HISTORY — DX: Anxiety disorder, unspecified: F41.9

## 2017-06-29 HISTORY — DX: Personal history of other specified conditions: Z87.898

## 2017-06-29 LAB — SURGICAL PCR SCREEN
MRSA, PCR: NEGATIVE
STAPHYLOCOCCUS AUREUS: NEGATIVE

## 2017-06-29 NOTE — Patient Instructions (Addendum)
Your procedure is scheduled on July 06, 2017 [facility] ) Report to Same  Day Surgery.(MEDICAL MALL ) SECOND FLOOR To find out your arrival time please call [phone] between 1PM - 3PM on July 05, 2017 [facility] )  Remember: Instructions that are not followed completely may result in serious medical risk, up to and including death, or upon the discretion of your surgeon and anesthesiologist your surgery may need to be rescheduled.     _X__ 1. Do not eat food after midnight the night before your procedure.                 No gum chewing or hard candies. You may drink clear liquids up to 2 hours                 before you are scheduled to arrive for your surgery- DO not drink clear                 liquids within 2 hours of the start of your surgery.                 Clear Liquids include:  water, apple juice without pulp, clear carbohydrate                 drink such as Clearfast of Gartorade, Black Coffee or Tea (Do not add                 anything to coffee or tea).     _X__ 2.  No Alcohol for 24 hours before or after surgery.   _X__ 3.  Do Not Smoke or use e-cigarettes For 24 Hours Prior to Your Surgery.                 Do not use any chewable tobacco products for at least 6 hours prior to                 surgery.  ____  4.  Bring all medications with you on the day of surgery if instructed.   __X__  5.  Notify your doctor if there is any change in your medical condition      (cold, fever, infections).     Do not wear jewelry, make-up, hairpins, clips or nail polish. Do not wear lotions, powders, or perfumes.  Do not shave 48 hours prior to surgery. Men may shave face and neck. Do not bring valuables to the hospital.    [facility] is not responsible for any belongings or valuables.  Contacts, dentures or bridgework may not be worn into surgery. Leave your suitcase in the car. After surgery it may be brought to your room. For patients admitted to  the hospital, discharge time is determined by your treatment team.   Patients discharged the day of surgery will not be allowed to drive home.   Please read over the following fact sheets that you were given:             __X__ Take these medicines the morning of surgery with A SIP OF WATER:    1.METOPROLOL  2. LEXAPRO  3. PANTOPRAZOLE  4.  5.  6.  ____ Fleet Enema (as directed)   _X___ Use CHG Soap as directed  __X__ Use inhalers on the day of surgery (USE COMBIVENT INHALER THE MORNING OF SURGERY AND BRING TO HOSPITAL )  ____ Stop metformin 2 days prior to surgery    ____ Take 1/2 of usual insulin dose the  night before surgery. No insulin the morning          of surgery.   _X___ Stop Coumadin/Plavix/aspirin on (STOP ASPIRIN ONE WEEK PRIOR TO SURGERY )  _X___ Stop Anti-inflammatories on (NO ASPIRIN PRODUCTS )   _X___ Stop supplements until after surgery.   (STOP VITAMIN C NOW )  _X___ Bring C-Pap to the hospital.

## 2017-07-06 ENCOUNTER — Ambulatory Visit
Admission: RE | Admit: 2017-07-06 | Discharge: 2017-07-06 | Disposition: A | Discharge status: Home / Self Care | Payer: Medicare Other | Source: Ambulatory Visit | Attending: Orthopedic Surgery | Admitting: Orthopedic Surgery

## 2017-07-06 ENCOUNTER — Encounter
Admission: RE | Disposition: A | Discharge status: Home / Self Care | Payer: Self-pay | Source: Ambulatory Visit | Attending: Orthopedic Surgery

## 2017-07-06 ENCOUNTER — Ambulatory Visit: Payer: Medicare Other | Admitting: Certified Registered Nurse Anesthetist

## 2017-07-06 ENCOUNTER — Encounter: Payer: Self-pay | Admitting: *Deleted

## 2017-07-06 DIAGNOSIS — G4730 Sleep apnea, unspecified: Secondary | ICD-10-CM | POA: Diagnosis not present

## 2017-07-06 DIAGNOSIS — I10 Essential (primary) hypertension: Secondary | ICD-10-CM | POA: Diagnosis not present

## 2017-07-06 DIAGNOSIS — G5602 Carpal tunnel syndrome, left upper limb: Secondary | ICD-10-CM | POA: Diagnosis not present

## 2017-07-06 DIAGNOSIS — K219 Gastro-esophageal reflux disease without esophagitis: Secondary | ICD-10-CM | POA: Insufficient documentation

## 2017-07-06 DIAGNOSIS — J449 Chronic obstructive pulmonary disease, unspecified: Secondary | ICD-10-CM | POA: Diagnosis not present

## 2017-07-06 HISTORY — PX: CARPAL TUNNEL RELEASE: SHX101

## 2017-07-06 SURGERY — CARPAL TUNNEL RELEASE
Anesthesia: Regional | Laterality: Left | Wound class: Clean

## 2017-07-06 MED ORDER — MIDAZOLAM HCL 2 MG/2ML IJ SOLN
INTRAMUSCULAR | Status: AC
  Filled 2017-07-06: qty 2

## 2017-07-06 MED ORDER — SODIUM CHLORIDE 0.9 % IV SOLN: INTRAVENOUS | Status: DC

## 2017-07-06 MED ORDER — METOCLOPRAMIDE HCL 10 MG PO TABS: 5.0000 mg | ORAL_TABLET | Freq: Three times a day (TID) | ORAL | Status: DC | PRN

## 2017-07-06 MED ORDER — MIDAZOLAM HCL 2 MG/2ML IJ SOLN
INTRAMUSCULAR | Status: DC | PRN
  Administered 2017-07-06: 2 mg via INTRAVENOUS

## 2017-07-06 MED ORDER — METOCLOPRAMIDE HCL 5 MG/ML IJ SOLN: 5.0000 mg | Freq: Three times a day (TID) | INTRAMUSCULAR | Status: DC | PRN

## 2017-07-06 MED ORDER — ONDANSETRON HCL 4 MG/2ML IJ SOLN: 4.0000 mg | Freq: Four times a day (QID) | INTRAMUSCULAR | Status: DC | PRN

## 2017-07-06 MED ORDER — PROPOFOL 10 MG/ML IV BOLUS
INTRAVENOUS | Status: AC
  Filled 2017-07-06: qty 20

## 2017-07-06 MED ORDER — TRAMADOL HCL 50 MG PO TABS: 50.0000 mg | ORAL_TABLET | Freq: Four times a day (QID) | ORAL | 0 refills | Status: AC | PRN

## 2017-07-06 MED ORDER — LIDOCAINE HCL (PF) 0.5 % IJ SOLN
INTRAMUSCULAR | Status: AC
  Filled 2017-07-06: qty 50

## 2017-07-06 MED ORDER — BUPIVACAINE HCL (PF) 0.5 % IJ SOLN
INTRAMUSCULAR | Status: DC | PRN
Start: 2017-07-06 — End: 2017-07-06
  Administered 2017-07-06: 10 mL
  Administered 2017-07-06: 1 mL

## 2017-07-06 MED ORDER — ONDANSETRON HCL 4 MG PO TABS: 4.0000 mg | ORAL_TABLET | Freq: Four times a day (QID) | ORAL | Status: DC | PRN

## 2017-07-06 MED ORDER — TRIAMCINOLONE ACETONIDE 40 MG/ML IJ SUSP
INTRAMUSCULAR | Status: DC | PRN
  Administered 2017-07-06: .5 mL

## 2017-07-06 MED ORDER — ONDANSETRON HCL 4 MG/2ML IJ SOLN
INTRAMUSCULAR | Status: DC | PRN
  Administered 2017-07-06: 4 mg via INTRAVENOUS

## 2017-07-06 MED ORDER — LACTATED RINGERS IV SOLN
INTRAVENOUS | Status: DC
  Administered 2017-07-06: 08:00:00 via INTRAVENOUS

## 2017-07-06 MED ORDER — BUPIVACAINE HCL (PF) 0.5 % IJ SOLN
INTRAMUSCULAR | Status: AC
  Filled 2017-07-06: qty 30

## 2017-07-06 MED ORDER — PROPOFOL 500 MG/50ML IV EMUL
INTRAVENOUS | Status: DC | PRN
  Administered 2017-07-06: 25 ug/kg/min via INTRAVENOUS

## 2017-07-06 MED ORDER — TRIAMCINOLONE ACETONIDE 40 MG/ML IJ SUSP
INTRAMUSCULAR | Status: AC
Start: 2017-07-06 — End: 2017-07-06
  Filled 2017-07-06: qty 1

## 2017-07-06 MED ORDER — FENTANYL CITRATE (PF) 100 MCG/2ML IJ SOLN
INTRAMUSCULAR | Status: AC
  Filled 2017-07-06: qty 2

## 2017-07-06 MED ORDER — LIDOCAINE HCL (CARDIAC) 20 MG/ML IV SOLN
INTRAVENOUS | Status: DC | PRN
  Administered 2017-07-06: 60 mg via INTRAVENOUS

## 2017-07-06 SURGICAL SUPPLY — 25 items
BANDAGE ACE 3X5.8 VEL STRL LF (GAUZE/BANDAGES/DRESSINGS) ×3 IMPLANT
BNDG ESMARK 4X12 TAN STRL LF (GAUZE/BANDAGES/DRESSINGS) ×3 IMPLANT
CANISTER SUCT 1200ML W/VALVE (MISCELLANEOUS) ×3 IMPLANT
CHLORAPREP W/TINT 26ML (MISCELLANEOUS) ×3 IMPLANT
CUFF DUAL TOURNIQUET 18IN DISP (TOURNIQUET CUFF) ×3 IMPLANT
CUFF TOURN 18 STER (MISCELLANEOUS) IMPLANT
ELECT CAUTERY NEEDLE 2.0 MIC (NEEDLE) IMPLANT
GAUZE PETRO XEROFOAM 1X8 (MISCELLANEOUS) ×3 IMPLANT
GAUZE SPONGE 4X4 12PLY STRL (GAUZE/BANDAGES/DRESSINGS) ×3 IMPLANT
GLOVE SURG SYN 9.0  PF PI (GLOVE) ×2
GLOVE SURG SYN 9.0 PF PI (GLOVE) ×1 IMPLANT
GOWN SRG 2XL LVL 4 RGLN SLV (GOWNS) ×1 IMPLANT
GOWN STRL NON-REIN 2XL LVL4 (GOWNS) ×2
GOWN STRL REUS W/ TWL LRG LVL3 (GOWN DISPOSABLE) ×2 IMPLANT
GOWN STRL REUS W/TWL LRG LVL3 (GOWN DISPOSABLE) ×4
KIT RM TURNOVER STRD PROC AR (KITS) ×3 IMPLANT
NS IRRIG 500ML POUR BTL (IV SOLUTION) ×3 IMPLANT
PACK EXTREMITY ARMC (MISCELLANEOUS) ×3 IMPLANT
PAD CAST CTTN 4X4 STRL (SOFTGOODS) ×1 IMPLANT
PADDING CAST COTTON 4X4 STRL (SOFTGOODS) ×2
SUT ETHILON 4-0 (SUTURE) ×2
SUT ETHILON 4-0 FS2 18XMFL BLK (SUTURE) ×1
SUTURE ETHLN 4-0 FS2 18XMF BLK (SUTURE) ×1 IMPLANT
SYR 3ML 18GX1 1/2 (SYRINGE) ×6 IMPLANT
SYR 5ML 18GX1 1/2 (NEEDLE) ×3 IMPLANT

## 2017-07-06 NOTE — Anesthesia Procedure Notes (Signed)
Procedure Name: MAC Performed by: Demetrius Charity Pre-anesthesia Checklist: Patient identified, Patient being monitored, Timeout performed, Emergency Drugs available and Suction available Oxygen Delivery Method: Simple face mask Dental Injury: Teeth and Oropharynx as per pre-operative assessment

## 2017-07-06 NOTE — Transfer of Care (Signed)
Immediate Anesthesia Transfer of Care Note  Patient: Megan Daniels  Procedure(s) Performed: CARPAL TUNNEL RELEASE AND LEFT THUMB CMC INJECTION (Left )  Patient Location: PACU  Anesthesia Type: Regional  Level of Consciousness: awake, alert  and oriented  Airway & Oxygen Therapy: Patient Spontanous Breathing and Patient connected to face mask oxygen  Post-op Assessment: Report given to RN and Post -op Vital signs reviewed and stable  Post vital signs: Reviewed and stable  Last Vitals:  Vitals:   07/06/17 0818  BP: (!) 153/76  Pulse: (!) 59  Resp: 18  Temp: (!) 35.8 C  SpO2: 95%    Last Pain:  Vitals:   07/06/17 0818  TempSrc: Tympanic         Complications: No apparent anesthesia complications

## 2017-07-06 NOTE — Anesthesia Preprocedure Evaluation (Signed)
Anesthesia Evaluation  Patient identified by MRN, date of birth, ID band Patient awake    Reviewed: Allergy & Precautions, H&P , NPO status , Patient's Chart, lab work & pertinent test results, reviewed documented beta blocker date and time   History of Anesthesia Complications (+) PONV and history of anesthetic complications  Airway Mallampati: II  TM Distance: >3 FB Neck ROM: full    Dental  (+) Teeth Intact   Pulmonary neg pulmonary ROS, shortness of breath and with exertion, asthma , sleep apnea and Continuous Positive Airway Pressure Ventilation , pneumonia, COPD,    Pulmonary exam normal        Cardiovascular Exercise Tolerance: Poor hypertension, On Medications negative cardio ROS Normal cardiovascular exam Rate:Normal     Neuro/Psych  Headaches,  Neuromuscular disease negative neurological ROS  negative psych ROS   GI/Hepatic negative GI ROS, Neg liver ROS, hiatal hernia, GERD  ,  Endo/Other  negative endocrine ROS  Renal/GU negative Renal ROS  negative genitourinary   Musculoskeletal   Abdominal   Peds  Hematology negative hematology ROS (+)   Anesthesia Other Findings   Reproductive/Obstetrics negative OB ROS                             Anesthesia Physical Anesthesia Plan  ASA: III  Anesthesia Plan: General LMA and Bier Block   Post-op Pain Management:    Induction:   PONV Risk Score and Plan:   Airway Management Planned:   Additional Equipment:   Intra-op Plan:   Post-operative Plan:   Informed Consent: I have reviewed the patients History and Physical, chart, labs and discussed the procedure including the risks, benefits and alternatives for the proposed anesthesia with the patient or authorized representative who has indicated his/her understanding and acceptance.     Plan Discussed with: CRNA  Anesthesia Plan Comments:         Anesthesia Quick  Evaluation

## 2017-07-06 NOTE — Anesthesia Post-op Follow-up Note (Signed)
Anesthesia QCDR form completed.        
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
## 2017-07-06 NOTE — Anesthesia Postprocedure Evaluation (Signed)
Anesthesia Post Note  Patient: Megan Daniels  Procedure(s) Performed: CARPAL TUNNEL RELEASE AND LEFT THUMB CMC INJECTION (Left )  Patient location during evaluation: PACU Anesthesia Type: Bier Block Level of consciousness: awake and alert Pain management: pain level controlled Vital Signs Assessment: post-procedure vital signs reviewed and stable Respiratory status: spontaneous breathing, nonlabored ventilation, respiratory function stable and patient connected to nasal cannula oxygen Cardiovascular status: blood pressure returned to baseline and stable Postop Assessment: no apparent nausea or vomiting Anesthetic complications: no     Last Vitals:  Vitals:   07/06/17 1045 07/06/17 1059  BP: 129/80 135/64  Pulse: (!) 58 (!) 58  Resp: 16 13  Temp:    SpO2: 100% 96%    Last Pain:  Vitals:   07/06/17 1059  TempSrc:   PainSc: 0-No pain                 Molli Barrows

## 2017-07-06 NOTE — Discharge Instructions (Addendum)
AMBULATORY SURGERY  DISCHARGE INSTRUCTIONS   1) The drugs that you were given will stay in your system until tomorrow so for the next 24 hours you should not:  A) Drive an automobile B) Make any legal decisions C) Drink any alcoholic beverage   2) You may resume regular meals tomorrow.  Today it is better to start with liquids and gradually work up to solid foods.  You may eat anything you prefer, but it is better to start with liquids, then soup and crackers, and gradually work up to solid foods.   3) Please notify your doctor immediately if you have any unusual bleeding, trouble breathing, redness and pain at the surgery site, drainage, fever, or pain not relieved by medication.    4) Additional Instructions:        Please contact your physician with any problems or Same Day Surgery at [phone], Monday through Friday 6 am to 4 pm, or [place] [facility] number at [phone].                                        is: Date:                        Time:                       .                                         .                                           AMBULATORY SURGERY  DISCHARGE INSTRUCTIONS   5) The drugs that you were given will stay in your system until tomorrow so for the next 24 hours you should not:  D) Drive an automobile E) Make any legal decisions F) Drink any alcoholic beverage   6) You may resume regular meals tomorrow.  Today it is better to start with liquids and gradually work up to solid foods.  You may eat anything you prefer, but it is better to start with liquids, then soup and crackers, and gradually work up to solid foods.   7) Please notify your doctor immediately if you have any unusual bleeding, trouble breathing, redness and pain at the surgery site, drainage, fever, or pain not relieved by medication. 8)   9) Your post-operative visit with Dr.                                      is: Date:                        Time:    Please call to schedule your post-operative visit.  10) Additional Instructions:       Work on finger range of motion. Loosen Ace wrap before leaving the hospital and if fingers swell. Pain medicine as directed.

## 2017-07-06 NOTE — H&P (Signed)
Reviewed paper H+P, will be scanned into chart. No changes noted.  

## 2017-07-31 ENCOUNTER — Ambulatory Visit
Admission: RE | Admit: 2017-07-31 | Discharge: 2017-07-31 | Disposition: A | Discharge status: Home / Self Care | Payer: Medicare Other | Source: Ambulatory Visit | Attending: Internal Medicine | Admitting: Internal Medicine

## 2017-07-31 ENCOUNTER — Ambulatory Visit: Payer: Medicare Other | Attending: Orthopedic Surgery | Admitting: Occupational Therapy

## 2017-07-31 DIAGNOSIS — Z1231 Encounter for screening mammogram for malignant neoplasm of breast: Secondary | ICD-10-CM | POA: Insufficient documentation

## 2017-07-31 DIAGNOSIS — M79642 Pain in left hand: Secondary | ICD-10-CM | POA: Diagnosis present

## 2017-07-31 DIAGNOSIS — M6281 Muscle weakness (generalized): Secondary | ICD-10-CM | POA: Diagnosis present

## 2017-07-31 DIAGNOSIS — L905 Scar conditions and fibrosis of skin: Secondary | ICD-10-CM | POA: Insufficient documentation

## 2017-07-31 NOTE — Therapy (Signed)
[facility] [address] [place], [address] Phone: [phone]   Fax:  [phone]  Occupational Therapy Evaluation  Patient Details  Name: KATHRIN FOLDEN MRN: [id number] Date of Birth: 1951-06-30 Referring Provider: Rachelle Hora    Encounter Date: 07/31/2017  OT End of Session - 07/31/17 1909    Visit Number  1    Number of Visits  4    Date for OT Re-Evaluation  08/28/17    OT Start Time  1100    OT Stop Time  1205    OT Time Calculation (min)  65 min    Activity Tolerance  Patient tolerated treatment well    Behavior During Therapy  [facility] for tasks assessed/performed       Past Medical History:  Diagnosis Date  . Abnormal Pap smear of cervix    pos hpv-   . Acid reflux   . Anxiety   . Arthritis   . Asthma    Symbicort as needed  . Cancer ([place])    Basal Cell Skin Cancer  . Chalazion of left eye   . Chronic back pain    spondylolisthesis/stenosis  . Chronic UTI    was treated for 3 months with Macrobid and in Dec everything was fine  . Constipation   . COPD (chronic obstructive pulmonary disease) ([place])   . Diverticulosis   . Elevated serum creatinine    with nsaids  . Foot drop    right  . Genital warts    h/o  . GERD (gastroesophageal reflux disease)    takes Prevacid daily  . Headache   . History of bronchitis   . History of colon polyps   . History of hiatal hernia   . Hx of vertigo   . Hypertension    takes Losartan daily  . IBS (irritable bowel syndrome)   . Joint pain   . Palpitations    takes Metoprolol daily  . Pneumonia    hx of-early 2000's  . PONV (postoperative nausea and vomiting)    extreme nausea and vomiting -none last surgery 10/29/14  . Sarcoidosis   . Seasonal allergies    takes Claritin daily  . Shortness of breath dyspnea   . Sleep apnea    cpap 10 yrs  . Stress incontinence   . Thyroid nodule   . URI (upper respiratory infection)    treated self  with Mucinex and no fever  . Urinary urgency   . Vitamin D deficiency   . Weakness    numbness and tingling in both legs    Past Surgical History:  Procedure Laterality Date  . ABDOMINAL HYSTERECTOMY    . ANTERIOR CERVICAL DECOMP/DISCECTOMY FUSION N/A 10/06/2016   Procedure: ANTERIOR CERVICAL DECOMPRESSION/DISCECTOMY FUSION , INTERBODY PROSTHESIS, PLATE CERVICAL FOUR CERVICAL FIVE, CERVICAL SIX- CERVICAL SEVEN;  Surgeon: Newman Pies, MD;  Location: [place];  Service: Neurosurgery;  Laterality: N/A;  anterior  . BACK SURGERY      X 3  . BREAST BIOPSY Right 1996   EXCISIONAL - NEG  . BUNIONECTOMY Bilateral   . CARPAL TUNNEL RELEASE Right    x 2  . CARPAL TUNNEL RELEASE Left 07/06/2017   Procedure: CARPAL TUNNEL RELEASE AND LEFT THUMB [place] INJECTION;  Surgeon: Hessie Knows, MD;  Location: [facility] ORS;  Service: Orthopedics;  Laterality: Left;  . cataract surgery Bilateral   . CERVICAL FUSION      X 2  . COLONOSCOPY    .  COLONOSCOPY WITH PROPOFOL N/A 06/21/2017   Procedure: COLONOSCOPY WITH PROPOFOL;  Surgeon: Manya Silvas, MD;  Location: [facility] ENDOSCOPY;  Service: Endoscopy;  Laterality: N/A;  . ESOPHAGOGASTRODUODENOSCOPY    . EXPLORATORY LAPAROTOMY    . EYE SURGERY    . left middle finger surgery     pin- arthritic cyst  . LUMBAR LAMINECTOMY/DECOMPRESSION MICRODISCECTOMY Right 05/04/2015   Procedure: RIGHT LUMBAR TWO-THREE/THREE-FOUR LAMINECTOMY/DECOMPRESSION ;  Surgeon: Newman Pies, MD;  Location: [place] NEURO ORS;  Service: Neurosurgery;  Laterality: Right;  Right L23 L34 laminectomy and foraminotomy  . Nissan fundoplication    . TONSILLECTOMY      There were no vitals filed for this visit.  Subjective Assessment - 07/31/17 1851    Subjective   I had about 4 wks ago CTR and Dr Rudene Christians gave me shot in thumb - but pain still in thumb and at wrist - I am afraid for scar tissue     Patient Stated Goals  I want to get the pain better that I can use it  - like pushing up , cooking ,  yard work , typing, hold book     Currently in Pain?  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, cook , Haematologist , travel , [employer]       AROM   Right Wrist Extension  68 Degrees    Right Wrist Flexion  90 Degrees    Left Wrist Extension  60 Degrees    Left Wrist Flexion  88 Degrees      Left Hand AROM   L Thumb Opposition to Index  -- To base of 5th     L Index  MCP 0-90  85 Degrees    L Long  MCP 0-90  85 Degrees    L Ring  MCP 0-90  90 Degrees    L Little  MCP 0-90  90 Degrees       Paraffin prior to review of HEP - decrease thumb CMC pain and decrease scar tissue   Review HEP and hand out provided   Moist heat  Scar massage   cica scar pad for night time  Tendonglides  Med N glides  Joint protection principles AE education  CMC neoprene splint fitted to use with act that cause thumb pain               OT Education - 07/31/17 1909    Education provided  Yes    Education Details  Findings of eval , HEP     Person(s) Educated  Patient    Methods  Explanation;Demonstration;Tactile cues;Verbal cues;Handout    Comprehension  Returned demonstration;Verbal cues  required;Need further instruction       OT Short Term Goals - 07/31/17 1916      OT SHORT TERM GOAL #1   Title  Pain on PRWHE improve with more than 15 points     Baseline  Pain on PRWHE at eval 28/50    Time  2    Period  Weeks    Status  New    Target Date  08/14/17      OT SHORT  TERM GOAL #2   Title  Pt to be ind in HEP to decrease scar tissue , splint wearing , and decrease pain at  L wrist and thumb     Baseline  very little knowledge     Time  2    Period  Weeks    Status  New    Target Date  08/14/17        OT Long Term Goals - 07/31/17 1918      OT LONG TERM GOAL #1   Title  Function socre on PRWHE improve with more than 10 points using L hand     Baseline  Function score on PRWHE at eval 19.5/50    Time  4    Period  Weeks    Status  New    Target Date  08/28/17      OT LONG TERM GOAL #2   Title  Pt verbalize 3 jointprotection and AE to implement to decrease L thumb CMC pain during use in ADL's and IADl's     Baseline  no knowledge     Time  3    Period  Weeks    Status  New    Target Date  08/21/17      OT LONG TERM GOAL #3   Title  Assess grip and prehension strength - L hand to be more than 50% compare to R hand     Baseline  pt 3 1/2 wks s/p    Time  3    Period  Weeks    Status  New    Target Date  08/17/17            Plan - 07/31/17 1910    Clinical Impression Statement  Pt present at eval 3 1/2 wks s/p L CTR and also L CMC OA pain - pt show increase scar tissue, decrease wrist ROM , increase pain at volar wrist and thumb CMC and decrease grip and prehension - limiting her functional use of L hand in ADL's and IADL;s     Occupational performance deficits (Please refer to evaluation for details):  ADL's;IADL's;Play;Leisure    Rehab Potential  Good    OT Frequency  1x / week    OT Duration  4 weeks    OT Treatment/Interventions  Self-care/ADL training;Fluidtherapy;Patient/family education;Splinting;Therapeutic exercises;Ultrasound;Scar mobilization;Parrafin;Manual Therapy    Plan  assess progress with homeprogram     Clinical Decision Making  Several treatment options, min-mod task modification necessary    OT Home Exercise Plan  see pt instruction     Consulted and Agree with Plan of Care  Patient       Patient will  benefit from skilled therapeutic intervention in order to improve the following deficits and impairments:  Decreased knowledge of use of DME, Impaired flexibility, Pain, Decreased scar mobility, Decreased range of motion, Impaired UE functional use, Impaired sensation, Decreased strength  Visit Diagnosis: Scar tissue - Plan: Ot plan of care cert/re-cert  Pain in left hand -  Plan: Ot plan of care cert/re-cert  Muscle weakness (generalized) - Plan: Ot plan of care cert/re-cert  G-Codes - 32/02/33 1923    Functional Assessment Tool Used (Outpatient only)  PRWHE , pain scale, ROM , scar tissue , clinical judgement     Functional Limitation  Self care    Self Care Current Status [id number])  At least 20 percent but less than 40 percent impaired, limited or restricted    Self Care Goal Status (S1683)  At least 1 percent but less than 20 percent impaired, limited or restricted       Problem List Patient Active Problem List   Diagnosis Date Noted  . Cervical spondylosis with radiculopathy 10/06/2016  . Family history of breast cancer in mother 07/02/2015  . History of total vaginal hysterectomy 07/02/2015  . Lumbar stenosis 05/04/2015  . Spondylolisthesis of lumbar region 10/29/2014  . BP (high blood pressure) 03/13/2014  . HLD (hyperlipidemia) 03/13/2014  . Awareness of heartbeats 03/13/2014  . Besnier-Boeck disease 03/13/2014  . Sarcoidosis 03/13/2014  . Airway hyperreactivity 02/15/2013    Rosalyn Gess OTR/L,CLT 07/31/2017, 7:25 PM  [facility] [address] [place], [address] Phone: [phone]   Fax:  [phone]  Name: VIRNA LIVENGOOD MRN: [id number] Date of Birth: 1951-04-24

## 2017-07-31 NOTE — Patient Instructions (Signed)
Moist heat  Scar massage   cica scar pad for night time  Tendonglides  Med N glides  Joint protection principles AE education  CMC neoprene splint fitted to use with act that cause thumb pain

## 2017-08-09 ENCOUNTER — Ambulatory Visit: Payer: Medicare Other | Attending: Orthopedic Surgery | Admitting: Occupational Therapy

## 2017-08-09 DIAGNOSIS — M79642 Pain in left hand: Secondary | ICD-10-CM | POA: Insufficient documentation

## 2017-08-09 DIAGNOSIS — L905 Scar conditions and fibrosis of skin: Secondary | ICD-10-CM | POA: Diagnosis not present

## 2017-08-09 DIAGNOSIS — M6281 Muscle weakness (generalized): Secondary | ICD-10-CM | POA: Insufficient documentation

## 2017-08-09 NOTE — Therapy (Signed)
[facility] [address] [place], [address] Phone: [phone]   Fax:  [phone]  Occupational Therapy Treatment  Patient Details  Name: Megan Daniels MRN: [id number] Date of Birth: [birth year] Referring Provider: Rachelle Hora    Encounter Date: 08/09/2017  OT End of Session - 08/09/17 1551    Visit Number  2    Number of Visits  2    Date for OT Re-Evaluation  08/09/17    OT Start Time  1301    OT Stop Time  1332    OT Time Calculation (min)  31 min    Activity Tolerance  Patient tolerated treatment well    Behavior During Therapy  [facility] for tasks assessed/performed       Past Medical History:  Diagnosis Date  . Abnormal Pap smear of cervix    pos hpv-   . Acid reflux   . Anxiety   . Arthritis   . Asthma    Symbicort as needed  . Cancer ([place])    Basal Cell Skin Cancer  . Chalazion of left eye   . Chronic back pain    spondylolisthesis/stenosis  . Chronic UTI    was treated for 3 months with Macrobid and in Dec everything was fine  . Constipation   . COPD (chronic obstructive pulmonary disease) ([place])   . Diverticulosis   . Elevated serum creatinine    with nsaids  . Foot drop    right  . Genital warts    h/o  . GERD (gastroesophageal reflux disease)    takes Prevacid daily  . Headache   . History of bronchitis   . History of colon polyps   . History of hiatal hernia   . Hx of vertigo   . Hypertension    takes Losartan daily  . IBS (irritable bowel syndrome)   . Joint pain   . Palpitations    takes Metoprolol daily  . Pneumonia    hx of-early 2000's  . PONV (postoperative nausea and vomiting)    extreme nausea and vomiting -none last surgery 10/29/14  . Sarcoidosis   . Seasonal allergies    takes Claritin daily  . Shortness of breath dyspnea   . Sleep apnea    cpap 10 yrs  . Stress incontinence   . Thyroid nodule   . URI (upper respiratory infection)    treated self  with Mucinex and no fever  . Urinary urgency   . Vitamin D deficiency   . Weakness    numbness and tingling in both legs    Past Surgical History:  Procedure Laterality Date  . ABDOMINAL HYSTERECTOMY    . ANTERIOR CERVICAL DECOMP/DISCECTOMY FUSION N/A 10/06/2016   Procedure: ANTERIOR CERVICAL DECOMPRESSION/DISCECTOMY FUSION , INTERBODY PROSTHESIS, PLATE CERVICAL FOUR CERVICAL FIVE, CERVICAL SIX- CERVICAL SEVEN;  Surgeon: Newman Pies, MD;  Location: [place];  Service: Neurosurgery;  Laterality: N/A;  anterior  . BACK SURGERY      X 3  . BREAST BIOPSY Right 1996   EXCISIONAL - NEG  . BUNIONECTOMY Bilateral   . CARPAL TUNNEL RELEASE Right    x 2  . CARPAL TUNNEL RELEASE Left 07/06/2017   Procedure: CARPAL TUNNEL RELEASE AND LEFT THUMB [place] INJECTION;  Surgeon: Hessie Knows, MD;  Location: [facility];  Service: Orthopedics;  Laterality: Left;  . cataract surgery Bilateral   . CERVICAL FUSION      X 2  . COLONOSCOPY    .  COLONOSCOPY WITH PROPOFOL N/A 06/21/2017   Procedure: COLONOSCOPY WITH PROPOFOL;  Surgeon: Manya Silvas, MD;  Location[facility];  Service: Endoscopy;  Laterality: N/A;  . ESOPHAGOGASTRODUODENOSCOPY    . EXPLORATORY LAPAROTOMY    . EYE SURGERY    . left middle finger surgery     pin- arthritic cyst  . LUMBAR LAMINECTOMY/DECOMPRESSION MICRODISCECTOMY Right 05/04/2015   Procedure: RIGHT LUMBAR TWO-THREE/THREE-FOUR LAMINECTOMY/DECOMPRESSION ;  Surgeon: Newman Pies, MD;  Location: [place] [facility];  Service: Neurosurgery;  Laterality: Right;  Right L23 L34 laminectomy and foraminotomy  . Nissan fundoplication    . TONSILLECTOMY      There were no vitals filed for this visit.  Subjective Assessment - 08/09/17 1547    Subjective   I did get me a springloaded scissors, and will get me some of the others things- I did use the soft black splint on thumb for driving and it helped - pain little better -I maybe over did the exercises in the beginning  and had some pain  - but did back off some     Patient Stated Goals  I want to get the pain better that I can use it  - like pushing up , cooking , yard work , typing, hold book     Currently in Pain?  Yes    Pain Score  2     Pain Location  Finger (Comment which one)    Pain Orientation  Left    Pain Descriptors / Indicators  Aching;Pins and needles    Pain Type  Surgical pain    Pain Onset  More than a month ago        Assess grip and prehension strength - without and with CMC neoprene splint  Pt show increase grip and decrease pain with grip and 3 point with splint Pt to use with act that cause pain  Light blue putty add to HEP - with splint on during 3 point  12 reps  1 x day  Review AROM HEP - tendon glides 10 reps  Pain free  Med N glide - 5 reps  But no pain  Opposition - but ony 5 reps and to tips  Cont with joint protection and AE   - pt invested in some of it yet            OT Treatments/Exercises (OP) - 08/09/17 0001      LUE Paraffin   Number Minutes Paraffin  10 Minutes    LUE Paraffin Location  Hand    Comments  decrease pain at thumb and decrease scar tissue              OT Education - 08/09/17 1550    Education provided  Yes    Education Details  Progress and HEP to cont wtih     Person(s) Educated  Patient    Methods  Explanation;Tactile cues;Verbal cues;Handout    Comprehension  Returned demonstration;Verbal cues required       OT Short Term Goals - 08/09/17 1553      OT SHORT TERM GOAL #1   Title  Pain on PRWHE improve with more than 15 points     Baseline  Pain on PRWHE at eval 28/50- pain improve with joint protection and using CMC neoprene splint - but NT with PRWHE     Status  Partially Met      OT SHORT TERM GOAL #2   Title  Pt to be ind in  HEP to decrease scar tissue , splint wearing , and decrease pain at  L wrist and thumb     Status  Achieved        OT Long Term Goals - 08/09/17 1554      OT LONG TERM GOAL #1   Title  Function  socre on PRWHE improve with more than 10 points using L hand     Baseline  Function score on PRWHE at eval 19.5/50 - using it more but still pain at times - use joint protection and AE     Status  Partially Met      OT LONG TERM GOAL #2   Title  Pt verbalize 3 jointprotection and AE to implement to decrease L thumb CMC pain during use in ADL's and IADl's     Status  Achieved      OT LONG TERM GOAL #3   Title  Assess grip and prehension strength - L hand to be more than 50% compare to R hand     Baseline  R hand 29 lbs and L 18 lbs ( 28 lbs with CMC neoprene splint - lat grip R 10 , L 7 lbs - 3 point grip R 6 and L 5 ( with neoprene splint 7 lbs     Status  Achieved            Plan - 08/09/17 1551    Clinical Impression Statement  Pt present about 4-5 wks s/p CTR and shot to L CMC - pt report decrease pain with use of CMC neoprene splint - and showed increase grip and 3 point grip with use of splint - to use with light putty - and painfull act - cont with scar mobs and HEP and can cont at home - phone if need to return      Occupational performance deficits (Please refer to evaluation for details):  ADL's;IADL's;Play;Leisure    Plan  discharge at this time with HEP     Clinical Decision Making  Limited treatment options, no task modification necessary    OT Home Exercise Plan  see pt instruction     Consulted and Agree with Plan of Care  Patient       Patient will benefit from skilled therapeutic intervention in order to improve the following deficits and impairments:     Visit Diagnosis: Scar tissue  Pain in left hand  Muscle weakness (generalized)    Problem List Patient Active Problem List   Diagnosis Date Noted  . Cervical spondylosis with radiculopathy 10/06/2016  . Family history of breast cancer in mother 07/02/2015  . History of total vaginal hysterectomy 07/02/2015  . Lumbar stenosis 05/04/2015  . Spondylolisthesis of lumbar region 10/29/2014  . BP (high blood  pressure) 03/13/2014  . HLD (hyperlipidemia) 03/13/2014  . Awareness of heartbeats 03/13/2014  . Besnier-Boeck disease 03/13/2014  . Sarcoidosis 03/13/2014  . Airway hyperreactivity 02/15/2013    Rosalyn Gess OTR/L,CLT 08/09/2017, 8:25 PM  [facility] [address] [place], [address] Phone: [phone]   Fax:  [phone]  Name: Megan Daniels MRN: [id number] Date of Birth: Jun 30, 1951

## 2017-08-09 NOTE — Patient Instructions (Signed)
Cont with scar massage and cica scar pad   thumb CMC neoprene splint to wear with painfull act   Cont with tendon glides , Med N glide(only 5 reps)  Opposition to all digits  Use joint protection and AE as needed   Add light blue putty for gripping and 3 point grip - pain free range and only 12 reps   1 x day

## 2017-12-18 DIAGNOSIS — N183 Chronic kidney disease, stage 3 unspecified: Secondary | ICD-10-CM | POA: Insufficient documentation

## 2018-06-25 ENCOUNTER — Other Ambulatory Visit: Payer: Self-pay | Admitting: Internal Medicine

## 2018-06-25 DIAGNOSIS — R1011 Right upper quadrant pain: Secondary | ICD-10-CM

## 2018-06-26 ENCOUNTER — Other Ambulatory Visit: Payer: Self-pay | Admitting: Internal Medicine

## 2018-06-26 DIAGNOSIS — Z1231 Encounter for screening mammogram for malignant neoplasm of breast: Secondary | ICD-10-CM

## 2018-06-27 ENCOUNTER — Other Ambulatory Visit: Payer: Self-pay | Admitting: Internal Medicine

## 2018-06-27 DIAGNOSIS — R1084 Generalized abdominal pain: Secondary | ICD-10-CM

## 2018-07-02 ENCOUNTER — Ambulatory Visit
Admission: RE | Admit: 2018-07-02 | Discharge: 2018-07-02 | Disposition: A | Discharge status: Home / Self Care | Payer: Medicare Other | Source: Ambulatory Visit | Attending: Internal Medicine | Admitting: Internal Medicine

## 2018-07-02 ENCOUNTER — Encounter (INDEPENDENT_AMBULATORY_CARE_PROVIDER_SITE_OTHER): Payer: Self-pay

## 2018-07-02 DIAGNOSIS — R1084 Generalized abdominal pain: Secondary | ICD-10-CM | POA: Diagnosis not present

## 2018-08-06 ENCOUNTER — Ambulatory Visit
Admission: RE | Admit: 2018-08-06 | Discharge: 2018-08-06 | Disposition: A | Discharge status: Home / Self Care | Payer: Medicare Other | Source: Ambulatory Visit | Attending: Internal Medicine | Admitting: Internal Medicine

## 2018-08-06 DIAGNOSIS — Z1231 Encounter for screening mammogram for malignant neoplasm of breast: Secondary | ICD-10-CM | POA: Diagnosis not present

## 2018-09-14 ENCOUNTER — Other Ambulatory Visit: Payer: Self-pay | Admitting: Internal Medicine

## 2018-09-14 ENCOUNTER — Other Ambulatory Visit (HOSPITAL_COMMUNITY): Payer: Self-pay | Admitting: Internal Medicine

## 2018-09-14 DIAGNOSIS — R1310 Dysphagia, unspecified: Secondary | ICD-10-CM

## 2018-10-15 ENCOUNTER — Ambulatory Visit
Admission: RE | Admit: 2018-10-15 | Discharge: 2018-10-15 | Disposition: A | Discharge status: Home / Self Care | Payer: Medicare Other | Source: Ambulatory Visit | Attending: Internal Medicine | Admitting: Internal Medicine

## 2018-10-15 ENCOUNTER — Other Ambulatory Visit: Payer: Self-pay | Admitting: Internal Medicine

## 2018-10-15 DIAGNOSIS — R1310 Dysphagia, unspecified: Secondary | ICD-10-CM | POA: Diagnosis not present

## 2019-02-07 ENCOUNTER — Other Ambulatory Visit: Payer: Self-pay | Admitting: Internal Medicine

## 2019-02-07 DIAGNOSIS — M79661 Pain in right lower leg: Secondary | ICD-10-CM

## 2019-02-11 ENCOUNTER — Ambulatory Visit
Admission: RE | Admit: 2019-02-11 | Discharge: 2019-02-11 | Disposition: A | Discharge status: Home / Self Care | Payer: Medicare Other | Source: Ambulatory Visit | Attending: Internal Medicine | Admitting: Internal Medicine

## 2019-02-11 ENCOUNTER — Other Ambulatory Visit: Payer: Self-pay

## 2019-02-11 DIAGNOSIS — M7989 Other specified soft tissue disorders: Secondary | ICD-10-CM | POA: Insufficient documentation

## 2019-02-11 DIAGNOSIS — M79661 Pain in right lower leg: Secondary | ICD-10-CM | POA: Insufficient documentation

## 2019-07-17 ENCOUNTER — Other Ambulatory Visit: Payer: Self-pay | Admitting: Internal Medicine

## 2019-07-17 DIAGNOSIS — R1011 Right upper quadrant pain: Secondary | ICD-10-CM

## 2019-07-17 DIAGNOSIS — Z1231 Encounter for screening mammogram for malignant neoplasm of breast: Secondary | ICD-10-CM

## 2019-07-23 ENCOUNTER — Other Ambulatory Visit: Payer: Self-pay

## 2019-07-23 ENCOUNTER — Ambulatory Visit
Admission: RE | Admit: 2019-07-23 | Discharge: 2019-07-23 | Disposition: A | Discharge status: Home / Self Care | Payer: Medicare Other | Source: Ambulatory Visit | Attending: Internal Medicine | Admitting: Internal Medicine

## 2019-07-23 DIAGNOSIS — R1011 Right upper quadrant pain: Secondary | ICD-10-CM | POA: Insufficient documentation

## 2019-07-23 MED ORDER — IOHEXOL 300 MG/ML  SOLN
50.0000 mL | Freq: Once | INTRAMUSCULAR | Status: AC | PRN
  Administered 2019-07-23: 09:00:00 80 mL via INTRAVENOUS

## 2019-08-08 ENCOUNTER — Other Ambulatory Visit: Payer: Self-pay

## 2019-08-08 ENCOUNTER — Ambulatory Visit
Admission: RE | Admit: 2019-08-08 | Discharge: 2019-08-08 | Disposition: A | Discharge status: Home / Self Care | Payer: Medicare Other | Source: Ambulatory Visit | Attending: Internal Medicine | Admitting: Internal Medicine

## 2019-08-08 DIAGNOSIS — Z1231 Encounter for screening mammogram for malignant neoplasm of breast: Secondary | ICD-10-CM | POA: Insufficient documentation

## 2019-11-11 ENCOUNTER — Other Ambulatory Visit: Payer: Self-pay

## 2019-11-13 ENCOUNTER — Other Ambulatory Visit: Payer: Self-pay

## 2019-11-13 ENCOUNTER — Ambulatory Visit (INDEPENDENT_AMBULATORY_CARE_PROVIDER_SITE_OTHER): Payer: Medicare Other | Admitting: Gastroenterology

## 2019-11-13 ENCOUNTER — Encounter: Payer: Self-pay | Admitting: Gastroenterology

## 2019-11-13 VITALS — BP 116/74 | HR 74 | Temp 98.1°F | Wt 190.2 lb

## 2019-11-13 DIAGNOSIS — K5909 Other constipation: Secondary | ICD-10-CM | POA: Diagnosis not present

## 2019-11-13 DIAGNOSIS — R1310 Dysphagia, unspecified: Secondary | ICD-10-CM

## 2019-11-13 DIAGNOSIS — K219 Gastro-esophageal reflux disease without esophagitis: Secondary | ICD-10-CM

## 2019-11-13 DIAGNOSIS — R1319 Other dysphagia: Secondary | ICD-10-CM

## 2019-11-13 DIAGNOSIS — R7989 Other specified abnormal findings of blood chemistry: Secondary | ICD-10-CM | POA: Insufficient documentation

## 2019-11-13 NOTE — Progress Notes (Signed)
Cephas Darby, MD [address]  [place]  [place], [place] [address]  Main: [phone]  Fax: [phone]    Gastroenterology Consultation  Referring Provider:     Idelle Crouch, MD Primary Care Physician:  Idelle Crouch, MD Primary Gastroenterologist:  Dr. Cephas Darby Reason for Consultation:   Chronic GERD, dysphagia, chronic constipation        HPI:   Megan Daniels is a 69 y.o. female referred by Dr. Doy Hutching, Leonie Douglas, MD  for consultation & management of chronic GERD, dysphagia and chronic constipation.  Chronic GERD and dysphagia Patient reports that she has been experiencing heart burn for several years and has undergone Nissen's  fundoplication. Over the past 2-3 years, she has been experiencing difficulty swallowing pills, GERD, solid foods.  She had episodes of aspiration resulting in episodes of pneumonia, treated with antibiotics and steroids.  She feels that the symptoms have been progressively getting worse.  Her weight has been stable.  She reports that she has to drink lots of water in order to get the pills and food down.  She denies difficulty swallowing.  She has been on Protonix 40 mg twice daily which controls her heartburn.  Her main concern today is difficulty swallowing and aspiration events that occurred in the last few years.  She also reports epigastric burning pain.  She has a history of asthma, also has sleep apnea, wears CPAP at night  Patient had a CT abdomen 07/2019 which revealed hiatal hernia with suggestion of partial slippage of Nissen's fundoplication  Chronic constipation Patient has been taking Colace 2 pills twice daily for a long time for irregular bowel habits.  She reports that if she cannot have alcohol she does not take stool softeners.  She denies lower abdominal discomfort.  NSAIDs: None  Antiplts/Anticoagulants/Anti thrombotics: None  GI Procedures:  Colonoscopy by Dr. Vira Agar in 06/21/2017  - Diverticulosis in  the sigmoid colon. - Internal hemorrhoids. - The examination was otherwise normal. - No specimens collected.  Past Medical History:  Diagnosis Date  . Abnormal Pap smear of cervix    pos hpv-   . Acid reflux   . Anxiety   . Arthritis   . Asthma    Symbicort as needed  . Cancer ([place])    Basal Cell Skin Cancer  . Chalazion of left eye   . Chronic back pain    spondylolisthesis/stenosis  . Chronic UTI    was treated for 3 months with Macrobid and in Dec everything was fine  . Constipation   . COPD (chronic obstructive pulmonary disease) ([place])   . Diverticulosis   . Elevated serum creatinine    with nsaids  . Foot drop    right  . Genital warts    h/o  . GERD (gastroesophageal reflux disease)    takes Prevacid daily  . Headache   . History of bronchitis   . History of colon polyps   . History of hiatal hernia   . Hx of vertigo   . Hypertension    takes Losartan daily  . IBS (irritable bowel syndrome)   . Joint pain   . Palpitations    takes Metoprolol daily  . Pneumonia    hx of-early 2000's  . PONV (postoperative nausea and vomiting)    extreme nausea and vomiting -none last surgery 10/29/14  . Sarcoidosis   . Seasonal allergies    takes Claritin daily  . Shortness of breath dyspnea   .  Sleep apnea    cpap 10 yrs  . Stress incontinence   . Thyroid nodule   . URI (upper respiratory infection)    treated self with Mucinex and no fever  . Urinary urgency   . Vitamin D deficiency   . Weakness    numbness and tingling in both legs    Past Surgical History:  Procedure Laterality Date  . ABDOMINAL HYSTERECTOMY    . ANTERIOR CERVICAL DECOMP/DISCECTOMY FUSION N/A 10/06/2016   Procedure: ANTERIOR CERVICAL DECOMPRESSION/DISCECTOMY FUSION , INTERBODY PROSTHESIS, PLATE CERVICAL FOUR CERVICAL FIVE, CERVICAL SIX- CERVICAL SEVEN;  Surgeon: Newman Pies, MD;  Location: [place];  Service: Neurosurgery;  Laterality: N/A;  anterior  . BACK SURGERY      X 3  . BREAST  BIOPSY Right 1996   EXCISIONAL - NEG  . BUNIONECTOMY Bilateral   . CARPAL TUNNEL RELEASE Right    x 2  . CARPAL TUNNEL RELEASE Left 07/06/2017   Procedure: CARPAL TUNNEL RELEASE AND LEFT THUMB [place] INJECTION;  Surgeon: Hessie Knows, MD;  Location: [facility];  Service: Orthopedics;  Laterality: Left;  . cataract surgery Bilateral   . CERVICAL FUSION      X 2  . COLONOSCOPY    . COLONOSCOPY WITH PROPOFOL N/A 06/21/2017   Procedure: COLONOSCOPY WITH PROPOFOL;  Surgeon: Manya Silvas, MD;  Location: [facility];  Service: Endoscopy;  Laterality: N/A;  . ESOPHAGOGASTRODUODENOSCOPY    . EXPLORATORY LAPAROTOMY    . EYE SURGERY    . left middle finger surgery     pin- arthritic cyst  . LUMBAR LAMINECTOMY/DECOMPRESSION MICRODISCECTOMY Right 05/04/2015   Procedure: RIGHT LUMBAR TWO-THREE/THREE-FOUR LAMINECTOMY/DECOMPRESSION ;  Surgeon: Newman Pies, MD;  Location: [place] [facility];  Service: Neurosurgery;  Laterality: Right;  Right L23 L34 laminectomy and foraminotomy  . Nissan fundoplication    . TONSILLECTOMY      Current Outpatient Medications:  .  acetaminophen (TYLENOL) 500 MG tablet, Take 1,000 mg by mouth every 6 (six) hours as needed for moderate pain or headache., Disp: , Rfl:  .  albuterol (PROAIR HFA) 108 (90 BASE) MCG/ACT inhaler, Inhale 2 puffs into the lungs every 4 (four) hours as needed for wheezing or shortness of breath. , Disp: , Rfl:  .  aspirin EC 81 MG tablet, Take 1 tablet (81 mg total) by mouth daily. (Patient taking differently: Take 81 mg by mouth every evening. ), Disp: , Rfl:  .  busPIRone (BUSPAR) 10 MG tablet, Take by mouth., Disp: , Rfl:  .  carvedilol (COREG) 12.5 MG tablet, Take by mouth., Disp: , Rfl:  .  cetirizine (ZYRTEC) 10 MG tablet, Take by mouth., Disp: , Rfl:  .  Cholecalciferol (VITAMIN D) 2000 units CAPS, Take 2,000 Units by mouth daily. , Disp: , Rfl:  .  docusate sodium (COLACE) 100 MG capsule, Take 100 mg by mouth 2 (two) times daily. , Disp: ,  Rfl:  .  furosemide (LASIX) 20 MG tablet, Take by mouth., Disp: , Rfl:  .  gabapentin (NEURONTIN) 300 MG capsule, Take 300 mg by mouth at bedtime. , Disp: , Rfl:  .  irbesartan (AVAPRO) 300 MG tablet, TAKE 1 TABLET BY MOUTH DAILY, Disp: , Rfl:  .  losartan (COZAAR) 100 MG tablet, Take 100 mg by mouth at bedtime. , Disp: , Rfl:  .  magnesium oxide (MAG-OX) 400 MG tablet, Take by mouth., Disp: , Rfl:  .  metoprolol succinate (TOPROL-XL) 25 MG 24 hr tablet, Take 25 mg by mouth  2 (two) times daily. , Disp: , Rfl:  .  Multiple Vitamin (MULTIVITAMIN WITH MINERALS) TABS tablet, Take 1 tablet by mouth daily., Disp: , Rfl:  .  pantoprazole (PROTONIX) 40 MG tablet, Take 40 mg by mouth 2 (two) times daily., Disp: , Rfl:  .  senna-docusate (SENOKOT-S) 8.6-50 MG tablet, Take 1 tablet by mouth daily as needed for moderate constipation. , Disp: , Rfl:  .  traMADol (ULTRAM) 50 MG tablet, Take 25 mg by mouth daily as needed for moderate pain. , Disp: , Rfl:  .  vitamin B-12 (CYANOCOBALAMIN) 500 MCG tablet, Take 500 mcg by mouth daily. , Disp: , Rfl:  .  vitamin C (ASCORBIC ACID) 500 MG tablet, Take 500 mg by mouth daily. , Disp: , Rfl:  .  Vitamins/Minerals TABS, Take by mouth., Disp: , Rfl:  .  escitalopram (LEXAPRO) 10 MG tablet, Take 10 mg by mouth daily., Disp: , Rfl:    Family History  Problem Relation Age of Onset  . Breast cancer Mother   . Breast cancer Maternal Aunt   . Diabetes Maternal Aunt   . Hypertension Father   . Diabetes Maternal Uncle   . Ovarian cancer Neg Hx   . Colon cancer Neg Hx      Social History   Tobacco Use  . Smoking status: Never Smoker  . Smokeless tobacco: Never Used  Substance Use Topics  . Alcohol use: Yes    Comment: wine rarely  . Drug use: No    Allergies as of 11/13/2019 - Review Complete 11/13/2019  Allergen Reaction Noted  . Erythromycin base Nausea Only 10/29/2014  . Hydrocodone Nausea Only 10/29/2014  . Oxycodone-acetaminophen Itching 04/03/2015  .  Talwin [pentazocine] Nausea And Vomiting 10/29/2014  . Vicodin [hydrocodone-acetaminophen] Nausea And Vomiting 10/14/2014  . Vioxx [rofecoxib] Nausea And Vomiting 10/29/2014    Review of Systems:    All systems reviewed and negative except where noted in HPI.   Physical Exam:  BP 116/74 (BP Location: Left Arm, Patient Position: Sitting, Cuff Size: Normal)   Pulse 74   Temp 98.1 F (36.7 C) (Oral)   Wt 190 lb 4 oz (86.3 kg)   BMI 33.70 kg/m  No LMP recorded. Patient has had a hysterectomy.  General:   Alert,  Well-developed, well-nourished, pleasant and cooperative in NAD Head:  Normocephalic and atraumatic. Eyes:  Sclera clear, no icterus.   Conjunctiva pink. Ears:  Normal auditory acuity. Nose:  No deformity, discharge, or lesions. Mouth:  No deformity or lesions,oropharynx pink & moist. Neck:  Supple; no masses or thyromegaly. Lungs:  Respirations even and unlabored.  Clear throughout to auscultation.   No wheezes, crackles, or rhonchi. No acute distress. Heart:  Regular rate and rhythm; no murmurs, clicks, rubs, or gallops. Abdomen:  Normal bowel sounds. Soft, non-tender and non-distended without masses, hepatosplenomegaly or hernias noted.  No guarding or rebound tenderness.   Rectal: Not performed Msk:  Symmetrical without gross deformities. Good, equal movement & strength bilaterally. Pulses:  Normal pulses noted. Extremities:  No clubbing or edema.  No cyanosis. Neurologic:  Alert and oriented x3;  grossly normal neurologically. Skin:  Intact without significant lesions or rashes. No jaundice. Psych:  Alert and cooperative. Normal mood and affect.  Imaging Studies: Reviewed  Assessment and Plan:   Megan Daniels is a 69 y.o. female with history of obesity, obstructive sleep apnea on CPAP, asthma, chronic GERD s/p Nissen fundoplication, hypertension is seen in consultation for chronic dysphagia and chronic constipation  Chronic GERD and dysphagia Likely due to  slippage of the nissen's fundoplication causing GERD symptoms and may had developed a peptic stricture Recommend EGD for further evaluation and possible dilation Continue Protonix 40 mg twice daily, switch to Nexium based on EGD findings Discussed about antireflux lifestyle, information provided  Chronic constipation Discontinue Colace Start Linzess 145 MCG daily, samples provided High-fiber diet with fiber supplements, samples provided Adequate intake of water  Follow up in 2 months   Cephas Darby, MD

## 2019-11-13 NOTE — Patient Instructions (Signed)
High-Fiber Diet Fiber, also called dietary fiber, is a type of carbohydrate that is found in fruits, vegetables, whole grains, and beans. A high-fiber diet can have many health benefits. Your health care provider may recommend a high-fiber diet to help:  Prevent constipation. Fiber can make your bowel movements more regular.  Lower your cholesterol.  Relieve the following conditions: ? Swelling of veins in the anus (hemorrhoids). ? Swelling and irritation (inflammation) of specific areas of the digestive tract (uncomplicated diverticulosis). ? A problem of the large intestine (colon) that sometimes causes pain and diarrhea (irritable bowel syndrome, IBS).  Prevent overeating as part of a weight-loss plan.  Prevent heart disease, type 2 diabetes, and certain cancers. What is my plan? The recommended daily fiber intake in grams (g) includes:  38 g for men age 50 or younger.  30 g for men over age 50.  25 g for women age 50 or younger.  21 g for women over age 50. You can get the recommended daily intake of dietary fiber by:  Eating a variety of fruits, vegetables, grains, and beans.  Taking a fiber supplement, if it is not possible to get enough fiber through your diet. What do I need to know about a high-fiber diet?  It is better to get fiber through food sources rather than from fiber supplements. There is not a lot of research about how effective supplements are.  Always check the fiber content on the nutrition facts label of any prepackaged food. Look for foods that contain 5 g of fiber or more per serving.  Talk with a diet and nutrition specialist (dietitian) if you have questions about specific foods that are recommended or not recommended for your medical condition, especially if those foods are not listed below.  Gradually increase how much fiber you consume. If you increase your intake of dietary fiber too quickly, you may have bloating, cramping, or gas.  Drink plenty  of water. Water helps you to digest fiber. What are tips for following this plan?  Eat a wide variety of high-fiber foods.  Make sure that half of the grains that you eat each day are whole grains.  Eat breads and cereals that are made with whole-grain flour instead of refined flour or white flour.  Eat brown rice, bulgur wheat, or millet instead of white rice.  Start the day with a breakfast that is high in fiber, such as a cereal that contains 5 g of fiber or more per serving.  Use beans in place of meat in soups, salads, and pasta dishes.  Eat high-fiber snacks, such as berries, raw vegetables, nuts, and popcorn.  Choose whole fruits and vegetables instead of processed forms like juice or sauce. What foods can I eat?  Fruits Berries. Pears. Apples. Oranges. Avocado. Prunes and raisins. Dried figs. Vegetables Sweet potatoes. Spinach. Kale. Artichokes. Cabbage. Broccoli. Cauliflower. Green peas. Carrots. Squash. Grains Whole-grain breads. Multigrain cereal. Oats and oatmeal. Brown rice. Barley. Bulgur wheat. Millet. Quinoa. Bran muffins. Popcorn. Rye wafer crackers. Meats and other proteins Navy, kidney, and pinto beans. Soybeans. Split peas. Lentils. Nuts and seeds. Dairy Fiber-fortified yogurt. Beverages Fiber-fortified soy milk. Fiber-fortified orange juice. Other foods Fiber bars. The items listed above may not be a complete list of recommended foods and beverages. Contact a dietitian for more options. What foods are not recommended? Fruits Fruit juice. Cooked, strained fruit. Vegetables Fried potatoes. Canned vegetables. Well-cooked vegetables. Grains White bread. Pasta made with refined flour. White rice. Meats and other   proteins Fatty cuts of meat. Fried chicken or fried fish. Dairy Milk. Yogurt. Cream cheese. Sour cream. Fats and oils Butters. Beverages Soft drinks. Other foods Cakes and pastries. The items listed above may not be a complete list of foods  and beverages to avoid. Contact a dietitian for more information. Summary  Fiber is a type of carbohydrate. It is found in fruits, vegetables, whole grains, and beans.  There are many health benefits of eating a high-fiber diet, such as preventing constipation, lowering blood cholesterol, helping with weight loss, and reducing your risk of heart disease, diabetes, and certain cancers.  Gradually increase your intake of fiber. Increasing too fast can result in cramping, bloating, and gas. Drink plenty of water while you increase your fiber.  The best sources of fiber include whole fruits and vegetables, whole grains, nuts, seeds, and beans. This information is not intended to replace advice given to you by your health care provider. Make sure you discuss any questions you have with your health care provider. Document Revised: 06/26/2017 Document Reviewed: 06/26/2017 Elsevier Patient Education  2020 [place] for Gastroesophageal Reflux Disease, Adult When you have gastroesophageal reflux disease (GERD), the foods you eat and your eating habits are very important. Choosing the right foods can help ease your discomfort. Think about working with a nutrition specialist (dietitian) to help you make good choices. What are tips for following this plan?  Meals  Choose healthy foods that are low in fat, such as fruits, vegetables, whole grains, low-fat dairy products, and lean meat, fish, and poultry.  Eat small meals often instead of 3 large meals a day. Eat your meals slowly, and in a place where you are relaxed. Avoid bending over or lying down until 2-3 hours after eating.  Avoid eating meals 2-3 hours before bed.  Avoid drinking a lot of liquid with meals.  Cook foods using methods other than frying. Bake, grill, or broil food instead.  Avoid or limit: ? Chocolate. ? Peppermint or spearmint. ? Alcohol. ? Pepper. ? Black and decaffeinated coffee. ? Black and  decaffeinated tea. ? Bubbly (carbonated) soft drinks. ? Caffeinated energy drinks and soft drinks.  Limit high-fat foods such as: ? Fatty meat or fried foods. ? Whole milk, cream, butter, or ice cream. ? Nuts and nut butters. ? Pastries, donuts, and sweets made with butter or shortening.  Avoid foods that cause symptoms. These foods may be different for everyone. Common foods that cause symptoms include: ? Tomatoes. ? Oranges, lemons, and limes. ? Peppers. ? Spicy food. ? Onions and garlic. ? Vinegar. Lifestyle  Maintain a healthy weight. Ask your doctor what weight is healthy for you. If you need to lose weight, work with your doctor to do so safely.  Exercise for at least 30 minutes for 5 or more days each week, or as told by your doctor.  Wear loose-fitting clothes.  Do not smoke. If you need help quitting, ask your doctor.  Sleep with the head of your bed higher than your feet. Use a wedge under the mattress or blocks under the bed frame to raise the head of the bed. Summary  When you have gastroesophageal reflux disease (GERD), food and lifestyle choices are very important in easing your symptoms.  Eat small meals often instead of 3 large meals a day. Eat your meals slowly, and in a place where you are relaxed.  Limit high-fat foods such as fatty meat or fried foods.  Avoid bending over or  lying down until 2-3 hours after eating.  Avoid peppermint and spearmint, caffeine, alcohol, and chocolate. This information is not intended to replace advice given to you by your health care provider. Make sure you discuss any questions you have with your health care provider. Document Revised: 12/13/2018 Document Reviewed: 09/27/2016 Elsevier Patient Education  [place].

## 2019-11-19 ENCOUNTER — Other Ambulatory Visit
Admission: RE | Admit: 2019-11-19 | Discharge: 2019-11-19 | Disposition: A | Discharge status: Home / Self Care | Payer: Medicare Other | Source: Ambulatory Visit | Attending: Gastroenterology | Admitting: Gastroenterology

## 2019-11-19 DIAGNOSIS — Z01812 Encounter for preprocedural laboratory examination: Secondary | ICD-10-CM | POA: Insufficient documentation

## 2019-11-19 DIAGNOSIS — Z20822 Contact with and (suspected) exposure to covid-19: Secondary | ICD-10-CM | POA: Insufficient documentation

## 2019-11-19 LAB — SARS CORONAVIRUS 2 (TAT 6-24 HRS): SARS Coronavirus 2: NEGATIVE

## 2019-11-21 ENCOUNTER — Ambulatory Visit: Payer: Medicare Other | Admitting: Certified Registered Nurse Anesthetist

## 2019-11-21 ENCOUNTER — Encounter
Admission: RE | Disposition: A | Discharge status: Home / Self Care | Payer: Self-pay | Source: Home / Self Care | Attending: Gastroenterology

## 2019-11-21 ENCOUNTER — Other Ambulatory Visit: Payer: Self-pay

## 2019-11-21 ENCOUNTER — Ambulatory Visit
Admission: RE | Admit: 2019-11-21 | Discharge: 2019-11-21 | Disposition: A | Discharge status: Home / Self Care | Payer: Medicare Other | Attending: Gastroenterology | Admitting: Gastroenterology

## 2019-11-21 ENCOUNTER — Encounter: Payer: Self-pay | Admitting: Gastroenterology

## 2019-11-21 DIAGNOSIS — R002 Palpitations: Secondary | ICD-10-CM | POA: Diagnosis not present

## 2019-11-21 DIAGNOSIS — Z85828 Personal history of other malignant neoplasm of skin: Secondary | ICD-10-CM | POA: Insufficient documentation

## 2019-11-21 DIAGNOSIS — I10 Essential (primary) hypertension: Secondary | ICD-10-CM | POA: Insufficient documentation

## 2019-11-21 DIAGNOSIS — Z79899 Other long term (current) drug therapy: Secondary | ICD-10-CM | POA: Insufficient documentation

## 2019-11-21 DIAGNOSIS — E559 Vitamin D deficiency, unspecified: Secondary | ICD-10-CM | POA: Diagnosis not present

## 2019-11-21 DIAGNOSIS — K5900 Constipation, unspecified: Secondary | ICD-10-CM | POA: Diagnosis not present

## 2019-11-21 DIAGNOSIS — R1310 Dysphagia, unspecified: Secondary | ICD-10-CM

## 2019-11-21 DIAGNOSIS — G4730 Sleep apnea, unspecified: Secondary | ICD-10-CM | POA: Diagnosis not present

## 2019-11-21 DIAGNOSIS — F419 Anxiety disorder, unspecified: Secondary | ICD-10-CM | POA: Diagnosis not present

## 2019-11-21 DIAGNOSIS — R1314 Dysphagia, pharyngoesophageal phase: Secondary | ICD-10-CM | POA: Diagnosis not present

## 2019-11-21 DIAGNOSIS — K219 Gastro-esophageal reflux disease without esophagitis: Secondary | ICD-10-CM | POA: Diagnosis not present

## 2019-11-21 DIAGNOSIS — Z7982 Long term (current) use of aspirin: Secondary | ICD-10-CM | POA: Insufficient documentation

## 2019-11-21 DIAGNOSIS — J449 Chronic obstructive pulmonary disease, unspecified: Secondary | ICD-10-CM | POA: Diagnosis not present

## 2019-11-21 DIAGNOSIS — K317 Polyp of stomach and duodenum: Secondary | ICD-10-CM | POA: Diagnosis not present

## 2019-11-21 DIAGNOSIS — Z9889 Other specified postprocedural states: Secondary | ICD-10-CM | POA: Insufficient documentation

## 2019-11-21 DIAGNOSIS — J45909 Unspecified asthma, uncomplicated: Secondary | ICD-10-CM | POA: Insufficient documentation

## 2019-11-21 HISTORY — PX: ESOPHAGOGASTRODUODENOSCOPY (EGD) WITH PROPOFOL: SHX5813

## 2019-11-21 SURGERY — ESOPHAGOGASTRODUODENOSCOPY (EGD) WITH PROPOFOL
Anesthesia: General

## 2019-11-21 MED ORDER — LIDOCAINE HCL (CARDIAC) PF 100 MG/5ML IV SOSY
PREFILLED_SYRINGE | INTRAVENOUS | Status: DC | PRN
  Administered 2019-11-21: 50 mg via INTRAVENOUS

## 2019-11-21 MED ORDER — PROPOFOL 500 MG/50ML IV EMUL
INTRAVENOUS | Status: DC | PRN
  Administered 2019-11-21: 150 ug/kg/min via INTRAVENOUS
  Administered 2019-11-21: 50 mg via INTRAVENOUS

## 2019-11-21 MED ORDER — PROPOFOL 10 MG/ML IV BOLUS
INTRAVENOUS | Status: AC
  Filled 2019-11-21: qty 20

## 2019-11-21 MED ORDER — PROPOFOL 500 MG/50ML IV EMUL
INTRAVENOUS | Status: AC
  Filled 2019-11-21: qty 50

## 2019-11-21 MED ORDER — OMEPRAZOLE 40 MG PO CPDR: 40.0000 mg | DELAYED_RELEASE_CAPSULE | Freq: Every day | ORAL | 0 refills | Status: DC

## 2019-11-21 MED ORDER — SODIUM CHLORIDE 0.9 % IV SOLN
INTRAVENOUS | Status: DC
  Administered 2019-11-21: 1000 mL via INTRAVENOUS

## 2019-11-21 NOTE — Op Note (Signed)
[facility] Gastroenterology Patient Name: Megan Daniels Procedure Date: 11/21/2019 9:37 AM MRN: [id number] Account #: [id number] Date of Birth: February 28, 1951 Admit Type: Outpatient Age: 69 Room: [facility] ENDO ROOM 3 Gender: Female Note Status: Finalized Procedure:             Upper GI endoscopy Indications:           Esophageal dysphagia Providers:             Lin Landsman MD, MD Referring MD:          Leonie Douglas. Doy Hutching, MD (Referring MD) Medicines:             Monitored Anesthesia Care Complications:         No immediate complications. Estimated blood loss: None. Procedure:             Pre-Anesthesia Assessment:                        - Prior to the procedure, a History and Physical was                         performed, and patient medications and allergies were                         reviewed. The patient is competent. The risks and                         benefits of the procedure and the sedation options and                         risks were discussed with the patient. All questions                         were answered and informed consent was obtained.                         Patient identification and proposed procedure were                         verified by the physician, the nurse, the                         anesthesiologist, the anesthetist and the technician                         in the pre-procedure area in the procedure room in the                         endoscopy suite. Mental Status Examination: alert and                         oriented. Airway Examination: normal oropharyngeal                         airway and neck mobility. Respiratory Examination:                         clear to auscultation. CV Examination: normal.  Prophylactic Antibiotics: The patient does not require                         prophylactic antibiotics. Prior Anticoagulants: The                         patient has taken no previous anticoagulant  or                         antiplatelet agents. ASA Grade Assessment: III - A                         patient with severe systemic disease. After reviewing                         the risks and benefits, the patient was deemed in                         satisfactory condition to undergo the procedure. The                         anesthesia plan was to use monitored anesthesia care                         (MAC). Immediately prior to administration of                         medications, the patient was re-assessed for adequacy                         to receive sedatives. The heart rate, respiratory                         rate, oxygen saturations, blood pressure, adequacy of                         pulmonary ventilation, and response to care were                         monitored throughout the procedure. The physical                         status of the patient was re-assessed after the                         procedure.                        After obtaining informed consent, the endoscope was                         passed under direct vision. Throughout the procedure,                         the patient's blood pressure, pulse, and oxygen                         saturations were monitored continuously. The Endoscope  was introduced through the mouth, and advanced to the                         second part of duodenum. The upper GI endoscopy was                         accomplished without difficulty. The patient tolerated                         the procedure poorly due to the patient's respiratory                         instability (hypoxia). Findings:      The duodenal bulb and second portion of the duodenum were normal.      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       Evidence of a Nissen fundoplication was found in the cardia. The wrap       appeared intact. This was traversed. Impression:            - Normal duodenal bulb and second portion of the                         duodenum.                        - Multiple gastric polyps.                        - A Nissen fundoplication was found. The wrap appears                         intact.                        - No specimens collected. Recommendation:        - Discharge patient to home (with escort).                        - Chopped diet.                        - Continue present medications. Procedure Code(s):     --- Professional ---                        [phone], Esophagogastroduodenoscopy, flexible,                         transoral; diagnostic, including collection of                         specimen(s) by brushing or washing, when performed                         (separate procedure) Diagnosis Code(s):     --- Professional ---  K31.7, Polyp of stomach and duodenum                        Z98.890, Other specified postprocedural states                        R13.14, Dysphagia, pharyngoesophageal phase CPT copyright 2019 American Medical Association. All rights reserved. The codes documented in this report are preliminary and upon coder review may  be revised to meet current compliance requirements. Dr. Ulyess Mort Lin Landsman MD, MD 11/21/2019 9:57:00 AM This report has been signed electronically. Number of Addenda: 0 Note Initiated On: 11/21/2019 9:37 AM Estimated Blood Loss:  Estimated blood loss: none.      [facility]

## 2019-11-21 NOTE — Anesthesia Preprocedure Evaluation (Signed)
Anesthesia Evaluation  Patient identified by MRN, date of birth, ID band Patient awake    Reviewed: Allergy & Precautions, H&P , NPO status , Patient's Chart, lab work & pertinent test results  History of Anesthesia Complications (+) PONV and history of anesthetic complications  Airway Mallampati: III  TM Distance: <3 FB Neck ROM: limited    Dental  (+) Chipped, Poor Dentition   Pulmonary shortness of breath and with exertion, asthma , sleep apnea , pneumonia, COPD,           Cardiovascular Exercise Tolerance: Good hypertension, (-) angina(-) Past MI      Neuro/Psych  Headaches, PSYCHIATRIC DISORDERS negative neurological ROS     GI/Hepatic Neg liver ROS, hiatal hernia, GERD  Medicated and Controlled,  Endo/Other  negative endocrine ROS  Renal/GU Renal disease  negative genitourinary   Musculoskeletal  (+) Arthritis ,   Abdominal   Peds  Hematology negative hematology ROS (+)   Anesthesia Other Findings Past Medical History: No date: Abnormal Pap smear of cervix     Comment:  pos hpv-  No date: Acid reflux No date: Anxiety No date: Arthritis No date: Asthma     Comment:  Symbicort as needed No date: Cancer ([place])     Comment:  Basal Cell Skin Cancer No date: Chalazion of left eye No date: Chronic back pain     Comment:  spondylolisthesis/stenosis No date: Chronic UTI     Comment:  was treated for 3 months with Macrobid and in Dec               everything was fine No date: Constipation No date: COPD (chronic obstructive pulmonary disease) (HCC) No date: Diverticulosis No date: Elevated serum creatinine     Comment:  with nsaids No date: Foot drop     Comment:  right No date: Genital warts     Comment:  h/o No date: GERD (gastroesophageal reflux disease)     Comment:  takes Prevacid daily No date: Headache No date: History of bronchitis No date: History of colon polyps No date: History of hiatal  hernia No date: Hx of vertigo No date: Hypertension     Comment:  takes Losartan daily No date: IBS (irritable bowel syndrome) No date: Joint pain No date: Palpitations     Comment:  takes Metoprolol daily No date: Pneumonia     Comment:  hx of-early 2000's No date: PONV (postoperative nausea and vomiting)     Comment:  extreme nausea and vomiting -none last surgery 10/29/14 No date: Sarcoidosis No date: Seasonal allergies     Comment:  takes Claritin daily No date: Shortness of breath dyspnea No date: Sleep apnea     Comment:  cpap 10 yrs No date: Stress incontinence No date: Thyroid nodule No date: URI (upper respiratory infection)     Comment:  treated self with Mucinex and no fever No date: Urinary urgency No date: Vitamin D deficiency No date: Weakness     Comment:  numbness and tingling in both legs  Past Surgical History: No date: ABDOMINAL HYSTERECTOMY 10/06/2016: ANTERIOR CERVICAL DECOMP/DISCECTOMY FUSION; N/A     Comment:  Procedure: ANTERIOR CERVICAL DECOMPRESSION/DISCECTOMY               FUSION , INTERBODY PROSTHESIS, PLATE CERVICAL FOUR               CERVICAL FIVE, CERVICAL SIX- CERVICAL SEVEN;  Surgeon:  Newman Pies, MD;  Location: [facility];  Service:               Neurosurgery;  Laterality: N/A;  anterior No date: BACK SURGERY     Comment:   X 3 1996: BREAST BIOPSY; Right     Comment:  EXCISIONAL - NEG No date: BUNIONECTOMY; Bilateral No date: CARPAL TUNNEL RELEASE; Right     Comment:  x 2 07/06/2017: CARPAL TUNNEL RELEASE; Left     Comment:  Procedure: CARPAL TUNNEL RELEASE AND LEFT THUMB [place]               INJECTION;  Surgeon: Hessie Knows, MD;  Location: [facility]               ORS;  Service: Orthopedics;  Laterality: Left; No date: cataract surgery; Bilateral No date: CERVICAL FUSION     Comment:   X 2 No date: COLONOSCOPY 06/21/2017: COLONOSCOPY WITH PROPOFOL; N/A     Comment:  Procedure: COLONOSCOPY WITH PROPOFOL;  Surgeon: Manya Silvas, MD;  Location: [facility] ENDOSCOPY;  Service:               Endoscopy;  Laterality: N/A; No date: ESOPHAGOGASTRODUODENOSCOPY No date: EXPLORATORY LAPAROTOMY No date: EYE SURGERY No date: left middle finger surgery     Comment:  pin- arthritic cyst 05/04/2015: LUMBAR LAMINECTOMY/DECOMPRESSION MICRODISCECTOMY; Right     Comment:  Procedure: RIGHT LUMBAR TWO-THREE/THREE-FOUR               LAMINECTOMY/DECOMPRESSION ;  Surgeon: Newman Pies,               MD;  Location: [facility] NEURO ORS;  Service: Neurosurgery;                Laterality: Right;  Right L23 L34 laminectomy and               foraminotomy No date: Nissan fundoplication No date: TONSILLECTOMY  BMI    Body Mass Index: 33.84 kg/m      Reproductive/Obstetrics negative OB ROS                             Anesthesia Physical Anesthesia Plan  ASA: III  Anesthesia Plan: General   Post-op Pain Management:    Induction: Intravenous  PONV Risk Score and Plan: Propofol infusion and TIVA  Airway Management Planned: Natural Airway and Nasal Cannula  Additional Equipment:   Intra-op Plan:   Post-operative Plan:   Informed Consent: I have reviewed the patients History and Physical, chart, labs and discussed the procedure including the risks, benefits and alternatives for the proposed anesthesia with the patient or authorized representative who has indicated his/her understanding and acceptance.     Dental Advisory Given  Plan Discussed with: Anesthesiologist, CRNA and Surgeon  Anesthesia Plan Comments: (Patient consented for risks of anesthesia including but not limited to:  - adverse reactions to medications - risk of intubation if required - damage to teeth, lips or other oral mucosa - sore throat or hoarseness - Damage to heart, brain, lungs or loss of life  Patient voiced understanding.)        Anesthesia Quick Evaluation

## 2019-11-21 NOTE — Anesthesia Postprocedure Evaluation (Signed)
Anesthesia Post Note  Patient: Megan Daniels  Procedure(s) Performed: ESOPHAGOGASTRODUODENOSCOPY (EGD) WITH PROPOFOL (N/A )  Patient location during evaluation: Endoscopy Anesthesia Type: General Level of consciousness: awake and alert Pain management: pain level controlled Vital Signs Assessment: post-procedure vital signs reviewed and stable Respiratory status: spontaneous breathing, nonlabored ventilation, respiratory function stable and patient connected to nasal cannula oxygen Cardiovascular status: blood pressure returned to baseline and stable Postop Assessment: no apparent nausea or vomiting Anesthetic complications: no     Last Vitals:  Vitals:   11/21/19 1017 11/21/19 1027  BP: (!) 137/99 (!) 152/87  Pulse: 69 66  Resp: 18 19  Temp:    SpO2: 97% 97%    Last Pain:  Vitals:   11/21/19 1027  TempSrc:   PainSc: 0-No pain                 Precious Haws Cindie Rajagopalan

## 2019-11-21 NOTE — H&P (Signed)
Cephas Darby, MD [address]  [place]  [place], [place] [address]  Main: [phone]  Fax: [phone] Pager: [phone]  Primary Care Physician:  Idelle Crouch, MD Primary Gastroenterologist:  Dr. Cephas Darby  Pre-Procedure History & Physical: HPI:  Megan Daniels is a 69 y.o. female is here for an endoscopy.   Past Medical History:  Diagnosis Date  . Abnormal Pap smear of cervix    pos hpv-   . Acid reflux   . Anxiety   . Arthritis   . Asthma    Symbicort as needed  . Cancer ([place])    Basal Cell Skin Cancer  . Chalazion of left eye   . Chronic back pain    spondylolisthesis/stenosis  . Chronic UTI    was treated for 3 months with Macrobid and in Dec everything was fine  . Constipation   . COPD (chronic obstructive pulmonary disease) ([place])   . Diverticulosis   . Elevated serum creatinine    with nsaids  . Foot drop    right  . Genital warts    h/o  . GERD (gastroesophageal reflux disease)    takes Prevacid daily  . Headache   . History of bronchitis   . History of colon polyps   . History of hiatal hernia   . Hx of vertigo   . Hypertension    takes Losartan daily  . IBS (irritable bowel syndrome)   . Joint pain   . Palpitations    takes Metoprolol daily  . Pneumonia    hx of-early 2000's  . PONV (postoperative nausea and vomiting)    extreme nausea and vomiting -none last surgery 10/29/14  . Sarcoidosis   . Seasonal allergies    takes Claritin daily  . Shortness of breath dyspnea   . Sleep apnea    cpap 10 yrs  . Stress incontinence   . Thyroid nodule   . URI (upper respiratory infection)    treated self with Mucinex and no fever  . Urinary urgency   . Vitamin D deficiency   . Weakness    numbness and tingling in both legs    Past Surgical History:  Procedure Laterality Date  . ABDOMINAL HYSTERECTOMY    . ANTERIOR CERVICAL DECOMP/DISCECTOMY FUSION N/A 10/06/2016   Procedure: ANTERIOR CERVICAL DECOMPRESSION/DISCECTOMY  FUSION , INTERBODY PROSTHESIS, PLATE CERVICAL FOUR CERVICAL FIVE, CERVICAL SIX- CERVICAL SEVEN;  Surgeon: Newman Pies, MD;  Location: [facility];  Service: Neurosurgery;  Laterality: N/A;  anterior  . BACK SURGERY      X 3  . BREAST BIOPSY Right 1996   EXCISIONAL - NEG  . BUNIONECTOMY Bilateral   . CARPAL TUNNEL RELEASE Right    x 2  . CARPAL TUNNEL RELEASE Left 07/06/2017   Procedure: CARPAL TUNNEL RELEASE AND LEFT THUMB [place] INJECTION;  Surgeon: Hessie Knows, MD;  Location: [facility];  Service: Orthopedics;  Laterality: Left;  . cataract surgery Bilateral   . CERVICAL FUSION      X 2  . COLONOSCOPY    . COLONOSCOPY WITH PROPOFOL N/A 06/21/2017   Procedure: COLONOSCOPY WITH PROPOFOL;  Surgeon: Manya Silvas, MD;  Location: [facility] ENDOSCOPY;  Service: Endoscopy;  Laterality: N/A;  . ESOPHAGOGASTRODUODENOSCOPY    . EXPLORATORY LAPAROTOMY    . EYE SURGERY    . left middle finger surgery     pin- arthritic cyst  . LUMBAR LAMINECTOMY/DECOMPRESSION MICRODISCECTOMY Right 05/04/2015   Procedure: RIGHT LUMBAR TWO-THREE/THREE-FOUR LAMINECTOMY/DECOMPRESSION ;  Surgeon: Newman Pies, MD;  [facility];  Service: Neurosurgery;  Laterality: Right;  Right L23 L34 laminectomy and foraminotomy  . Nissan fundoplication    . TONSILLECTOMY      Prior to Admission medications   Medication Sig Start Date End Date Taking? Authorizing Provider  acetaminophen (TYLENOL) 500 MG tablet Take 1,000 mg by mouth every 6 (six) hours as needed for moderate pain or headache.   Yes [provider]  albuterol (PROAIR HFA) 108 (90 BASE) MCG/ACT inhaler Inhale 2 puffs into the lungs every 4 (four) hours as needed for wheezing or shortness of breath.    Yes [provider]  aspirin EC 81 MG tablet Take 1 tablet (81 mg total) by mouth daily. Patient taking differently: Take 81 mg by mouth every evening.  11/14/14  Yes Consuella Lose, MD  busPIRone (BUSPAR) 10 MG tablet Take by mouth.  07/17/19 07/16/20 Yes [provider]  carvedilol (COREG) 12.5 MG tablet Take by mouth. 02/06/19 02/06/20 Yes [provider]  cetirizine (ZYRTEC) 10 MG tablet Take by mouth.   Yes [provider]  Cholecalciferol (VITAMIN D) 2000 units CAPS Take 2,000 Units by mouth daily.    Yes [provider]  docusate sodium (COLACE) 100 MG capsule Take 100 mg by mouth 2 (two) times daily.    Yes [provider]  escitalopram (LEXAPRO) 10 MG tablet Take 10 mg by mouth daily.   Yes [provider]  furosemide (LASIX) 20 MG tablet Take by mouth. 02/20/19 02/20/20 Yes [provider]  gabapentin (NEURONTIN) 300 MG capsule Take 300 mg by mouth at bedtime.    Yes [provider]  irbesartan (AVAPRO) 300 MG tablet TAKE 1 TABLET BY MOUTH DAILY 06/26/19  Yes [provider]  losartan (COZAAR) 100 MG tablet Take 100 mg by mouth at bedtime.    Yes [provider]  magnesium oxide (MAG-OX) 400 MG tablet Take by mouth.   Yes [provider]  metoprolol succinate (TOPROL-XL) 25 MG 24 hr tablet Take 25 mg by mouth 2 (two) times daily.    Yes [provider]  Multiple Vitamin (MULTIVITAMIN WITH MINERALS) TABS tablet Take 1 tablet by mouth daily.   Yes [provider]  pantoprazole (PROTONIX) 40 MG tablet Take 40 mg by mouth 2 (two) times daily.   Yes [provider]  senna-docusate (SENOKOT-S) 8.6-50 MG tablet Take 1 tablet by mouth daily as needed for moderate constipation.    Yes [provider]  vitamin B-12 (CYANOCOBALAMIN) 500 MCG tablet Take 500 mcg by mouth daily.    Yes [provider]  vitamin C (ASCORBIC ACID) 500 MG tablet Take 500 mg by mouth daily.    Yes [provider]  Vitamins/Minerals TABS Take by mouth.   Yes [provider]  traMADol (ULTRAM) 50 MG tablet Take 25 mg by mouth daily as needed for moderate pain.     [provider]     Allergies as of 11/14/2019 - Review Complete 11/13/2019  Allergen Reaction Noted  . Erythromycin base Nausea Only 10/29/2014  . Hydrocodone Nausea Only 10/29/2014  . Oxycodone-acetaminophen Itching 04/03/2015  . Talwin [pentazocine] Nausea And Vomiting 10/29/2014  . Vicodin [hydrocodone-acetaminophen] Nausea And Vomiting 10/14/2014  . Vioxx [rofecoxib] Nausea And Vomiting 10/29/2014    Family History  Problem Relation Age of Onset  . Breast cancer Mother   . Breast cancer Maternal Aunt   . Diabetes Maternal Aunt   . Hypertension Father   .  Diabetes Maternal Uncle   . Ovarian cancer Neg Hx   . Colon cancer Neg Hx     Social History   Socioeconomic History  . Marital status: Married    Spouse name: Not on file  . Number of children: Not on file  . Years of education: Not on file  . Highest education level: Not on file  Occupational History  . Not on file  Tobacco Use  . Smoking status: Never Smoker  . Smokeless tobacco: Never Used  Substance and Sexual Activity  . Alcohol use: Yes    Comment: wine rarely  . Drug use: No  . Sexual activity: Not Currently    Birth control/protection: Surgical  Other Topics Concern  . Not on file  Social History Narrative  . Not on file   Social Determinants of Health   Financial Resource Strain:   . Difficulty of Paying Living Expenses:   Food Insecurity:   . Worried About Charity fundraiser in the Last Year:   . Arboriculturist in the Last Year:   Transportation Needs:   . Film/video editor (Medical):   Marland Kitchen Lack of Transportation (Non-Medical):   Physical Activity:   . Days of Exercise per Week:   . Minutes of Exercise per Session:   Stress:   . Feeling of Stress :   Social Connections:   . Frequency of Communication with Friends and Family:   . Frequency of Social Gatherings with Friends and Family:   . Attends Religious Services:   . Active Member of Clubs or Organizations:   . Attends Archivist  Meetings:   Marland Kitchen Marital Status:   Intimate Partner Violence:   . Fear of Current or Ex-Partner:   . Emotionally Abused:   Marland Kitchen Physically Abused:   . Sexually Abused:     Review of Systems: See HPI, otherwise negative ROS  Physical Exam: BP (!) 149/78   Pulse 66   Temp (!) 97.2 F (36.2 C) (Temporal)   Resp 20   Ht 5' 2.5" (1.588 m)   Wt 85.3 kg   SpO2 97%   BMI 33.84 kg/m  General:   Alert,  pleasant and cooperative in NAD Head:  Normocephalic and atraumatic. Neck:  Supple; no masses or thyromegaly. Lungs:  Clear throughout to auscultation.    Heart:  Regular rate and rhythm. Abdomen:  Soft, nontender and nondistended. Normal bowel sounds, without guarding, and without rebound.   Neurologic:  Alert and  oriented x4;  grossly normal neurologically.  Impression/Plan: Megan Daniels is here for an endoscopy to be performed for chronic GERD and dysphagia  Risks, benefits, limitations, and alternatives regarding  endoscopy have been reviewed with the patient.  Questions have been answered.  All parties agreeable.   Sherri Sear, MD  11/21/2019, 9:44 AM

## 2019-11-21 NOTE — Transfer of Care (Signed)
Immediate Anesthesia Transfer of Care Note  Patient: Megan Daniels  Procedure(s) Performed: ESOPHAGOGASTRODUODENOSCOPY (EGD) WITH PROPOFOL (N/A )  Patient Location: PACU  Anesthesia Type:General  Level of Consciousness: awake, alert  and oriented  Airway & Oxygen Therapy: Patient Spontanous Breathing  Post-op Assessment: Report given to RN and Post -op Vital signs reviewed and stable  Post vital signs: Reviewed and stable  Last Vitals:  Vitals Value Taken Time  BP 113/64 11/21/19 0957  Temp 36.4 C 11/21/19 0957  Pulse 66 11/21/19 0959  Resp 18 11/21/19 0959  SpO2 93 % 11/21/19 0959  Vitals shown include unvalidated device data.  Last Pain:  Vitals:   11/21/19 0957  TempSrc: Temporal  PainSc: Asleep         Complications: No apparent anesthesia complications

## 2019-11-22 ENCOUNTER — Encounter: Payer: Self-pay | Admitting: *Deleted

## 2019-12-02 ENCOUNTER — Telehealth: Payer: Self-pay

## 2019-12-02 ENCOUNTER — Other Ambulatory Visit: Payer: Self-pay

## 2019-12-02 MED ORDER — OMEPRAZOLE 40 MG PO CPDR: 40.0000 mg | DELAYED_RELEASE_CAPSULE | Freq: Every day | ORAL | 0 refills | Status: DC

## 2019-12-02 NOTE — Telephone Encounter (Signed)
Prescription for Omeprazole (prilosec) has been sent to [facility] per request.

## 2019-12-02 NOTE — Telephone Encounter (Signed)
Patient hasn't received Prilosec. [facility]'t [facility]. Told patient the medication was sent on 3/18 @10am .  Patient has new pharmacy. Please re-send medications to [facility] in [place]. Thanks

## 2019-12-16 ENCOUNTER — Other Ambulatory Visit: Payer: Self-pay

## 2019-12-16 MED ORDER — OMEPRAZOLE 40 MG PO CPDR: 40.0000 mg | DELAYED_RELEASE_CAPSULE | Freq: Every day | ORAL | 1 refills | Status: DC

## 2019-12-16 NOTE — Telephone Encounter (Signed)
Last office visit 11/13/2019 chronic gerd  Last refill today

## 2019-12-23 ENCOUNTER — Encounter: Payer: Self-pay | Admitting: Gastroenterology

## 2020-01-29 ENCOUNTER — Other Ambulatory Visit: Payer: Self-pay

## 2020-01-29 ENCOUNTER — Ambulatory Visit (INDEPENDENT_AMBULATORY_CARE_PROVIDER_SITE_OTHER): Payer: Medicare Other | Admitting: Gastroenterology

## 2020-01-29 ENCOUNTER — Encounter: Payer: Self-pay | Admitting: Gastroenterology

## 2020-01-29 VITALS — BP 123/79 | HR 61 | Temp 98.5°F | Wt 191.1 lb

## 2020-01-29 DIAGNOSIS — R1319 Other dysphagia: Secondary | ICD-10-CM

## 2020-01-29 DIAGNOSIS — K219 Gastro-esophageal reflux disease without esophagitis: Secondary | ICD-10-CM | POA: Diagnosis not present

## 2020-01-29 DIAGNOSIS — R1310 Dysphagia, unspecified: Secondary | ICD-10-CM

## 2020-01-29 NOTE — Progress Notes (Signed)
Cephas Darby, MD [address]  [place]  [place], [place] [address]  Main: [phone]  Fax: [phone]    Gastroenterology Consultation  Referring Provider:     Idelle Crouch, MD Primary Care Physician:  Idelle Crouch, MD Primary Gastroenterologist:  Dr. Cephas Darby Reason for Consultation:   Chronic GERD, dysphagia, chronic constipation        HPI:   Megan Daniels is a 69 y.o. female referred by Dr. Doy Hutching, Leonie Douglas, MD  for consultation & management of chronic GERD, dysphagia and chronic constipation.  Chronic GERD and dysphagia Patient reports that she has been experiencing heart burn for several years and has undergone Nissen's  fundoplication. Over the past 2-3 years, she has been experiencing difficulty swallowing pills, GERD, solid foods.  She had episodes of aspiration resulting in episodes of pneumonia, treated with antibiotics and steroids.  She feels that the symptoms have been progressively getting worse.  Her weight has been stable.  She reports that she has to drink lots of water in order to get the pills and food down.  She denies difficulty swallowing.  She has been on Protonix 40 mg twice daily which controls her heartburn.  Her main concern today is difficulty swallowing and aspiration events that occurred in the last few years.  She also reports epigastric burning pain.  She has a history of asthma, also has sleep apnea, wears CPAP at night  Patient had a CT abdomen 07/2019 which revealed hiatal hernia with suggestion of partial slippage of Nissen's fundoplication  Chronic constipation Patient has been taking Colace 2 pills twice daily for a long time for irregular bowel habits.  She reports that if she cannot have alcohol she does not take stool softeners.  She denies lower abdominal discomfort.  Follow-up visit 01/29/2020 Patient underwent upper endoscopy which revealed intact Nissen's fundoplication, empiric dilation was not due to  patient's respiratory distress during the procedure.  She is currently on omeprazole 40 mg daily before breakfast.  She does report nocturnal heartburn and continues to have sensation of food stuck in her upper throat.  She also reported cervical spine surgeries which she thinks is probably contributing to her upper GI symptoms.   NSAIDs: None  Antiplts/Anticoagulants/Anti thrombotics: None  GI Procedures:  EGD 11/21/2019 - Normal duodenal bulb and second portion of the duodenum. - Multiple gastric polyps. - A Nissen fundoplication was found. The wrap appears intact. - No specimens collected.  Colonoscopy by Dr. Vira Agar in 06/21/2017  - Diverticulosis in the sigmoid colon. - Internal hemorrhoids. - The examination was otherwise normal. - No specimens collected.  Past Medical History:  Diagnosis Date  . Abnormal Pap smear of cervix    pos hpv-   . Acid reflux   . Anxiety   . Arthritis   . Asthma    Symbicort as needed  . Cancer ([place])    Basal Cell Skin Cancer  . Chalazion of left eye   . Chronic back pain    spondylolisthesis/stenosis  . Chronic UTI    was treated for 3 months with Macrobid and in Dec everything was fine  . Constipation   . COPD (chronic obstructive pulmonary disease) ([place])   . Diverticulosis   . Elevated serum creatinine    with nsaids  . Foot drop    right  . Genital warts    h/o  . GERD (gastroesophageal reflux disease)    takes Prevacid daily  . Headache   .  History of bronchitis   . History of colon polyps   . History of hiatal hernia   . Hx of vertigo   . Hypertension    takes Losartan daily  . IBS (irritable bowel syndrome)   . Joint pain   . Palpitations    takes Metoprolol daily  . Pneumonia    hx of-early 2000's  . PONV (postoperative nausea and vomiting)    extreme nausea and vomiting -none last surgery 10/29/14  . Sarcoidosis   . Seasonal allergies    takes Claritin daily  . Shortness of breath dyspnea   . Sleep apnea    cpap  10 yrs  . Stress incontinence   . Thyroid nodule   . URI (upper respiratory infection)    treated self with Mucinex and no fever  . Urinary urgency   . Vitamin D deficiency   . Weakness    numbness and tingling in both legs    Past Surgical History:  Procedure Laterality Date  . ABDOMINAL HYSTERECTOMY    . ANTERIOR CERVICAL DECOMP/DISCECTOMY FUSION N/A 10/06/2016   Procedure: ANTERIOR CERVICAL DECOMPRESSION/DISCECTOMY FUSION , INTERBODY PROSTHESIS, PLATE CERVICAL FOUR CERVICAL FIVE, CERVICAL SIX- CERVICAL SEVEN;  Surgeon: Newman Pies, MD;  Location: [facility];  Service: Neurosurgery;  Laterality: N/A;  anterior  . BACK SURGERY      X 3  . BREAST BIOPSY Right 1996   EXCISIONAL - NEG  . BUNIONECTOMY Bilateral   . CARPAL TUNNEL RELEASE Right    x 2  . CARPAL TUNNEL RELEASE Left 07/06/2017   Procedure: CARPAL TUNNEL RELEASE AND LEFT THUMB [place] INJECTION;  Surgeon: Hessie Knows, MD;  Location: [facility];  Service: Orthopedics;  Laterality: Left;  . cataract surgery Bilateral   . CERVICAL FUSION      X 2  . COLONOSCOPY    . COLONOSCOPY WITH PROPOFOL N/A 06/21/2017   Procedure: COLONOSCOPY WITH PROPOFOL;  Surgeon: Manya Silvas, MD;  Location: [facility] ENDOSCOPY;  Service: Endoscopy;  Laterality: N/A;  . ESOPHAGOGASTRODUODENOSCOPY    . ESOPHAGOGASTRODUODENOSCOPY (EGD) WITH PROPOFOL N/A 11/21/2019   Procedure: ESOPHAGOGASTRODUODENOSCOPY (EGD) WITH PROPOFOL;  Surgeon: Lin Landsman, MD;  Location: [facility];  Service: Gastroenterology;  Laterality: N/A;  . EXPLORATORY LAPAROTOMY    . EYE SURGERY    . left middle finger surgery     pin- arthritic cyst  . LUMBAR LAMINECTOMY/DECOMPRESSION MICRODISCECTOMY Right 05/04/2015   Procedure: RIGHT LUMBAR TWO-THREE/THREE-FOUR LAMINECTOMY/DECOMPRESSION ;  Surgeon: Newman Pies, MD;  Location: [facility] NEURO ORS;  Service: Neurosurgery;  Laterality: Right;  Right L23 L34 laminectomy and foraminotomy  . Nissan fundoplication    . TONSILLECTOMY        Current Outpatient Medications:  .  acetaminophen (TYLENOL) 500 MG tablet, Take 1,000 mg by mouth every 6 (six) hours as needed for moderate pain or headache., Disp: , Rfl:  .  albuterol (PROAIR HFA) 108 (90 BASE) MCG/ACT inhaler, Inhale 2 puffs into the lungs every 4 (four) hours as needed for wheezing or shortness of breath. , Disp: , Rfl:  .  aspirin EC 81 MG tablet, Take 1 tablet (81 mg total) by mouth daily. (Patient taking differently: Take 81 mg by mouth every evening. ), Disp: , Rfl:  .  busPIRone (BUSPAR) 10 MG tablet, Take by mouth., Disp: , Rfl:  .  carvedilol (COREG) 12.5 MG tablet, Take by mouth., Disp: , Rfl:  .  cetirizine (ZYRTEC) 10 MG tablet, Take by mouth., Disp: , Rfl:  .  Cholecalciferol (VITAMIN  D) 2000 units CAPS, Take 2,000 Units by mouth daily. , Disp: , Rfl:  .  docusate sodium (COLACE) 100 MG capsule, Take 100 mg by mouth 2 (two) times daily. , Disp: , Rfl:  .  furosemide (LASIX) 20 MG tablet, Take by mouth., Disp: , Rfl:  .  gabapentin (NEURONTIN) 300 MG capsule, Take 300 mg by mouth at bedtime. , Disp: , Rfl:  .  irbesartan (AVAPRO) 300 MG tablet, TAKE 1 TABLET BY MOUTH DAILY, Disp: , Rfl:  .  losartan (COZAAR) 100 MG tablet, Take 100 mg by mouth at bedtime. , Disp: , Rfl:  .  magnesium oxide (MAG-OX) 400 MG tablet, Take by mouth., Disp: , Rfl:  .  metoprolol succinate (TOPROL-XL) 25 MG 24 hr tablet, Take 25 mg by mouth 2 (two) times daily. , Disp: , Rfl:  .  Multiple Vitamin (MULTIVITAMIN WITH MINERALS) TABS tablet, Take 1 tablet by mouth daily., Disp: , Rfl:  .  omeprazole (PRILOSEC) 40 MG capsule, Take 1 capsule (40 mg total) by mouth daily before breakfast., Disp: 30 capsule, Rfl: 1 .  vitamin B-12 (CYANOCOBALAMIN) 500 MCG tablet, Take 500 mcg by mouth daily. , Disp: , Rfl:  .  vitamin C (ASCORBIC ACID) 500 MG tablet, Take 500 mg by mouth daily. , Disp: , Rfl:  .  Vitamins/Minerals TABS, Take by mouth., Disp: , Rfl:    Family History  Problem Relation Age  of Onset  . Breast cancer Mother   . Breast cancer Maternal Aunt   . Diabetes Maternal Aunt   . Hypertension Father   . Diabetes Maternal Uncle   . Ovarian cancer Neg Hx   . Colon cancer Neg Hx      Social History   Tobacco Use  . Smoking status: Never Smoker  . Smokeless tobacco: Never Used  Substance Use Topics  . Alcohol use: Yes    Comment: wine rarely  . Drug use: No    Allergies as of 01/29/2020 - Review Complete 01/29/2020  Allergen Reaction Noted  . Erythromycin base Nausea Only 10/29/2014  . Hydrocodone Nausea Only 10/29/2014  . Oxycodone-acetaminophen Itching 04/03/2015  . Talwin [pentazocine] Nausea And Vomiting 10/29/2014  . Vicodin [hydrocodone-acetaminophen] Nausea And Vomiting 10/14/2014  . Vioxx [rofecoxib] Nausea And Vomiting 10/29/2014    Review of Systems:    All systems reviewed and negative except where noted in HPI.   Physical Exam:  BP 123/79 (BP Location: Left Arm, Patient Position: Sitting, Cuff Size: Normal)   Pulse 61   Temp 98.5 F (36.9 C) (Oral)   Wt 191 lb 2 oz (86.7 kg)   BMI 34.40 kg/m  No LMP recorded. Patient has had a hysterectomy.  General:   Alert,  Well-developed, well-nourished, pleasant and cooperative in NAD Head:  Normocephalic and atraumatic. Eyes:  Sclera clear, no icterus.   Conjunctiva pink. Ears:  Normal auditory acuity. Nose:  No deformity, discharge, or lesions. Mouth:  No deformity or lesions,oropharynx pink & moist. Neck:  Supple; no masses or thyromegaly. Lungs:  Respirations even and unlabored.  Clear throughout to auscultation.   No wheezes, crackles, or rhonchi. No acute distress. Heart:  Regular rate and rhythm; no murmurs, clicks, rubs, or gallops. Abdomen:  Normal bowel sounds. Soft, non-tender and non-distended without masses, hepatosplenomegaly or hernias noted.  No guarding or rebound tenderness.   Rectal: Not performed Msk:  Symmetrical without gross deformities. Good, equal movement & strength  bilaterally. Pulses:  Normal pulses noted. Extremities:  No clubbing or  edema.  No cyanosis. Neurologic:  Alert and oriented x3;  grossly normal neurologically. Skin:  Intact without significant lesions or rashes. No jaundice. Psych:  Alert and cooperative. Normal mood and affect.  Imaging Studies: Reviewed  Assessment and Plan:   GIDGET SCHOEFFLER is a 69 y.o. female with history of obesity, obstructive sleep apnea on CPAP, asthma, chronic GERD s/p Nissen fundoplication, hypertension is seen in for follow-up of chronic dysphagia and chronic constipation  Chronic GERD and dysphagia EGD was unremarkable, the wrap was intact Empiric dilation was not performed due to patient's respiratory distress during the procedure I offered her esophageal manometry, she deferred at this time unless her symptoms progress Continue omeprazole 40 mg, switch to before dinner to treat nocturnal heartburn   Follow up as needed   Cephas Darby, MD

## 2020-02-18 ENCOUNTER — Other Ambulatory Visit: Payer: Self-pay

## 2020-02-18 MED ORDER — OMEPRAZOLE 40 MG PO CPDR: 40.0000 mg | DELAYED_RELEASE_CAPSULE | Freq: Every day | ORAL | 1 refills | Status: DC

## 2020-02-18 NOTE — Telephone Encounter (Signed)
Last office visit 01/29/2020 chronic gerd  Last refill 12/16/2019 1 refills

## 2020-07-22 ENCOUNTER — Other Ambulatory Visit: Payer: Self-pay | Admitting: Internal Medicine

## 2020-07-22 DIAGNOSIS — Z1231 Encounter for screening mammogram for malignant neoplasm of breast: Secondary | ICD-10-CM

## 2020-08-03 ENCOUNTER — Other Ambulatory Visit: Payer: Self-pay | Admitting: Gastroenterology

## 2020-08-03 NOTE — Telephone Encounter (Signed)
Last office visit 01/29/2020 GERD  Last refill 02/18/2020 1 refills

## 2020-08-13 ENCOUNTER — Other Ambulatory Visit: Payer: Self-pay

## 2020-08-13 ENCOUNTER — Ambulatory Visit
Admission: RE | Admit: 2020-08-13 | Discharge: 2020-08-13 | Disposition: A | Discharge status: Home / Self Care | Payer: Medicare Other | Source: Ambulatory Visit | Attending: Internal Medicine | Admitting: Internal Medicine

## 2020-08-13 DIAGNOSIS — Z1231 Encounter for screening mammogram for malignant neoplasm of breast: Secondary | ICD-10-CM | POA: Insufficient documentation

## 2021-01-20 ENCOUNTER — Other Ambulatory Visit: Payer: Self-pay | Admitting: Gastroenterology

## 2021-01-29 ENCOUNTER — Telehealth: Payer: Self-pay | Admitting: Gastroenterology

## 2021-01-29 MED ORDER — OMEPRAZOLE 40 MG PO CPDR: DELAYED_RELEASE_CAPSULE | ORAL | 0 refills | Status: DC

## 2021-01-29 NOTE — Telephone Encounter (Signed)
omeprazole (PRILOSEC) 40 MG capsule  [facility]  90 day refill  Patient has scheduled a f/u appt.

## 2021-01-29 NOTE — Telephone Encounter (Signed)
Medications sent to pharmacy

## 2021-02-25 ENCOUNTER — Ambulatory Visit: Payer: Medicare Other | Admitting: Gastroenterology

## 2021-03-22 ENCOUNTER — Other Ambulatory Visit: Payer: Self-pay

## 2021-03-26 ENCOUNTER — Other Ambulatory Visit: Payer: Self-pay

## 2021-03-26 ENCOUNTER — Ambulatory Visit (INDEPENDENT_AMBULATORY_CARE_PROVIDER_SITE_OTHER): Payer: Medicare Other | Admitting: Gastroenterology

## 2021-03-26 ENCOUNTER — Encounter: Payer: Self-pay | Admitting: Gastroenterology

## 2021-03-26 VITALS — BP 110/72 | HR 67 | Temp 97.9°F | Ht 62.5 in | Wt 192.0 lb

## 2021-03-26 DIAGNOSIS — K219 Gastro-esophageal reflux disease without esophagitis: Secondary | ICD-10-CM

## 2021-03-26 DIAGNOSIS — R1319 Other dysphagia: Secondary | ICD-10-CM | POA: Diagnosis not present

## 2021-03-26 NOTE — Progress Notes (Signed)
Cephas Darby, MD [address]  [place]  [place], [place] [address]  Main: [phone]  Fax: [phone]    Gastroenterology Consultation  Referring Provider:     Idelle Crouch, MD Primary Care Physician:  Idelle Crouch, MD Primary Gastroenterologist:  Dr. Cephas Darby Reason for Consultation:   Chronic GERD, dysphagia, chronic constipation        HPI:   Megan Daniels is a 70 y.o. female referred by Dr. Doy Hutching, Leonie Douglas, MD  for consultation & management of chronic GERD, dysphagia and chronic constipation.  Chronic GERD and dysphagia Patient reports that she has been experiencing heart burn for several years and has undergone Nissen's  fundoplication. Over the past 2-3 years, she has been experiencing difficulty swallowing pills, GERD, solid foods.  She had episodes of aspiration resulting in episodes of pneumonia, treated with antibiotics and steroids.  She feels that the symptoms have been progressively getting worse.  Her weight has been stable.  She reports that she has to drink lots of water in order to get the pills and food down.  She denies difficulty swallowing.  She has been on Protonix 40 mg twice daily which controls her heartburn.  Her main concern today is difficulty swallowing and aspiration events that occurred in the last few years.  She also reports epigastric burning pain.  She has a history of asthma, also has sleep apnea, wears CPAP at night  Patient had a CT abdomen 07/2019 which revealed hiatal hernia with suggestion of partial slippage of Nissen's fundoplication  Chronic constipation Patient has been taking Colace 2 pills twice daily for a long time for irregular bowel habits.  She reports that if she cannot have alcohol she does not take stool softeners.  She denies lower abdominal discomfort.  Follow-up visit 01/29/2020 Patient underwent upper endoscopy which revealed intact Nissen's fundoplication, empiric dilation was not due to  patient's respiratory distress during the procedure.  She is currently on omeprazole 40 mg daily before breakfast.  She does report nocturnal heartburn and continues to have sensation of food stuck in her upper throat.  She also reported cervical spine surgeries which she thinks is probably contributing to her upper GI symptoms.   Follow-up visit 03/26/2021 Patient is here for routine follow-up of chronic GERD.  She reports that her symptoms are fairly under control on omeprazole 40 mg daily in the morning.  She takes Pepcid as needed only but occasional.  She reports that she has to eat carefully otherwise she has coughing spells whenever food touches back of her throat.  She does have sensation of something getting stuck especially pills.  I attempted empiric dilation of the GE junction during previous EGD, however due to desaturation during the procedure, I did not perform dilation.  Patient deferred esophageal manometry as well.  She has not been physically active.  She takes care of her husband who has mild dementia.  NSAIDs: None  Antiplts/Anticoagulants/Anti thrombotics: None  GI Procedures:  EGD 11/21/2019 - Normal duodenal bulb and second portion of the duodenum. - Multiple gastric polyps. - A Nissen fundoplication was found. The wrap appears intact. - No specimens collected.  Colonoscopy by Dr. Vira Agar in 06/21/2017  - Diverticulosis in the sigmoid colon. - Internal hemorrhoids. - The examination was otherwise normal. - No specimens collected.  Past Medical History:  Diagnosis Date   Abnormal Pap smear of cervix    pos hpv-    Acid reflux  Anxiety    Arthritis    Asthma    Symbicort as needed   Cancer ([place])    Basal Cell Skin Cancer   Chalazion of left eye    Chronic back pain    spondylolisthesis/stenosis   Chronic UTI    was treated for 3 months with Macrobid and in Dec everything was fine   Constipation    COPD (chronic obstructive pulmonary disease) (HCC)     Diverticulosis    Elevated serum creatinine    with nsaids   Foot drop    right   Genital warts    h/o   GERD (gastroesophageal reflux disease)    takes Prevacid daily   Headache    History of bronchitis    History of colon polyps    History of hiatal hernia    Hx of vertigo    Hypertension    takes Losartan daily   IBS (irritable bowel syndrome)    Joint pain    Palpitations    takes Metoprolol daily   Pneumonia    hx of-early 2000's   PONV (postoperative nausea and vomiting)    extreme nausea and vomiting -none last surgery 10/29/14   Sarcoidosis    Seasonal allergies    takes Claritin daily   Shortness of breath dyspnea    Sleep apnea    cpap 10 yrs   Stress incontinence    Thyroid nodule    URI (upper respiratory infection)    treated self with Mucinex and no fever   Urinary urgency    Vitamin D deficiency    Weakness    numbness and tingling in both legs    Past Surgical History:  Procedure Laterality Date   ABDOMINAL HYSTERECTOMY     ANTERIOR CERVICAL DECOMP/DISCECTOMY FUSION N/A 10/06/2016   Procedure: ANTERIOR CERVICAL DECOMPRESSION/DISCECTOMY FUSION , INTERBODY PROSTHESIS, PLATE CERVICAL FOUR CERVICAL FIVE, CERVICAL SIX- CERVICAL SEVEN;  Surgeon: Newman Pies, MD;  Location: [facility];  Service: Neurosurgery;  Laterality: N/A;  anterior   BACK SURGERY      X 3   BREAST BIOPSY Right 1996   EXCISIONAL - NEG   BUNIONECTOMY Bilateral    CARPAL TUNNEL RELEASE Right    x 2   CARPAL TUNNEL RELEASE Left 07/06/2017   Procedure: CARPAL TUNNEL RELEASE AND LEFT THUMB [place] INJECTION;  Surgeon: Hessie Knows, MD;  Location: [facility] ORS;  Service: Orthopedics;  Laterality: Left;   cataract surgery Bilateral    CERVICAL FUSION      X 2   COLONOSCOPY     COLONOSCOPY WITH PROPOFOL N/A 06/21/2017   Procedure: COLONOSCOPY WITH PROPOFOL;  Surgeon: Manya Silvas, MD;  Location: [facility] ENDOSCOPY;  Service: Endoscopy;  Laterality: N/A;   ESOPHAGOGASTRODUODENOSCOPY      ESOPHAGOGASTRODUODENOSCOPY (EGD) WITH PROPOFOL N/A 11/21/2019   Procedure: ESOPHAGOGASTRODUODENOSCOPY (EGD) WITH PROPOFOL;  Surgeon: Lin Landsman, MD;  Location: [facility];  Service: Gastroenterology;  Laterality: N/A;   EXPLORATORY LAPAROTOMY     EYE SURGERY     left middle finger surgery     pin- arthritic cyst   LUMBAR LAMINECTOMY/DECOMPRESSION MICRODISCECTOMY Right 05/04/2015   Procedure: RIGHT LUMBAR TWO-THREE/THREE-FOUR LAMINECTOMY/DECOMPRESSION ;  Surgeon: Newman Pies, MD;  Location: [facility]UR[facility]S;  Service: Neurosurgery;  Laterality: Right;  Right L23 L34 laminectomy and foraminotomy   Nissan fundoplication     TONSILLECTOMY      Current Outpatient Medications:    acetaminophen (TYLENOL) 500 MG tablet, Take 1,000 mg by mouth every 6 (six)  hours as needed for moderate pain or headache., Disp: , Rfl:    albuterol (VENTOLIN HFA) 108 (90 Base) MCG/ACT inhaler, Inhale 2 puffs into the lungs every 4 (four) hours as needed for wheezing or shortness of breath. , Disp: , Rfl:    amLODipine (NORVASC) 5 MG tablet, Take 5 mg by mouth daily., Disp: , Rfl:    aspirin EC 81 MG tablet, Take 1 tablet (81 mg total) by mouth daily. (Patient taking differently: Take 81 mg by mouth every evening.), Disp: , Rfl:    busPIRone (BUSPAR) 10 MG tablet, Take 10 mg by mouth 2 (two) times daily., Disp: , Rfl:    cetirizine (ZYRTEC) 10 MG tablet, Take by mouth., Disp: , Rfl:    Cholecalciferol (VITAMIN D) 2000 units CAPS, Take 2,000 Units by mouth daily. , Disp: , Rfl:    docusate sodium (COLACE) 100 MG capsule, Take 100 mg by mouth 2 (two) times daily. , Disp: , Rfl:    gabapentin (NEURONTIN) 300 MG capsule, Take by mouth., Disp: , Rfl:    irbesartan (AVAPRO) 300 MG tablet, TAKE 1 TABLET BY MOUTH DAILY, Disp: , Rfl:    losartan (COZAAR) 100 MG tablet, Take 100 mg by mouth at bedtime. , Disp: , Rfl:    magnesium oxide (MAG-OX) 400 MG tablet, Take by mouth., Disp: , Rfl:    metoprolol succinate  (TOPROL-XL) 25 MG 24 hr tablet, Take 25 mg by mouth 2 (two) times daily. , Disp: , Rfl:    Multiple Vitamin (MULTIVITAMIN WITH MINERALS) TABS tablet, Take 1 tablet by mouth daily., Disp: , Rfl:    omeprazole (PRILOSEC) 40 MG capsule, Take by mouth., Disp: , Rfl:    vitamin B-12 (CYANOCOBALAMIN) 500 MCG tablet, Take 500 mcg by mouth daily. , Disp: , Rfl:    vitamin C (ASCORBIC ACID) 500 MG tablet, Take 500 mg by mouth daily., Disp: , Rfl:    Vitamins/Minerals TABS, Take by mouth., Disp: , Rfl:    carvedilol (COREG) 12.5 MG tablet, Take by mouth., Disp: , Rfl:    Family History  Problem Relation Age of Onset   Breast cancer Mother    Breast cancer Maternal Aunt    Diabetes Maternal Aunt    Hypertension Father    Diabetes Maternal Uncle    Breast cancer Cousin        mat cousin   Ovarian cancer Neg Hx    Colon cancer Neg Hx      Social History   Tobacco Use   Smoking status: Never   Smokeless tobacco: Never  Vaping Use   Vaping Use: Never used  Substance Use Topics   Alcohol use: Yes    Comment: wine rarely   Drug use: No    Allergies as of 03/26/2021 - Review Complete 03/26/2021  Allergen Reaction Noted   Erythromycin base Nausea Only 10/29/2014   Hydrocodone Nausea Only 10/29/2014   Oxycodone-acetaminophen Itching 04/03/2015   Talwin [pentazocine] Nausea And Vomiting 10/29/2014   Vicodin [hydrocodone-acetaminophen] Nausea And Vomiting 10/14/2014   Vioxx [rofecoxib] Nausea And Vomiting 10/29/2014    Review of Systems:    All systems reviewed and negative except where noted in HPI.   Physical Exam:  BP 110/72 (BP Location: Left Arm, Patient Position: Sitting, Cuff Size: Normal)   Pulse 67   Temp 97.9 F (36.6 C) (Oral)   Ht 5' 2.5" (1.588 m)   Wt 192 lb (87.1 kg)   BMI 34.56 kg/m  No LMP recorded. Patient has  had a hysterectomy.  General:   Alert,  Well-developed, well-nourished, pleasant and cooperative in NAD Head:  Normocephalic and atraumatic. Eyes:   Sclera clear, no icterus.   Conjunctiva pink. Ears:  Normal auditory acuity. Nose:  No deformity, discharge, or lesions. Mouth:  No deformity or lesions,oropharynx pink & moist. Neck:  Supple; no masses or thyromegaly. Lungs:  Respirations even and unlabored.  Clear throughout to auscultation.   No wheezes, crackles, or rhonchi. No acute distress. Heart:  Regular rate and rhythm; no murmurs, clicks, rubs, or gallops. Abdomen:  Normal bowel sounds. Soft, non-tender and non-distended without masses, hepatosplenomegaly or hernias noted.  No guarding or rebound tenderness.   Rectal: Not performed Msk:  Symmetrical without gross deformities. Good, equal movement & strength bilaterally. Pulses:  Normal pulses noted. Extremities:  No clubbing or edema.  No cyanosis. Neurologic:  Alert and oriented x3;  grossly normal neurologically. Skin:  Intact without significant lesions or rashes. No jaundice. Psych:  Alert and cooperative. Normal mood and affect.  Imaging Studies: Reviewed  Assessment and Plan:   Megan Daniels is a 70 y.o. female with history of obesity, obstructive sleep apnea on CPAP, asthma, chronic GERD s/p Nissen fundoplication, hypertension is seen in for follow-up of chronic dysphagia and chronic constipation  Chronic GERD and dysphagia EGD was unremarkable, the wrap was intact Empiric dilation was not performed due to patient's respiratory distress during the procedure I offered her esophageal manometry, she deferred at this time unless her symptoms progress Continue omeprazole 40 mg before meals  Coughing spells with food I offered modified barium swallow today, patient deferred.  She said she will contact my office if the spells become more frequent  Follow up as needed   Cephas Darby, MD

## 2021-04-20 ENCOUNTER — Other Ambulatory Visit: Payer: Self-pay | Admitting: Gastroenterology

## 2021-07-06 ENCOUNTER — Other Ambulatory Visit: Payer: Self-pay | Admitting: Internal Medicine

## 2021-07-06 DIAGNOSIS — J849 Interstitial pulmonary disease, unspecified: Secondary | ICD-10-CM

## 2021-07-07 ENCOUNTER — Other Ambulatory Visit: Payer: Self-pay | Admitting: Internal Medicine

## 2021-07-07 DIAGNOSIS — Z1231 Encounter for screening mammogram for malignant neoplasm of breast: Secondary | ICD-10-CM

## 2021-07-13 ENCOUNTER — Other Ambulatory Visit: Payer: Self-pay | Admitting: Internal Medicine

## 2021-07-13 DIAGNOSIS — N6320 Unspecified lump in the left breast, unspecified quadrant: Secondary | ICD-10-CM

## 2021-07-13 DIAGNOSIS — N6459 Other signs and symptoms in breast: Secondary | ICD-10-CM

## 2021-07-15 ENCOUNTER — Other Ambulatory Visit: Payer: Self-pay | Admitting: Internal Medicine

## 2021-07-15 DIAGNOSIS — N6320 Unspecified lump in the left breast, unspecified quadrant: Secondary | ICD-10-CM

## 2021-07-15 DIAGNOSIS — N6459 Other signs and symptoms in breast: Secondary | ICD-10-CM

## 2021-07-15 DIAGNOSIS — R928 Other abnormal and inconclusive findings on diagnostic imaging of breast: Secondary | ICD-10-CM

## 2021-07-20 ENCOUNTER — Ambulatory Visit
Admission: RE | Admit: 2021-07-20 | Discharge: 2021-07-20 | Disposition: A | Discharge status: Home / Self Care | Payer: Medicare Other | Source: Ambulatory Visit | Attending: Internal Medicine | Admitting: Internal Medicine

## 2021-07-20 ENCOUNTER — Other Ambulatory Visit: Payer: Self-pay

## 2021-07-20 DIAGNOSIS — J849 Interstitial pulmonary disease, unspecified: Secondary | ICD-10-CM | POA: Diagnosis not present

## 2021-07-22 ENCOUNTER — Ambulatory Visit
Admission: RE | Admit: 2021-07-22 | Discharge: 2021-07-22 | Disposition: A | Discharge status: Home / Self Care | Payer: Medicare Other | Source: Ambulatory Visit | Attending: Internal Medicine | Admitting: Internal Medicine

## 2021-07-22 ENCOUNTER — Other Ambulatory Visit: Payer: Self-pay

## 2021-07-22 DIAGNOSIS — N6459 Other signs and symptoms in breast: Secondary | ICD-10-CM

## 2021-07-22 DIAGNOSIS — N6320 Unspecified lump in the left breast, unspecified quadrant: Secondary | ICD-10-CM | POA: Diagnosis present

## 2021-07-22 DIAGNOSIS — R928 Other abnormal and inconclusive findings on diagnostic imaging of breast: Secondary | ICD-10-CM

## 2022-02-07 DIAGNOSIS — R7303 Prediabetes: Secondary | ICD-10-CM | POA: Insufficient documentation

## 2022-05-12 ENCOUNTER — Other Ambulatory Visit: Payer: Self-pay | Admitting: Internal Medicine

## 2022-05-12 DIAGNOSIS — Z1231 Encounter for screening mammogram for malignant neoplasm of breast: Secondary | ICD-10-CM

## 2022-07-25 ENCOUNTER — Ambulatory Visit
Admission: RE | Admit: 2022-07-25 | Discharge: 2022-07-25 | Disposition: A | Discharge status: Home / Self Care | Payer: Medicare Other | Source: Ambulatory Visit | Attending: Internal Medicine | Admitting: Internal Medicine

## 2022-07-25 DIAGNOSIS — Z1231 Encounter for screening mammogram for malignant neoplasm of breast: Secondary | ICD-10-CM | POA: Diagnosis present

## 2022-08-10 ENCOUNTER — Other Ambulatory Visit: Payer: Self-pay | Admitting: Internal Medicine

## 2022-08-10 DIAGNOSIS — I10 Essential (primary) hypertension: Secondary | ICD-10-CM

## 2022-08-10 DIAGNOSIS — J849 Interstitial pulmonary disease, unspecified: Secondary | ICD-10-CM

## 2022-08-18 ENCOUNTER — Ambulatory Visit
Admission: RE | Admit: 2022-08-18 | Discharge: 2022-08-18 | Disposition: A | Discharge status: Home / Self Care | Payer: Medicare Other | Source: Ambulatory Visit | Attending: Internal Medicine | Admitting: Internal Medicine

## 2022-08-18 DIAGNOSIS — J849 Interstitial pulmonary disease, unspecified: Secondary | ICD-10-CM | POA: Diagnosis present

## 2022-08-18 DIAGNOSIS — I10 Essential (primary) hypertension: Secondary | ICD-10-CM | POA: Diagnosis present

## 2022-11-24 NOTE — H&P (Signed)
Pre-Procedure H&P   Patient ID: Megan Daniels is a 72 y.o. female.  Gastroenterology Provider: Annamaria Helling, DO  PCP: Idelle Crouch, MD  Date: 11/25/2022  HPI [name] is a 72 y.o. female who presents today for Colonoscopy for Surveillance-personal history of colon polyps .  Patient with a personal history of constipation treating with Colace and probiotics.  She has previously been on Linzess in the past- not currently taking.  She denies any melena hematochezia abdominal pain or changes in weight and appetite.    Her last colonoscopy was October 2018 with sigmoid diverticulosis and internal hemorrhoids noted.  Father with history of colon polyps but no history of colon cancer in the family.  Had 1 adenomatous polyp in December 2012 colonoscopy.  Colonoscopy was in 2007 2001 were negative for polyps  Most recent lab work hemoglobin 13.2 MCV 91.5 platelets 201,000 creatinine 1.4  Status post hysterectomy pyloroplasty and nissen fundoplication; C spine surgery   Past Medical History:  Diagnosis Date   Abnormal Pap smear of cervix    pos hpv-    Acid reflux    Anxiety    Arthritis    Asthma    Symbicort as needed   Cancer ([place])    Basal Cell Skin Cancer   Chalazion of left eye    Chronic back pain    spondylolisthesis/stenosis   Chronic UTI    was treated for 3 months with Macrobid and in Dec everything was fine   Constipation    COPD (chronic obstructive pulmonary disease) (HCC)    Diverticulosis    Elevated serum creatinine    with nsaids   Foot drop    right   Genital warts    h/o   GERD (gastroesophageal reflux disease)    takes Prevacid daily   Headache    History of bronchitis    History of colon polyps    History of hiatal hernia    Hx of vertigo    Hypertension    takes Losartan daily   IBS (irritable bowel syndrome)    Joint pain    Palpitations    takes Metoprolol daily   Pneumonia    hx of-early 2000's   PONV  (postoperative nausea and vomiting)    extreme nausea and vomiting -none last surgery 10/29/14   Sarcoidosis    Seasonal allergies    takes Claritin daily   Shortness of breath dyspnea    Sleep apnea    cpap 10 yrs   Stress incontinence    Thyroid nodule    URI (upper respiratory infection)    treated self with Mucinex and no fever   Urinary urgency    Vitamin D deficiency    Weakness    numbness and tingling in both legs    Past Surgical History:  Procedure Laterality Date   ABDOMINAL HYSTERECTOMY     ANTERIOR CERVICAL DECOMP/DISCECTOMY FUSION N/A 10/06/2016   Procedure: ANTERIOR CERVICAL DECOMPRESSION/DISCECTOMY FUSION , INTERBODY PROSTHESIS, PLATE CERVICAL FOUR CERVICAL FIVE, CERVICAL SIX- CERVICAL SEVEN;  Surgeon: Newman Pies, MD;  Location: [facility];  Service: Neurosurgery;  Laterality: N/A;  anterior   BACK SURGERY      X 3   BREAST BIOPSY Right 1996   EXCISIONAL - NEG   BUNIONECTOMY Bilateral    CARPAL TUNNEL RELEASE Right    x 2   CARPAL TUNNEL RELEASE Left 07/06/2017   Procedure: CARPAL TUNNEL RELEASE AND LEFT THUMB [place] INJECTION;  Surgeon:  Hessie Knows, MD;  Location: [facility] ORS;  Service: Orthopedics;  Laterality: Left;   cataract surgery Bilateral    CERVICAL FUSION      X 2   COLONOSCOPY     COLONOSCOPY WITH PROPOFOL N/A 06/21/2017   Procedure: COLONOSCOPY WITH PROPOFOL;  Surgeon: Manya Silvas, MD;  Location: [facility] END[facility]COPY;  Service: Endoscopy;  Laterality: N/A;   ESOPHAGOGASTRODUODENOSCOPY     ESOPHAGOGASTRODUODENOSCOPY (EGD) WITH PROPOFOL N/A 11/21/2019   Procedure: ESOPHAGOGASTRODUODENOSCOPY (EGD) WITH PROPOFOL;  Surgeon: Lin Landsman, MD;  Location: [facility];  Service: Gastroenterology;  Laterality: N/A;   EXPLORATORY LAPAROTOMY     EYE SURGERY     left middle finger surgery     pin- arthritic cyst   LUMBAR LAMINECTOMY/DECOMPRESSION MICRODISCECTOMY Right 05/04/2015   Procedure: RIGHT LUMBAR TWO-THREE/THREE-FOUR LAMINECTOMY/DECOMPRESSION ;   Surgeon: Newman Pies, MD;  Location: [place] [facility];  Service: Neurosurgery;  Laterality: Right;  Right L23 L34 laminectomy and foraminotomy   Nissan fundoplication     TONSILLECTOMY      Family History Father-history of colon polyps No h/o GI disease or malignancy  Review of Systems  Constitutional:  Negative for activity change, appetite change, chills, diaphoresis, fatigue, fever and unexpected weight change.  HENT:  Positive for trouble swallowing. Negative for voice change.   Respiratory:  Negative for shortness of breath and wheezing.   Cardiovascular:  Negative for chest pain, palpitations and leg swelling.  Gastrointestinal:  Positive for constipation. Negative for abdominal distention, abdominal pain, anal bleeding, blood in stool, diarrhea, nausea, rectal pain and vomiting.  Musculoskeletal:  Negative for arthralgias and myalgias.  Skin:  Negative for color change and pallor.  Neurological:  Negative for dizziness, syncope and weakness.  Psychiatric/Behavioral:  Negative for confusion.   All other systems reviewed and are negative.    Medications No current facility-administered medications on file prior to encounter.   Current Outpatient Medications on File Prior to Encounter  Medication Sig Dispense Refill   amLODipine (NORVASC) 5 MG tablet Take 5 mg by mouth daily.     busPIRone (BUSPAR) 10 MG tablet Take 10 mg by mouth 2 (two) times daily.     carvedilol (COREG) 12.5 MG tablet Take 12.5 mg by mouth.     Cholecalciferol (VITAMIN D) 2000 units CAPS Take 2,000 Units by mouth daily.      gabapentin (NEURONTIN) 300 MG capsule Take by mouth.     irbesartan (AVAPRO) 300 MG tablet TAKE 1 TABLET BY MOUTH DAILY     magnesium oxide (MAG-OX) 400 MG tablet Take by mouth.     omeprazole (PRILOSEC) 40 MG capsule TAKE 1 CAPSULE DAILY BEFORE BREAKFAST 90 capsule 3   vitamin B-12 (CYANOCOBALAMIN) 500 MCG tablet Take 500 mcg by mouth daily.      vitamin C (ASCORBIC ACID) 500 MG  tablet Take 500 mg by mouth daily.     Vitamins/Minerals TABS Take by mouth.     acetaminophen (TYLENOL) 500 MG tablet Take 1,000 mg by mouth every 6 (six) hours as needed for moderate pain or headache.     albuterol (VENTOLIN HFA) 108 (90 Base) MCG/ACT inhaler Inhale 2 puffs into the lungs every 4 (four) hours as needed for wheezing or shortness of breath.  (Patient not taking: Reported on 11/25/2022)     aspirin EC 81 MG tablet Take 1 tablet (81 mg total) by mouth daily. (Patient taking differently: Take 81 mg by mouth every evening.)     cetirizine (ZYRTEC) 10 MG tablet Take by  mouth.     docusate sodium (COLACE) 100 MG capsule Take 100 mg by mouth 2 (two) times daily.      losartan (COZAAR) 100 MG tablet Take 100 mg by mouth at bedtime.  (Patient not taking: Reported on 11/25/2022)     metoprolol succinate (TOPROL-XL) 25 MG 24 hr tablet Take 25 mg by mouth 2 (two) times daily.  (Patient not taking: Reported on 11/25/2022)     Multiple Vitamin (MULTIVITAMIN WITH MINERALS) TABS tablet Take 1 tablet by mouth daily.      Pertinent medications related to GI and procedure were reviewed by me with the patient prior to the procedure   Current Facility-Administered Medications:    0.9 %  sodium chloride infusion, , Intravenous, Continuous, Annamaria Helling, DO      Allergies  Allergen Reactions   Erythromycin Base Nausea Only   Hydrocodone Nausea Only   Oxycodone-Acetaminophen Itching   Talwin [Pentazocine] Nausea And Vomiting   Vicodin [Hydrocodone-Acetaminophen] Nausea And Vomiting   Vioxx [Rofecoxib] Nausea And Vomiting   Allergies were reviewed by me prior to the procedure  Objective   Body mass index is 33.46 kg/m. Vitals:   11/25/22 0918  BP: (!) 144/77  Pulse: 65  Resp: 18  Temp: (!) 97.4 F (36.3 C)  TempSrc: Temporal  SpO2: 100%  Weight: 81.6 kg  Height: 5' 1.5" (1.562 m)     Physical Exam Vitals and nursing note reviewed.  Constitutional:      General: She  is not in acute distress.    Appearance: Normal appearance. She is obese. She is not ill-appearing, toxic-appearing or diaphoretic.  HENT:     Head: Normocephalic and atraumatic.     Nose: Nose normal.     Mouth/Throat:     Mouth: Mucous membranes are moist.     Pharynx: Oropharynx is clear.  Eyes:     General: No scleral icterus.    Extraocular Movements: Extraocular movements intact.  Cardiovascular:     Rate and Rhythm: Normal rate and regular rhythm.     Heart sounds: Normal heart sounds. No murmur heard.    No friction rub. No gallop.  Pulmonary:     Effort: Pulmonary effort is normal. No respiratory distress.     Breath sounds: Normal breath sounds. No wheezing, rhonchi or rales.  Abdominal:     General: Bowel sounds are normal. There is no distension.     Palpations: Abdomen is soft.     Tenderness: There is no abdominal tenderness. There is no guarding or rebound.  Musculoskeletal:     Cervical back: Neck supple.     Right lower leg: No edema.     Left lower leg: No edema.  Skin:    General: Skin is warm and dry.     Coloration: Skin is not jaundiced or pale.  Neurological:     General: No focal deficit present.     Mental Status: She is alert and oriented to person, place, and time. Mental status is at baseline.  Psychiatric:        Mood and Affect: Mood normal.        Behavior: Behavior normal.        Thought Content: Thought content normal.        Judgment: Judgment normal.      Assessment:  [name] is a 72 y.o. female  who presents today for Colonoscopy for Surveillance-personal history of colon polyps .  Plan:  Colonoscopy with possible  intervention today  Colonoscopy with possible biopsy, control of bleeding, polypectomy, and interventions as necessary has been discussed with the patient/patient representative. Informed consent was obtained from the patient/patient representative after explaining the indication, nature, and risks of the  procedure including but not limited to death, bleeding, perforation, missed neoplasm/lesions, cardiorespiratory compromise, and reaction to medications. Opportunity for questions was given and appropriate answers were provided. Patient/patient representative has verbalized understanding is amenable to undergoing the procedure.   Annamaria Helling, DO  [facility]astroenter[facility]  Portions of the record may have been created with voice recognition software. Occasional wrong-word or 'sound-a-like' substitutions may have occurred due to the inherent limitations of voice recognition software.  Read the chart carefully and recognize, using context, where substitutions may have occurred.

## 2022-11-25 ENCOUNTER — Ambulatory Visit: Payer: Medicare Other | Admitting: Anesthesiology

## 2022-11-25 ENCOUNTER — Encounter: Payer: Self-pay | Admitting: Gastroenterology

## 2022-11-25 ENCOUNTER — Ambulatory Visit
Admission: RE | Admit: 2022-11-25 | Discharge: 2022-11-25 | Disposition: A | Discharge status: Home / Self Care | Payer: Medicare Other | Source: Ambulatory Visit | Attending: Gastroenterology | Admitting: Gastroenterology

## 2022-11-25 ENCOUNTER — Encounter
Admission: RE | Disposition: A | Discharge status: Home / Self Care | Payer: Self-pay | Source: Ambulatory Visit | Attending: Gastroenterology

## 2022-11-25 ENCOUNTER — Other Ambulatory Visit: Payer: Self-pay

## 2022-11-25 DIAGNOSIS — J449 Chronic obstructive pulmonary disease, unspecified: Secondary | ICD-10-CM | POA: Diagnosis not present

## 2022-11-25 DIAGNOSIS — K635 Polyp of colon: Secondary | ICD-10-CM | POA: Diagnosis not present

## 2022-11-25 DIAGNOSIS — K219 Gastro-esophageal reflux disease without esophagitis: Secondary | ICD-10-CM | POA: Insufficient documentation

## 2022-11-25 DIAGNOSIS — D123 Benign neoplasm of transverse colon: Secondary | ICD-10-CM | POA: Insufficient documentation

## 2022-11-25 DIAGNOSIS — Z79899 Other long term (current) drug therapy: Secondary | ICD-10-CM | POA: Diagnosis not present

## 2022-11-25 DIAGNOSIS — Z1211 Encounter for screening for malignant neoplasm of colon: Secondary | ICD-10-CM | POA: Diagnosis present

## 2022-11-25 DIAGNOSIS — K6389 Other specified diseases of intestine: Secondary | ICD-10-CM | POA: Diagnosis not present

## 2022-11-25 DIAGNOSIS — K621 Rectal polyp: Secondary | ICD-10-CM | POA: Insufficient documentation

## 2022-11-25 DIAGNOSIS — Z86010 Personal history of colonic polyps: Secondary | ICD-10-CM | POA: Insufficient documentation

## 2022-11-25 DIAGNOSIS — G4730 Sleep apnea, unspecified: Secondary | ICD-10-CM | POA: Diagnosis not present

## 2022-11-25 DIAGNOSIS — D869 Sarcoidosis, unspecified: Secondary | ICD-10-CM | POA: Insufficient documentation

## 2022-11-25 DIAGNOSIS — Z85828 Personal history of other malignant neoplasm of skin: Secondary | ICD-10-CM | POA: Insufficient documentation

## 2022-11-25 DIAGNOSIS — K5730 Diverticulosis of large intestine without perforation or abscess without bleeding: Secondary | ICD-10-CM | POA: Insufficient documentation

## 2022-11-25 DIAGNOSIS — K589 Irritable bowel syndrome without diarrhea: Secondary | ICD-10-CM | POA: Insufficient documentation

## 2022-11-25 DIAGNOSIS — F419 Anxiety disorder, unspecified: Secondary | ICD-10-CM | POA: Diagnosis not present

## 2022-11-25 DIAGNOSIS — K640 First degree hemorrhoids: Secondary | ICD-10-CM | POA: Diagnosis not present

## 2022-11-25 DIAGNOSIS — I10 Essential (primary) hypertension: Secondary | ICD-10-CM | POA: Diagnosis not present

## 2022-11-25 HISTORY — PX: COLONOSCOPY: SHX5424

## 2022-11-25 SURGERY — COLONOSCOPY
Anesthesia: General

## 2022-11-25 MED ORDER — PROPOFOL 500 MG/50ML IV EMUL
INTRAVENOUS | Status: DC | PRN
  Administered 2022-11-25: 125 ug/kg/min via INTRAVENOUS

## 2022-11-25 MED ORDER — SODIUM CHLORIDE 0.9 % IV SOLN: INTRAVENOUS | Status: DC

## 2022-11-25 MED ORDER — PROPOFOL 10 MG/ML IV BOLUS
INTRAVENOUS | Status: DC | PRN
  Administered 2022-11-25: 50 mg via INTRAVENOUS

## 2022-11-25 MED ORDER — ONDANSETRON HCL 4 MG/2ML IJ SOLN
INTRAMUSCULAR | Status: DC | PRN
  Administered 2022-11-25: 4 mg via INTRAVENOUS

## 2022-11-25 NOTE — Anesthesia Preprocedure Evaluation (Signed)
Anesthesia Evaluation  Patient identified by MRN, date of birth, ID band Patient awake    Reviewed: Allergy & Precautions, NPO status , Patient's Chart, lab work & pertinent test results  Airway Mallampati: III  TM Distance: >3 FB Neck ROM: full    Dental  (+) Teeth Intact   Pulmonary neg pulmonary ROS, sleep apnea and Continuous Positive Airway Pressure Ventilation , COPD   Pulmonary exam normal  + decreased breath sounds      Cardiovascular hypertension, Pt. on medications + DOE  negative cardio ROS Normal cardiovascular exam Rhythm:Regular Rate:Normal     Neuro/Psych  Headaches  Anxiety     negative neurological ROS  negative psych ROS   GI/Hepatic negative GI ROS, Neg liver ROS, hiatal hernia,GERD  ,,  Endo/Other  negative endocrine ROS    Renal/GU negative Renal ROS  negative genitourinary   Musculoskeletal  (+) Arthritis ,    Abdominal  (+) + obese  Peds negative pediatric ROS (+)  Hematology negative hematology ROS (+)   Anesthesia Other Findings Past Medical History: No date: Abnormal Pap smear of cervix     Comment:  pos hpv-  No date: Acid reflux No date: Anxiety No date: Arthritis No date: Asthma     Comment:  Symbicort as needed No date: Cancer ([place])     Comment:  Basal Cell Skin Cancer No date: Chalazion of left eye No date: Chronic back pain     Comment:  spondylolisthesis/stenosis No date: Chronic UTI     Comment:  was treated for 3 months with Macrobid and in Dec               everything was fine No date: Constipation No date: COPD (chronic obstructive pulmonary disease) (HCC) No date: Diverticulosis No date: Elevated serum creatinine     Comment:  with nsaids No date: Foot drop     Comment:  right No date: Genital warts     Comment:  h/o No date: GERD (gastroesophageal reflux disease)     Comment:  takes Prevacid daily No date: Headache No date: History of bronchitis No date:  History of colon polyps No date: History of hiatal hernia No date: Hx of vertigo No date: Hypertension     Comment:  takes Losartan daily No date: IBS (irritable bowel syndrome) No date: Joint pain No date: Palpitations     Comment:  takes Metoprolol daily No date: Pneumonia     Comment:  hx of-early 2000's No date: PONV (postoperative nausea and vomiting)     Comment:  extreme nausea and vomiting -none last surgery 10/29/14 No date: Sarcoidosis No date: Seasonal allergies     Comment:  takes Claritin daily No date: Shortness of breath dyspnea No date: Sleep apnea     Comment:  cpap 10 yrs No date: Stress incontinence No date: Thyroid nodule No date: URI (upper respiratory infection)     Comment:  treated self with Mucinex and no fever No date: Urinary urgency No date: Vitamin D deficiency No date: Weakness     Comment:  numbness and tingling in both legs  Past Surgical History: No date: ABDOMINAL HYSTERECTOMY 10/06/2016: ANTERIOR CERVICAL DECOMP/DISCECTOMY FUSION; N/A     Comment:  Procedure: ANTERIOR CERVICAL DECOMPRESSION/DISCECTOMY               FUSION , INTERBODY PROSTHESIS, PLATE CERVICAL FOUR               CERVICAL FIVE, CERVICAL SIX- CERVICAL SEVEN;  Surgeon:  Newman Pies, MD;  Location: [facility];  Service:               Neurosurgery;  Laterality: N/A;  anterior No date: BACK SURGERY     Comment:   X 3 1996: BREAST BIOPSY; Right     Comment:  EXCISIONAL - NEG No date: BUNIONECTOMY; Bilateral No date: CARPAL TUNNEL RELEASE; Right     Comment:  x 2 07/06/2017: CARPAL TUNNEL RELEASE; Left     Comment:  Procedure: CARPAL TUNNEL RELEASE AND LEFT THUMB [place]               INJECTION;  Surgeon: Hessie Knows, MD;  Location: [facility]               ORS;  Service: Orthopedics;  Laterality: Left; No date: cataract surgery; Bilateral No date: CERVICAL FUSION     Comment:   X 2 No date: COLONOSCOPY 06/21/2017: COLONOSCOPY WITH PROPOFOL; N/A     Comment:  Procedure:  COLONOSCOPY WITH PROPOFOL;  Surgeon: Manya Silvas, MD;  Location: [facility] ENDOSCOPY;  Service:               Endoscopy;  Laterality: N/A; No date: ESOPHAGOGASTRODUODENOSCOPY 11/21/2019: ESOPHAGOGASTRODUODENOSCOPY (EGD) WITH PROPOFOL; N/A     Comment:  Procedure: ESOPHAGOGASTRODUODENOSCOPY (EGD) WITH               PROPOFOL;  Surgeon: Lin Landsman, MD;  Location:               [facility] ENDOSCOPY;  Service: Gastroenterology;  Laterality:               N/A; No date: EXPLORATORY LAPAROTOMY No date: EYE SURGERY No date: left middle finger surgery     Comment:  pin- arthritic cyst 05/04/2015: LUMBAR LAMINECTOMY/DECOMPRESSION MICRODISCECTOMY; Right     Comment:  Procedure: RIGHT LUMBAR TWO-THREE/THREE-FOUR               LAMINECTOMY/DECOMPRESSION ;  Surgeon: Newman Pies,               MD;  Location: [place] NEURO ORS;  Service: Neurosurgery;                Laterality: Right;  Right L23 L34 laminectomy and               foraminotomy No date: Nissan fundoplication No date: TONSILLECTOMY  BMI    Body Mass Index: 33.46 kg/m      Reproductive/Obstetrics negative OB ROS                             Anesthesia Physical Anesthesia Plan  ASA: 3  Anesthesia Plan: General   Post-op Pain Management:    Induction: Intravenous  PONV Risk Score and Plan: Propofol infusion and TIVA  Airway Management Planned: Natural Airway  Additional Equipment:   Intra-op Plan:   Post-operative Plan:   Informed Consent: I have reviewed the patients History and Physical, chart, labs and discussed the procedure including the risks, benefits and alternatives for the proposed anesthesia with the patient or authorized representative who has indicated his/her understanding and acceptance.     Dental Advisory Given  Plan Discussed with: CRNA and Surgeon  Anesthesia Plan Comments:        Anesthesia Quick Evaluation

## 2022-11-25 NOTE — Interval H&P Note (Signed)
History and Physical Interval Note: Preprocedure H&P from 11/25/22  was reviewed and there was no interval change after seeing and examining the patient.  Written consent was obtained from the patient after discussion of risks, benefits, and alternatives. Patient has consented to proceed with Colonoscopy with possible intervention   11/25/2022 9:24 AM  Megan Daniels  has presented today for surgery, with the diagnosis of Personal history of colonic polyps (Z86.010).  The various methods of treatment have been discussed with the patient and family. After consideration of risks, benefits and other options for treatment, the patient has consented to  Procedure(s): COLONOSCOPY (N/A) as a surgical intervention.  The patient's history has been reviewed, patient examined, no change in status, stable for surgery.  I have reviewed the patient's chart and labs.  Questions were answered to the patient's satisfaction.     Annamaria Helling

## 2022-11-25 NOTE — Anesthesia Postprocedure Evaluation (Signed)
Anesthesia Post Note  Patient: Megan Daniels  Procedure(s) Performed: COLONOSCOPY  Patient location during evaluation: PACU Anesthesia Type: General Level of consciousness: awake and awake and alert Pain management: satisfactory to patient Vital Signs Assessment: post-procedure vital signs reviewed and stable Respiratory status: nonlabored ventilation and respiratory function stable Cardiovascular status: stable Anesthetic complications: no   No notable events documented.   Last Vitals:  Vitals:   11/25/22 1043 11/25/22 1053  BP: (!) 133/92 (!) 143/77  Pulse: 69 64  Resp: 16 16  Temp:    SpO2: 100% 100%    Last Pain:  Vitals:   11/25/22 1043  TempSrc:   PainSc: 0-No pain                 VAN STAVEREN,Hyman Crossan

## 2022-11-25 NOTE — Op Note (Signed)
[facility] Gastroenterology Patient Name: Megan Daniels Procedure Date: 11/25/2022 9:16 AM MRN: [id number] Account #: [id number] Date of Birth: 1951/06/26 Admit Type: Outpatient Age: 71 Room: [facility] ENDO ROOM 1 Gender: Female Note Status: Finalized Instrument Name: Colonoscope [id number] Procedure:             Colonoscopy Indications:           High risk colon cancer surveillance: Personal history                         of colonic polyps Providers:             Rueben Bash, DO Referring MD:          Leonie Douglas. Doy Hutching, MD (Referring MD) Medicines:             Monitored Anesthesia Care Complications:         No immediate complications. Estimated blood loss:                         Minimal. Procedure:             Pre-Anesthesia Assessment:                        - Prior to the procedure, a History and Physical was                         performed, and patient medications and allergies were                         reviewed. The patient is competent. The risks and                         benefits of the procedure and the sedation options and                         risks were discussed with the patient. All questions                         were answered and informed consent was obtained.                         Patient identification and proposed procedure were                         verified by the physician, the nurse, the anesthetist                         and the technician in the endoscopy suite. Mental                         Status Examination: alert and oriented. Airway                         Examination: normal oropharyngeal airway and neck                         mobility. Respiratory Examination: clear to  auscultation. CV Examination: RRR, no murmurs, no S3                         or S4. Prophylactic Antibiotics: The patient does not                         require prophylactic antibiotics. Prior                          Anticoagulants: The patient has taken no anticoagulant                         or antiplatelet agents. ASA Grade Assessment: III - A                         patient with severe systemic disease. After reviewing                         the risks and benefits, the patient was deemed in                         satisfactory condition to undergo the procedure. The                         anesthesia plan was to use monitored anesthesia care                         (MAC). Immediately prior to administration of                         medications, the patient was re-assessed for adequacy                         to receive sedatives. The heart rate, respiratory                         rate, oxygen saturations, blood pressure, adequacy of                         pulmonary ventilation, and response to care were                         monitored throughout the procedure. The physical                         status of the patient was re-assessed after the                         procedure.                        After obtaining informed consent, the colonoscope was                         passed under direct vision. Throughout the procedure,                         the patient's blood pressure, pulse, and oxygen  saturations were monitored continuously. The                         Colonoscope was introduced through the anus and                         advanced to the the cecum, identified by appendiceal                         orifice and ileocecal valve. The colonoscopy was                         performed without difficulty. The patient tolerated                         the procedure well. The quality of the bowel                         preparation was evaluated using the BBPS [facility] Bowel                         Preparation Scale) with scores of: Right Colon = 2                         (minor amount of residual staining, small fragments of                         stool  and/or opaque liquid, but mucosa seen well),                         Transverse Colon = 3 (entire mucosa seen well with no                         residual staining, small fragments of stool or opaque                         liquid) and Left Colon = 2 (minor amount of residual                         staining, small fragments of stool and/or opaque                         liquid, but mucosa seen well). The total BBPS score                         equals 7. The quality of the bowel preparation was                         good. The ileocecal valve, appendiceal orifice, and                         rectum were photographed. Findings:      The perianal and digital rectal examinations were normal. Pertinent       negatives include normal sphincter tone.      Multiple small-mouthed diverticula were found in the left colon.       Estimated blood loss: none.  Non-bleeding internal hemorrhoids were found during retroflexion. The       hemorrhoids were Grade I (internal hemorrhoids that do not prolapse).       Estimated blood loss: none.      A localized area of granular mucosa was found at the ileocecal valve.       Biopsies were taken with a cold forceps for histology. Estimated blood       loss was minimal.      Six sessile polyps were found in the rectum (4), descending colon (1)       and transverse colon (1). The polyps were 1 to 2 mm in size. These       polyps were removed with a jumbo cold forceps. Resection and retrieval       were complete. Estimated blood loss was minimal.      The exam was otherwise without abnormality on direct and retroflexion       views. Impression:            - Diverticulosis in the left colon.                        - Non-bleeding internal hemorrhoids.                        - Granularity at the ileocecal valve. Biopsied.                        - Six 1 to 2 mm polyps in the rectum, in the                         descending colon and in the transverse colon,  removed                         with a jumbo cold forceps. Resected and retrieved.                        - The examination was otherwise normal on direct and                         retroflexion views. Recommendation:        - Patient has a contact number available for                         emergencies. The signs and symptoms of potential                         delayed complications were discussed with the patient.                         Return to normal activities tomorrow. Written                         discharge instructions were provided to the patient.                        - Discharge patient to home.                        - Resume previous diet.                        -  Continue present medications.                        - No ibuprofen, naproxen, or other non-steroidal                         anti-inflammatory drugs for 5 days after polyp removal.                        - Await pathology results.                        - Repeat colonoscopy for surveillance based on                         pathology results.                        - Return to referring physician as previously                         scheduled.                        - The findings and recommendations were discussed with                         the patient. Procedure Code(s):     --- Professional ---                        [phone], Colonoscopy, flexible; with biopsy, single or                         multiple Diagnosis Code(s):     --- Professional ---                        Z86.010, Personal history of colonic polyps                        K64.0, First degree hemorrhoids                        K63.89, Other specified diseases of intestine                        D12.8, Benign neoplasm of rectum                        D12.4, Benign neoplasm of descending colon                        D12.3, Benign neoplasm of transverse colon (hepatic                         flexure or splenic flexure)                         K57.30, Diverticulosis of large intestine without                         perforation or abscess without bleeding CPT copyright 2022 [employer]. All rights  reserved. The codes documented in this report are preliminary and upon coder review may  be revised to meet current compliance requirements. Attending Participation:      I personally performed the entire procedure. Volney American, DO Annamaria Helling DO, DO 11/25/2022 10:34:50 AM This report has been signed electronically. Number of Addenda: 0 Note Initiated On: 11/25/2022 9:16 AM Scope Withdrawal Time: 0 hours 16 minutes 39 seconds  Total Procedure Duration: 0 hours 22 minutes 53 seconds  Estimated Blood Loss:  Estimated blood loss was minimal.      [facility]

## 2022-11-25 NOTE — Transfer of Care (Signed)
Immediate Anesthesia Transfer of Care Note  Patient: Megan Daniels  Procedure(s) Performed: COLONOSCOPY  Patient Location: PACU  Anesthesia Type:General  Level of Consciousness: awake  Airway & Oxygen Therapy: Patient Spontanous Breathing  Post-op Assessment: Report given to RN and Post -op Vital signs reviewed and stable  Post vital signs: Reviewed and stable  Last Vitals:  Vitals Value Taken Time  BP 113/61 11/25/22 1033  Temp 36 C 11/25/22 1033  Pulse 70 11/25/22 1035  Resp 16 11/25/22 1035  SpO2 100 % 11/25/22 1035  Vitals shown include unvalidated device data.  Last Pain:  Vitals:   11/25/22 1033  TempSrc: Temporal  PainSc: 0-No pain         Complications: No notable events documented.

## 2022-11-28 ENCOUNTER — Encounter: Payer: Self-pay | Admitting: Gastroenterology

## 2022-11-28 LAB — SURGICAL PATHOLOGY

## 2023-06-13 ENCOUNTER — Other Ambulatory Visit: Payer: Self-pay | Admitting: Internal Medicine

## 2023-06-13 DIAGNOSIS — Z1231 Encounter for screening mammogram for malignant neoplasm of breast: Secondary | ICD-10-CM

## 2023-06-21 ENCOUNTER — Other Ambulatory Visit: Payer: Self-pay

## 2023-06-21 ENCOUNTER — Encounter: Payer: Medicare Other | Attending: Pulmonary Disease

## 2023-06-21 DIAGNOSIS — D869 Sarcoidosis, unspecified: Secondary | ICD-10-CM

## 2023-06-21 DIAGNOSIS — D860 Sarcoidosis of lung: Secondary | ICD-10-CM | POA: Insufficient documentation

## 2023-06-21 DIAGNOSIS — R0600 Dyspnea, unspecified: Secondary | ICD-10-CM | POA: Insufficient documentation

## 2023-06-21 NOTE — Progress Notes (Signed)
Virtual Visit completed. Patient informed on EP and RD appointment and 6 Minute walk test. Patient also informed of patient health questionnaires on My Chart. Patient Verbalizes understanding. Visit diagnosis can be found in [facility] 06/13/2023.

## 2023-06-28 VITALS — Ht 62.1 in | Wt 182.2 lb

## 2023-06-28 DIAGNOSIS — D869 Sarcoidosis, unspecified: Secondary | ICD-10-CM

## 2023-06-28 DIAGNOSIS — D860 Sarcoidosis of lung: Secondary | ICD-10-CM | POA: Diagnosis present

## 2023-06-28 DIAGNOSIS — R0600 Dyspnea, unspecified: Secondary | ICD-10-CM | POA: Diagnosis not present

## 2023-06-28 NOTE — Patient Instructions (Addendum)
Patient Instructions  Patient Details  Name: Megan Daniels MRN: [id number] Date of Birth: 1951/07/19 Referring Provider:  Vida Rigger, MD  Below are your personal goals for exercise, nutrition, and risk factors. Our goal is to help you stay on track towards obtaining and maintaining these goals. We will be discussing your progress on these goals with you throughout the program.  Initial Exercise Prescription:  Initial Exercise Prescription - 06/28/23 1500       Date of Initial Exercise RX and Referring Provider   Date 06/28/23    Referring Provider Vida Rigger, MD      Oxygen   Maintain Oxygen Saturation 88% or higher      Recumbant Bike   Level 1    RPM 50    Watts 15    Minutes 15    METs 1.77      NuStep   Level 1    SPM 80    Minutes 15    METs 1.77      Arm Ergometer   Level 1    RPM 30    Minutes 15    METs 1.77      Biostep-RELP   Level 1    SPM 50    Minutes 15    METs 1.77      Track   Laps 14    Minutes 15    METs 1.76      Prescription Details   Frequency (times per week) 3    Duration Progress to 30 minutes of continuous aerobic without signs/symptoms of physical distress      Intensity   THRR 40-80% of Max Heartrate 102-132    Ratings of Perceived Exertion 11-13    Perceived Dyspnea 0-4      Progression   Progression Continue to progress workloads to maintain intensity without signs/symptoms of physical distress.      Resistance Training   Training Prescription Yes    Weight 2 lb    Reps 10-15             Exercise Goals: Frequency: Be able to perform aerobic exercise two to three times per week in program working toward 2-5 days per week of home exercise.  Intensity: Work with a perceived exertion of 11 (fairly light) - 15 (hard) while following your exercise prescription.  We will make changes to your prescription with you as you progress through the program.   Duration: Be able to do 30 to 45 minutes of continuous  aerobic exercise in addition to a 5 minute warm-up and a 5 minute cool-down routine.   Nutrition Goals: Your personal nutrition goals will be established when you do your nutrition analysis with the dietician.  The following are general nutrition guidelines to follow: Cholesterol < 200mg /day Sodium < 1500mg /day Fiber: Women over 50 yrs - 21 grams per day  Personal Goals:  Personal Goals and Risk Factors at Admission - 06/28/23 1539       Core Components/Risk Factors/Patient Goals on Admission    Weight Management Weight Loss;Yes    Intervention Weight Management: Develop a combined nutrition and exercise program designed to reach desired caloric intake, while maintaining appropriate intake of nutrient and fiber, sodium and fats, and appropriate energy expenditure required for the weight goal.;Weight Management: Provide education and appropriate resources to help participant work on and attain dietary goals.;Weight Management/Obesity: Establish reasonable short term and long term weight goals.    Admit Weight 182 lb 3.2 oz (82.6 kg)  Goal Weight: Short Term 172 lb 3.2 oz (78.1 kg)    Goal Weight: Long Term 162 lb 3.2 oz (73.6 kg)    Expected Outcomes Short Term: Continue to assess and modify interventions until short term weight is achieved;Long Term: Adherence to nutrition and physical activity/exercise program aimed toward attainment of established weight goal;Weight Loss: Understanding of general recommendations for a balanced deficit meal plan, which promotes 1-2 lb weight loss per week and includes a negative energy balance of [phone] kcal/d;Understanding recommendations for meals to include 15-35% energy as protein, 25-35% energy from fat, 35-60% energy from carbohydrates, less than 200mg  of dietary cholesterol, 20-35 gm of total fiber daily;Understanding of distribution of calorie intake throughout the day with the consumption of 4-5 meals/snacks    Improve shortness of breath with  ADL's Yes    Intervention Provide education, individualized exercise plan and daily activity instruction to help decrease symptoms of SOB with activities of daily living.    Expected Outcomes Long Term: Be able to perform more ADLs without symptoms or delay the onset of symptoms;Short Term: Improve cardiorespiratory fitness to achieve a reduction of symptoms when performing ADLs    Hypertension Yes    Intervention Provide education on lifestyle modifcations including regular physical activity/exercise, weight management, moderate sodium restriction and increased consumption of fresh fruit, vegetables, and low fat dairy, alcohol moderation, and smoking cessation.;Monitor prescription use compliance.    Expected Outcomes Short Term: Continued assessment and intervention until BP is < 140/17mm HG in hypertensive participants. < 130/54mm HG in hypertensive participants with diabetes, heart failure or chronic kidney disease.;Long Term: Maintenance of blood pressure at goal levels.    Lipids Yes    Intervention Provide education and support for participant on nutrition & aerobic/resistive exercise along with prescribed medications to achieve LDL 70mg , HDL >40mg .    Expected Outcomes Short Term: Participant states understanding of desired cholesterol values and is compliant with medications prescribed. Participant is following exercise prescription and nutrition guidelines.;Long Term: Cholesterol controlled with medications as prescribed, with individualized exercise RX and with personalized nutrition plan. Value goals: LDL < 70mg , HDL > 40 mg.             Tobacco Use Initial Evaluation: Social History   Tobacco Use  Smoking Status Never  Smokeless Tobacco Never    Exercise Goals and Review:  Exercise Goals     Row Name 06/28/23 1536             Exercise Goals   Increase Physical Activity Yes       Intervention Provide advice, education, support and counseling about physical  activity/exercise needs.;Develop an individualized exercise prescription for aerobic and resistive training based on initial evaluation findings, risk stratification, comorbidities and participant's personal goals.       Expected Outcomes Short Term: Attend rehab on a regular basis to increase amount of physical activity.;Long Term: Add in home exercise to make exercise part of routine and to increase amount of physical activity.;Long Term: Exercising regularly at least 3-5 days a week.       Increase Strength and Stamina Yes       Intervention Provide advice, education, support and counseling about physical activity/exercise needs.;Develop an individualized exercise prescription for aerobic and resistive training based on initial evaluation findings, risk stratification, comorbidities and participant's personal goals.       Expected Outcomes Short Term: Increase workloads from initial exercise prescription for resistance, speed, and METs.;Short Term: Perform resistance training exercises routinely during rehab and add  in resistance training at home;Long Term: Improve cardiorespiratory fitness, muscular endurance and strength as measured by increased METs and functional capacity ( )       Able to understand and use rate of perceived exertion (RPE) scale Yes       Intervention Provide education and explanation on how to use RPE scale       Expected Outcomes Short Term: Able to use RPE daily in rehab to express subjective intensity level;Long Term:  Able to use RPE to guide intensity level when exercising independently       Able to understand and use Dyspnea scale Yes       Intervention Provide education and explanation on how to use Dyspnea scale       Expected Outcomes Short Term: Able to use Dyspnea scale daily in rehab to express subjective sense of shortness of breath during exertion;Long Term: Able to use Dyspnea scale to guide intensity level when exercising independently       Knowledge and  understanding of Target Heart Rate Range (THRR) Yes       Intervention Provide education and explanation of THRR including how the numbers were predicted and where they are located for reference       Expected Outcomes Short Term: Able to state/look up THRR;Long Term: Able to use THRR to govern intensity when exercising independently;Short Term: Able to use daily as guideline for intensity in rehab       Able to check pulse independently Yes       Intervention Provide education and demonstration on how to check pulse in carotid and radial arteries.;Review the importance of being able to check your own pulse for safety during independent exercise       Expected Outcomes Short Term: Able to explain why pulse checking is important during independent exercise;Long Term: Able to check pulse independently and accurately       Understanding of Exercise Prescription Yes       Intervention Provide education, explanation, and written materials on patient's individual exercise prescription       Expected Outcomes Short Term: Able to explain program exercise prescription;Long Term: Able to explain home exercise prescription to exercise independently

## 2023-06-28 NOTE — Progress Notes (Signed)
Assessment start time: 2:51 PM  Digestive issues/concerns: no known food allergies   24-hours Recall: B: oatmeal, collagen in coffee L: 1/2 [place] and tomato sandwich D: apple peanut butter  Beverages water (~32oz), coffee Alcohol none Caffeine coffee  Supplements Vitamin D, MVI, elderberry, zinc Intake Patterns Usually small meals, trying to lose weight.   Education r/t nutrition plan Patient drinking 32oz of water daily, spoke about increasing to 48oz. She says sweets are her biggest struggle. Recommended some healthier sweet treat ideas to try. Encouraged her to drink some water before snacking. She reports she is trying to lose weight, doesn't eat a lot but still struggling to lose the weight. Spoke to her about nutrient and calorie density, encouraged her to eat smaller but more nutrient dense foods to help her meet her nutrient goals like protein, fiber, and calories. Reviewed mediterranean diet handout, education on types of fats, sources, and how to read label. Built out several meals and snacks with foods she likes and will eat.   Goal 1: Drink ~3 bottles of water per day Goal 2: Eat a protein and carb at each meal Goal 3: Use small nutrient dense snacks when not very hungry   End time 3:37 PM

## 2023-06-28 NOTE — Progress Notes (Signed)
Pulmonary Individual Treatment Plan  Patient Details  Name: REBECA RATKOWSKI MRN: [id number] Date of Birth: 11/05/1950 Referring Provider:   Flowsheet Row Pulmonary Rehab from 06/28/2023 in [facility] Cardiac and Pulmonary Rehab  Referring Provider Vida Rigger, MD       Initial Encounter Date:  Flowsheet Row Pulmonary Rehab from 06/28/2023 in [facility] Cardiac and Pulmonary Rehab  Date 06/28/23       Visit Diagnosis: Sarcoidosis  Patient's Home Medications on Admission:  Current Outpatient Medications:    acetaminophen (TYLENOL) 500 MG tablet, Take 1,000 mg by mouth every 6 (six) hours as needed for moderate pain or headache., Disp: , Rfl:    albuterol (VENTOLIN HFA) 108 (90 Base) MCG/ACT inhaler, Inhale 2 puffs into the lungs every 4 (four) hours as needed for wheezing or shortness of breath.  (Patient not taking: Reported on 11/25/2022), Disp: , Rfl:    albuterol (VENTOLIN HFA) 108 (90 Base) MCG/ACT inhaler, Inhale into the lungs., Disp: , Rfl:    amLODipine (NORVASC) 5 MG tablet, Take 5 mg by mouth daily., Disp: , Rfl:    aspirin EC 81 MG tablet, Take 1 tablet (81 mg total) by mouth daily. (Patient taking differently: Take 81 mg by mouth every evening.), Disp: , Rfl:    Black Elderberry (SAMBUCUS ELDERBERRY) 50 MG/5ML SYRP, Take by mouth., Disp: , Rfl:    busPIRone (BUSPAR) 10 MG tablet, Take 10 mg by mouth 2 (two) times daily. (Patient not taking: Reported on 06/21/2023), Disp: , Rfl:    carvedilol (COREG) 12.5 MG tablet, Take 12.5 mg by mouth., Disp: , Rfl:    carvedilol (COREG) 12.5 MG tablet, Take by mouth., Disp: , Rfl:    cephALEXin (KEFLEX) 500 MG capsule, Take 500 mg by mouth 3 (three) times daily. (Patient not taking: Reported on 06/21/2023), Disp: , Rfl:    cetirizine (ZYRTEC) 10 MG chewable tablet, Chew by mouth., Disp: , Rfl:    cetirizine (ZYRTEC) 10 MG tablet, Take by mouth. (Patient not taking: Reported on 06/21/2023), Disp: , Rfl:    Cholecalciferol (VITAMIN D) 2000  units CAPS, Take 2,000 Units by mouth daily. , Disp: , Rfl:    Cholecalciferol (VITAMIN D3) 50 MCG (2000 UT) capsule, Take by mouth., Disp: , Rfl:    Collagen Matrix, Porcine, 2 X 2 X 2 CM MISC, Use 1,000 mg once daily, Disp: , Rfl:    cyanocobalamin (VITAMIN B12) 500 MCG tablet, Take by mouth., Disp: , Rfl:    docusate sodium (COLACE) 100 MG capsule, Take 100 mg by mouth 2 (two) times daily. , Disp: , Rfl:    DULoxetine (CYMBALTA) 30 MG capsule, Take by mouth., Disp: , Rfl:    famotidine (PEPCID) 40 MG tablet, Take by mouth., Disp: , Rfl:    fluticasone (FLONASE) 50 MCG/ACT nasal spray, Place into the nose., Disp: , Rfl:    folic acid (FOLVITE) 1 MG tablet, Take by mouth., Disp: , Rfl:    gabapentin (NEURONTIN) 300 MG capsule, Take by mouth. (Patient not taking: Reported on 06/21/2023), Disp: , Rfl:    gabapentin (NEURONTIN) 300 MG capsule, Take by mouth., Disp: , Rfl:    irbesartan (AVAPRO) 300 MG tablet, TAKE 1 TABLET BY MOUTH DAILY, Disp: , Rfl:    irbesartan (AVAPRO) 300 MG tablet, Take by mouth. (Patient not taking: Reported on 06/21/2023), Disp: , Rfl:    losartan (COZAAR) 100 MG tablet, Take 100 mg by mouth at bedtime.  (Patient not taking: Reported on 11/25/2022), Disp: , Rfl:  magnesium oxide (MAG-OX) 400 MG tablet, Take by mouth., Disp: , Rfl:    metaxalone (SKELAXIN) 800 MG tablet, Take by mouth., Disp: , Rfl:    methotrexate (RHEUMATREX) 2.5 MG tablet, Take by mouth., Disp: , Rfl:    metoprolol succinate (TOPROL-XL) 25 MG 24 hr tablet, Take 25 mg by mouth 2 (two) times daily.  (Patient not taking: Reported on 11/25/2022), Disp: , Rfl:    Multiple Vitamin (MULTIVITAMIN WITH MINERALS) TABS tablet, Take 1 tablet by mouth daily., Disp: , Rfl:    omeprazole (PRILOSEC) 40 MG capsule, TAKE 1 CAPSULE DAILY BEFORE BREAKFAST, Disp: 90 capsule, Rfl: 3   omeprazole (PRILOSEC) 40 MG capsule, Take by mouth. (Patient not taking: Reported on 06/21/2023), Disp: , Rfl:    predniSONE (DELTASONE) 2.5  MG tablet, Take by mouth. (Patient not taking: Reported on 06/21/2023), Disp: , Rfl:    sulfamethoxazole-trimethoprim (BACTRIM) 400-80 MG tablet, Take 1 tablet by mouth 3 (three) times a week. (Patient not taking: Reported on 06/21/2023), Disp: , Rfl:    topiramate (TOPAMAX) 50 MG tablet, Take 50 mg by mouth 2 (two) times daily., Disp: , Rfl:    vitamin B-12 (CYANOCOBALAMIN) 500 MCG tablet, Take 500 mcg by mouth daily. , Disp: , Rfl:    vitamin C (ASCORBIC ACID) 500 MG tablet, Take 500 mg by mouth daily., Disp: , Rfl:    Vitamins/Minerals TABS, Take by mouth., Disp: , Rfl:   Past Medical History: Past Medical History:  Diagnosis Date   Abnormal Pap smear of cervix    pos hpv-    Acid reflux    Anxiety    Arthritis    Asthma    Symbicort as needed   Cancer (HCC)    Basal Cell Skin Cancer   Chalazion of left eye    Chronic back pain    spondylolisthesis/stenosis   Chronic UTI    was treated for 3 months with Macrobid and in Dec everything was fine   Constipation    COPD (chronic obstructive pulmonary disease) (HCC)    Diverticulosis    Elevated serum creatinine    with nsaids   Foot drop    right   Genital warts    h/o   GERD (gastroesophageal reflux disease)    takes Prevacid daily   Headache    History of bronchitis    History of colon polyps    History of hiatal hernia    Hx of vertigo    Hypertension    takes Losartan daily   IBS (irritable bowel syndrome)    Joint pain    Palpitations    takes Metoprolol daily   Pneumonia    hx of-early 2000's   PONV (postoperative nausea and vomiting)    extreme nausea and vomiting -none last surgery 10/29/14   Sarcoidosis    Seasonal allergies    takes Claritin daily   Shortness of breath dyspnea    Sleep apnea    cpap 10 yrs   Stress incontinence    Thyroid nodule    URI (upper respiratory infection)    treated self with Mucinex and no fever   Urinary urgency    Vitamin D deficiency    Weakness    numbness and  tingling in both legs    Tobacco Use: Social History   Tobacco Use  Smoking Status Never  Smokeless Tobacco Never    Labs: Review Flowsheet        No data to display  Pulmonary Assessment Scores:  Pulmonary Assessment Scores     Row Name 06/28/23 1537         ADL UCSD   SOB Score total 30     Rest 0     Walk 0     Stairs 1     Bath 1     Dress 1     Shop 1       CAT Score   CAT Score 16       mMRC Score   mMRC Score 1              UCSD: Self-administered rating of dyspnea associated with activities of daily living (ADLs) 6-point scale (0 = "not at all" to 5 = "maximal or unable to do because of breathlessness")  Scoring Scores range from 0 to 120.  Minimally important difference is 5 units  CAT: CAT can identify the health impairment of COPD patients and is better correlated with disease progression.  CAT has a scoring range of zero to 40. The CAT score is classified into four groups of low (less than 10), medium (10 - 20), high (21-30) and very high (31-40) based on the impact level of disease on health status. A CAT score over 10 suggests significant symptoms.  A worsening CAT score could be explained by an exacerbation, poor medication adherence, poor inhaler technique, or progression of COPD or comorbid conditions.  CAT MCID is 2 points  mMRC: mMRC (Modified Medical Research Council) Dyspnea Scale is used to assess the degree of baseline functional disability in patients of respiratory disease due to dyspnea. No minimal important difference is established. A decrease in score of 1 point or greater is considered a positive change.   Pulmonary Function Assessment:  Pulmonary Function Assessment - 06/21/23 1338       Breath   Shortness of Breath Yes;Limiting activity             Exercise Target Goals: Exercise Program Goal: Individual exercise prescription set using results from initial 6 min walk test and THRR while considering   patient's activity barriers and safety.   Exercise Prescription Goal: Initial exercise prescription builds to 30-45 minutes a day of aerobic activity, 2-3 days per week.  Home exercise guidelines will be given to patient during program as part of exercise prescription that the participant will acknowledge.  Education: Aerobic Exercise: - Group verbal and visual presentation on the components of exercise prescription. Introduces F.I.T.T principle from ACSM for exercise prescriptions.  Reviews F.I.T.T. principles of aerobic exercise including progression. Written material given at graduation.   Education: Resistance Exercise: - Group verbal and visual presentation on the components of exercise prescription. Introduces F.I.T.T principle from ACSM for exercise prescriptions  Reviews F.I.T.T. principles of resistance exercise including progression. Written material given at graduation.    Education: Exercise & Equipment Safety: - Individual verbal instruction and demonstration of equipment use and safety with use of the equipment. Flowsheet Row Pulmonary Rehab from 06/28/2023 in [facility] Cardiac and Pulmonary Rehab  Date 06/28/23  Educator MB  Instruction Review Code 1- Verbalizes Understanding       Education: Exercise Physiology & General Exercise Guidelines: - Group verbal and written instruction with models to review the exercise physiology of the cardiovascular system and associated critical values. Provides general exercise guidelines with specific guidelines to those with heart or lung disease.    Education: Flexibility, Balance, Mind/Body Relaxation: - Group verbal and visual presentation with interactive activity on the components  of exercise prescription. Introduces F.I.T.T principle from ACSM for exercise prescriptions. Reviews F.I.T.T. principles of flexibility and balance exercise training including progression. Also discusses the mind body connection.  Reviews various relaxation  techniques to help reduce and manage stress (i.e. Deep breathing, progressive muscle relaxation, and visualization). Balance handout provided to take home. Written material given at graduation.   Activity Barriers & Risk Stratification:  Activity Barriers & Cardiac Risk Stratification - 06/28/23 1528       Activity Barriers & Cardiac Risk Stratification   Activity Barriers Back Problems;Shortness of Breath             6 Minute Walk:  6 Minute Walk     Row Name 06/28/23 1525         6 Minute Walk   Phase Initial     Distance 1040 feet     Walk Time 6 minutes     # of Rest Breaks 0     MPH 1.97     METS 1.77     RPE 13     Perceived Dyspnea  2     VO2 Peak 6.19     Symptoms Yes (comment)     Comments Bilateral hip pain     Resting HR 73 bpm     Resting BP 112/66     Resting Oxygen Saturation  94 %     Exercise Oxygen Saturation  during 6 min walk 91 %     Max Ex. HR 90 bpm     Max Ex. BP 126/66     2 Minute Post BP 110/66       Interval HR   1 Minute HR 67     2 Minute HR 89     3 Minute HR 90     4 Minute HR 90     5 Minute HR 90     6 Minute HR 89     2 Minute Post HR 66     Interval Heart Rate? Yes       Interval Oxygen   Interval Oxygen? Yes     Baseline Oxygen Saturation % 94 %     1 Minute Oxygen Saturation % 93 %     1 Minute Liters of Oxygen 0 L     2 Minute Oxygen Saturation % 91 %     2 Minute Liters of Oxygen 0 L     3 Minute Oxygen Saturation % 92 %     3 Minute Liters of Oxygen 0 L     4 Minute Oxygen Saturation % 92 %     4 Minute Liters of Oxygen 0 L     5 Minute Oxygen Saturation % 93 %     5 Minute Liters of Oxygen 0 L     6 Minute Oxygen Saturation % 95 %     6 Minute Liters of Oxygen 0 L     2 Minute Post Oxygen Saturation % 98 %     2 Minute Post Liters of Oxygen 0 L             Oxygen Initial Assessment:  Oxygen Initial Assessment - 06/21/23 1337       Home Oxygen   Home Oxygen Device None    Sleep Oxygen  Prescription CPAP    Liters per minute 0    Home Exercise Oxygen Prescription None    Home Resting Oxygen Prescription None    Compliance with Home Oxygen Use  Yes      Initial 6 min Walk   Oxygen Used None      Program Oxygen Prescription   Program Oxygen Prescription None      Intervention   Short Term Goals To learn and exhibit compliance with exercise, home and travel O2 prescription;To learn and understand importance of monitoring SPO2 with pulse oximeter and demonstrate accurate use of the pulse oximeter.;To learn and understand importance of maintaining oxygen saturations>88%;To learn and demonstrate proper pursed lip breathing techniques or other breathing techniques. ;To learn and demonstrate proper use of respiratory medications    Long  Term Goals Exhibits compliance with exercise, home  and travel O2 prescription;Verbalizes importance of monitoring SPO2 with pulse oximeter and return demonstration;Maintenance of O2 saturations>88%;Exhibits proper breathing techniques, such as pursed lip breathing or other method taught during program session;Compliance with respiratory medication;Demonstrates proper use of MDI's             Oxygen Re-Evaluation:   Oxygen Discharge (Final Oxygen Re-Evaluation):   Initial Exercise Prescription:  Initial Exercise Prescription - 06/28/23 1500       Date of Initial Exercise RX and Referring Provider   Date 06/28/23    Referring Provider Vida Rigger, MD      Oxygen   Maintain Oxygen Saturation 88% or higher      Recumbant Bike   Level 1    RPM 50    Watts 15    Minutes 15    METs 1.77      NuStep   Level 1    SPM 80    Minutes 15    METs 1.77      Arm Ergometer   Level 1    RPM 30    Minutes 15    METs 1.77      Biostep-RELP   Level 1    SPM 50    Minutes 15    METs 1.77      Track   Laps 14    Minutes 15    METs 1.76      Prescription Details   Frequency (times per week) 3    Duration Progress to 30  minutes of continuous aerobic without signs/symptoms of physical distress      Intensity   THRR 40-80% of Max Heartrate 102-132    Ratings of Perceived Exertion 11-13    Perceived Dyspnea 0-4      Progression   Progression Continue to progress workloads to maintain intensity without signs/symptoms of physical distress.      Resistance Training   Training Prescription Yes    Weight 2 lb    Reps 10-15             Perform Capillary Blood Glucose checks as needed.  Exercise Prescription Changes:   Exercise Prescription Changes     Row Name 06/28/23 1500             Response to Exercise   Blood Pressure (Admit) 112/66       Blood Pressure (Exercise) 126/66       Blood Pressure (Exit) 110/66       Heart Rate (Admit) 73 bpm       Heart Rate (Exercise) 90 bpm       Heart Rate (Exit) 66 bpm       Oxygen Saturation (Admit) 94 %       Oxygen Saturation (Exercise) 91 %       Oxygen Saturation (Exit) 98 %  Rating of Perceived Exertion (Exercise) 13       Perceived Dyspnea (Exercise) 2       Symptoms bilateral hip pain       Comments results       Duration Progress to 30 minutes of  aerobic without signs/symptoms of physical distress       Intensity THRR New         Progression   Progression Continue to progress workloads to maintain intensity without signs/symptoms of physical distress.       Average METs 1.77                Exercise Comments:   Exercise Goals and Review:   Exercise Goals     Row Name 06/28/23 1536             Exercise Goals   Increase Physical Activity Yes       Intervention Provide advice, education, support and counseling about physical activity/exercise needs.;Develop an individualized exercise prescription for aerobic and resistive training based on initial evaluation findings, risk stratification, comorbidities and participant's personal goals.       Expected Outcomes Short Term: Attend rehab on a regular basis to  increase amount of physical activity.;Long Term: Add in home exercise to make exercise part of routine and to increase amount of physical activity.;Long Term: Exercising regularly at least 3-5 days a week.       Increase Strength and Stamina Yes       Intervention Provide advice, education, support and counseling about physical activity/exercise needs.;Develop an individualized exercise prescription for aerobic and resistive training based on initial evaluation findings, risk stratification, comorbidities and participant's personal goals.       Expected Outcomes Short Term: Increase workloads from initial exercise prescription for resistance, speed, and METs.;Short Term: Perform resistance training exercises routinely during rehab and add in resistance training at home;Long Term: Improve cardiorespiratory fitness, muscular endurance and strength as measured by increased METs and functional capacity ( )       Able to understand and use rate of perceived exertion (RPE) scale Yes       Intervention Provide education and explanation on how to use RPE scale       Expected Outcomes Short Term: Able to use RPE daily in rehab to express subjective intensity level;Long Term:  Able to use RPE to guide intensity level when exercising independently       Able to understand and use Dyspnea scale Yes       Intervention Provide education and explanation on how to use Dyspnea scale       Expected Outcomes Short Term: Able to use Dyspnea scale daily in rehab to express subjective sense of shortness of breath during exertion;Long Term: Able to use Dyspnea scale to guide intensity level when exercising independently       Knowledge and understanding of Target Heart Rate Range (THRR) Yes       Intervention Provide education and explanation of THRR including how the numbers were predicted and where they are located for reference       Expected Outcomes Short Term: Able to state/look up THRR;Long Term: Able to use THRR to  govern intensity when exercising independently;Short Term: Able to use daily as guideline for intensity in rehab       Able to check pulse independently Yes       Intervention Provide education and demonstration on how to check pulse in carotid and radial arteries.;Review the importance of being  able to check your own pulse for safety during independent exercise       Expected Outcomes Short Term: Able to explain why pulse checking is important during independent exercise;Long Term: Able to check pulse independently and accurately       Understanding of Exercise Prescription Yes       Intervention Provide education, explanation, and written materials on patient's individual exercise prescription       Expected Outcomes Short Term: Able to explain program exercise prescription;Long Term: Able to explain home exercise prescription to exercise independently                Exercise Goals Re-Evaluation :   Discharge Exercise Prescription (Final Exercise Prescription Changes):  Exercise Prescription Changes - 06/28/23 1500       Response to Exercise   Blood Pressure (Admit) 112/66    Blood Pressure (Exercise) 126/66    Blood Pressure (Exit) 110/66    Heart Rate (Admit) 73 bpm    Heart Rate (Exercise) 90 bpm    Heart Rate (Exit) 66 bpm    Oxygen Saturation (Admit) 94 %    Oxygen Saturation (Exercise) 91 %    Oxygen Saturation (Exit) 98 %    Rating of Perceived Exertion (Exercise) 13    Perceived Dyspnea (Exercise) 2    Symptoms bilateral hip pain    Comments results    Duration Progress to 30 minutes of  aerobic without signs/symptoms of physical distress    Intensity THRR New      Progression   Progression Continue to progress workloads to maintain intensity without signs/symptoms of physical distress.    Average METs 1.77             Nutrition:  Target Goals: Understanding of nutrition guidelines, daily intake of sodium 1500mg , cholesterol 200mg , calories 30% from  fat and 7% or less from saturated fats, daily to have 5 or more servings of fruits and vegetables.  Education: All About Nutrition: -Group instruction provided by verbal, written material, interactive activities, discussions, models, and posters to present general guidelines for heart healthy nutrition including fat, fiber, MyPlate, the role of sodium in heart healthy nutrition, utilization of the nutrition label, and utilization of this knowledge for meal planning. Follow up email sent as well. Written material given at graduation.   Biometrics:  Pre Biometrics - 06/28/23 1537       Pre Biometrics   Height 5' 2.1" (1.577 m)    Weight 182 lb 3.2 oz (82.6 kg)    Waist Circumference 43 inches    Hip Circumference 45.5 inches    Waist to Hip Ratio 0.95 %    BMI (Calculated) 33.23    Single Leg Stand 12 seconds              Nutrition Therapy Plan and Nutrition Goals:   Nutrition Assessments:  MEDIFICTS Score Key: >=70 Need to make dietary changes  40-70 Heart Healthy Diet <= 40 Therapeutic Level Cholesterol Diet  Flowsheet Row Pulmonary Rehab from 06/28/2023 in [facility] Cardiac and Pulmonary Rehab  Picture Your Plate Total Score on Admission 72      Picture Your Plate Scores: <40 Unhealthy dietary pattern with much room for improvement. 41-50 Dietary pattern unlikely to meet recommendations for good health and room for improvement. 51-60 More healthful dietary pattern, with some room for improvement.  >60 Healthy dietary pattern, although there may be some specific behaviors that could be improved.   Nutrition Goals Re-Evaluation:   Nutrition  Goals Discharge (Final Nutrition Goals Re-Evaluation):   Psychosocial: Target Goals: Acknowledge presence or absence of significant depression and/or stress, maximize coping skills, provide positive support system. Participant is able to verbalize types and ability to use techniques and skills needed for reducing stress and  depression.   Education: Stress, Anxiety, and Depression - Group verbal and visual presentation to define topics covered.  Reviews how body is impacted by stress, anxiety, and depression.  Also discusses healthy ways to reduce stress and to treat/manage anxiety and depression.  Written material given at graduation.   Education: Sleep Hygiene -Provides group verbal and written instruction about how sleep can affect your health.  Define sleep hygiene, discuss sleep cycles and impact of sleep habits. Review good sleep hygiene tips.    Initial Review & Psychosocial Screening:  Initial Psych Review & Screening - 06/21/23 1339       Initial Review   Current issues with Current Anxiety/Panic      Family Dynamics   Good Support System? Yes    Comments Her husband has some dementia and other health issues. She can call her daughters if she gets anxiety she has some medication for it.      Barriers   Psychosocial barriers to participate in program The patient should benefit from training in stress management and relaxation.      Screening Interventions   Interventions Encouraged to exercise;To provide support and resources with identified psychosocial needs;Provide feedback about the scores to participant    Expected Outcomes Short Term goal: Utilizing psychosocial counselor, staff and physician to assist with identification of specific Stressors or current issues interfering with healing process. Setting desired goal for each stressor or current issue identified.;Long Term Goal: Stressors or current issues are controlled or eliminated.;Short Term goal: Identification and review with participant of any Quality of Life or Depression concerns found by scoring the questionnaire.;Long Term goal: The participant improves quality of Life and PHQ9 Scores as seen by post scores and/or verbalization of changes             Quality of Life Scores:  Scores of 19 and below usually indicate a poorer  quality of life in these areas.  A difference of  2-3 points is a clinically meaningful difference.  A difference of 2-3 points in the total score of the Quality of Life Index has been associated with significant improvement in overall quality of life, self-image, physical symptoms, and general health in studies assessing change in quality of life.  PHQ-9: Review Flowsheet       06/28/2023  Depression screen PHQ 2/9  Decreased Interest 0  Down, Depressed, Hopeless 0  PHQ - 2 Score 0  Altered sleeping 0  Tired, decreased energy 1  Change in appetite 1  Feeling bad or failure about yourself  1  Trouble concentrating 0  Moving slowly or fidgety/restless 0  Suicidal thoughts 0  PHQ-9 Score 3    Details           Interpretation of Total Score  Total Score Depression Severity:  1-4 = Minimal depression, 5-9 = Mild depression, 10-14 = Moderate depression, 15-19 = Moderately severe depression, 20-27 = Severe depression   Psychosocial Evaluation and Intervention:  Psychosocial Evaluation - 06/21/23 1342       Psychosocial Evaluation & Interventions   Interventions Encouraged to exercise with the program and follow exercise prescription;Relaxation education;Stress management education    Comments Her husband has some dementia and other health issues. She can call  her daughters if she gets anxiety she has some medication for it.    Expected Outcomes Short: Start [facility] to help with mood. Long: Maintain a healthy mental state.    Continue Psychosocial Services  Follow up required by staff             Psychosocial Re-Evaluation:   Psychosocial Discharge (Final Psychosocial Re-Evaluation):   Education: Education Goals: Education classes will be provided on a weekly basis, covering required topics. Participant will state understanding/return demonstration of topics presented.  Learning Barriers/Preferences:  Learning Barriers/Preferences - 06/21/23 1338       Learning  Barriers/Preferences   Learning Barriers Hearing    Learning Preferences None             General Pulmonary Education Topics:  Infection Prevention: - Provides verbal and written material to individual with discussion of infection control including proper hand washing and proper equipment cleaning during exercise session. Flowsheet Row Pulmonary Rehab from 06/28/2023 in [facility]  Date 06/28/23  Educator MB  Instruction Review Code 1- Verbalizes Understanding       Falls Prevention: - Provides verbal and written material to individual with discussion of falls prevention and safety. Flowsheet Row Pulmonary Rehab from 06/28/2023 in [facility] Pulmonary Re[facility]  Date 06/28/23  Educator MB  Instruction Review Code 1- Verbalizes Understanding       Chronic Lung Disease Review: - Group verbal instruction with posters, models, PowerPoint presentations and videos,  to review new updates, new respiratory medications, new advancements in procedures and treatments. Providing information on websites and "800" numbers for continued self-education. Includes information about supplement oxygen, available portable oxygen systems, continuous and intermittent flow rates, oxygen safety, concentrators, and Medicare reimbursement for oxygen. Explanation of Pulmonary Drugs, including class, frequency, complications, importance of spacers, rinsing mouth after steroid MDI's, and proper cleaning methods for nebulizers. Review of basic lung anatomy and physiology related to function, structure, and complications of lung disease. Review of risk factors. Discussion about methods for diagnosing sleep apnea and types of masks and machines for OSA. Includes a review of the use of types of environmental controls: home humidity, furnaces, filters, dust mite/pet prevention, HEPA vacuums. Discussion about weather changes, air quality and the benefits of nasal washing. Instruction on Warning  signs, infection symptoms, calling MD promptly, preventive modes, and value of vaccinations. Review of effective airway clearance, coughing and/or vibration techniques. Emphasizing that all should Create an Action Plan. Written material given at graduation. Flowsheet Row Pulmonary Rehab from 06/28/2023 in [facility] Pulmonary Re[facility]  Education need identified 06/28/23       AED/CPR: - Group verbal and written instruction with the use of models to demonstrate the basic use of the AED with the basic ABC's of resuscitation.    Anatomy and Cardiac Procedures: - Group verbal and visual presentation and models provide information about basic cardiac anatomy and function. Reviews the testing methods done to diagnose heart disease and the outcomes of the test results. Describes the treatment choices: Medical Management, Angioplasty, or Coronary Bypass Surgery for treating various heart conditions including Myocardial Infarction, Angina, Valve Disease, and Cardiac Arrhythmias.  Written material given at graduation.   Medication Safety: - Group verbal and visual instruction to review commonly prescribed medications for heart and lung disease. Reviews the medication, class of the drug, and side effects. Includes the steps to properly store meds and maintain the prescription regimen.  Written material given at graduation.   Other: -Provides group and  verbal instruction on various topics (see comments)   Knowledge Questionnaire Score:  Knowledge Questionnaire Score - 06/28/23 1554       Knowledge Questionnaire Score   Pre Score 16/18              Core Components/Risk Factors/Patient Goals at Admission:  Personal Goals and Risk Factors at Admission - 06/28/23 1539       Core Components/Risk Factors/Patient Goals on Admission    Weight Management Weight Loss;Yes    Intervention Weight Management: Develop a combined nutrition and exercise program designed to reach desired caloric  intake, while maintaining appropriate intake of nutrient and fiber, sodium and fats, and appropriate energy expenditure required for the weight goal.;Weight Management: Provide education and appropriate resources to help participant work on and attain dietary goals.;Weight Management/Obesity: Establish reasonable short term and long term weight goals.    Admit Weight 182 lb 3.2 oz (82.6 kg)    Goal Weight: Short Term 172 lb 3.2 oz (78.1 kg)    Goal Weight: Long Term 162 lb 3.2 oz (73.6 kg)    Expected Outcomes Short Term: Continue to assess and modify interventions until short term weight is achieved;Long Term: Adherence to nutrition and physical activity/exercise program aimed toward attainment of established weight goal;Weight Loss: Understanding of general recommendations for a balanced deficit meal plan, which promotes 1-2 lb weight loss per week and includes a negative energy balance of [phone] kcal/d;Understanding recommendations for meals to include 15-35% energy as protein, 25-35% energy from fat, 35-60% energy from carbohydrates, less than 200mg  of dietary cholesterol, 20-35 gm of total fiber daily;Understanding of distribution of calorie intake throughout the day with the consumption of 4-5 meals/snacks    Improve shortness of breath with ADL's Yes    Intervention Provide education, individualized exercise plan and daily activity instruction to help decrease symptoms of SOB with activities of daily living.    Expected Outcomes Long Term: Be able to perform more ADLs without symptoms or delay the onset of symptoms;Short Term: Improve cardiorespiratory fitness to achieve a reduction of symptoms when performing ADLs    Hypertension Yes    Intervention Provide education on lifestyle modifcations including regular physical activity/exercise, weight management, moderate sodium restriction and increased consumption of fresh fruit, vegetables, and low fat dairy, alcohol moderation, and smoking  cessation.;Monitor prescription use compliance.    Expected Outcomes Short Term: Continued assessment and intervention until BP is < 140/5mm HG in hypertensive participants. < 130/63mm HG in hypertensive participants with diabetes, heart failure or chronic kidney disease.;Long Term: Maintenance of blood pressure at goal levels.    Lipids Yes    Intervention Provide education and support for participant on nutrition & aerobic/resistive exercise along with prescribed medications to achieve LDL 70mg , HDL >40mg .    Expected Outcomes Short Term: Participant states understanding of desired cholesterol values and is compliant with medications prescribed. Participant is following exercise prescription and nutrition guidelines.;Long Term: Cholesterol controlled with medications as prescribed, with individualized exercise RX and with personalized nutrition plan. Value goals: LDL < 70mg , HDL > 40 mg.             Education:Diabetes - Individual verbal and written instruction to review signs/symptoms of diabetes, desired ranges of glucose level fasting, after meals and with exercise. Acknowledge that pre and post exercise glucose checks will be done for 3 sessions at entry of program.   Know Your Numbers and Heart Failure: - Group verbal and visual instruction to discuss disease risk factors for cardiac and  pulmonary disease and treatment options.  Reviews associated critical values for Overweight/Obesity, Hypertension, Cholesterol, and Diabetes.  Discusses basics of heart failure: signs/symptoms and treatments.  Introduces Heart Failure Zone chart for action plan for heart failure.  Written material given at graduation.   Core Components/Risk Factors/Patient Goals Review:    Core Components/Risk Factors/Patient Goals at Discharge (Final Review):    ITP Comments:  ITP Comments     Row Name 06/21/23 1336 06/28/23 1556         ITP Comments Virtual Visit completed. Patient informed on EP and RD  appointment and 6 Minute walk test. Patient also informed of patient health questionnaires on My Chart. Patient Verbalizes understanding. Visit diagnosis can be found in [facility] 06/13/[facility]23. Completed and gym orientation. Initial ITP created and sent for review to Dr. Jinny Sanders, Medical Director.               Comments: Initial ITP

## 2023-07-05 ENCOUNTER — Encounter: Payer: Self-pay | Admitting: *Deleted

## 2023-07-05 DIAGNOSIS — D869 Sarcoidosis, unspecified: Secondary | ICD-10-CM

## 2023-07-05 NOTE — Progress Notes (Signed)
Pulmonary Individual Treatment Plan  Patient Details  Name: Megan Daniels MRN: [id number] Date of Birth: 11/27/1950 Referring Provider:   Flowsheet Row Pulmonary Rehab from 06/28/2023 in [facility] Cardiac and Pulmonary Rehab  Referring Provider Vida Rigger, MD       Initial Encounter Date:  Flowsheet Row Pulmonary Rehab from 06/28/2023 in [facility] Cardiac and Pulmonary Rehab  Date 06/28/23       Visit Diagnosis: Sarcoidosis  Patient's Home Medications on Admission:  Current Outpatient Medications:    acetaminophen (TYLENOL) 500 MG tablet, Take 1,000 mg by mouth every 6 (six) hours as needed for moderate pain or headache., Disp: , Rfl:    albuterol (VENTOLIN HFA) 108 (90 Base) MCG/ACT inhaler, Inhale 2 puffs into the lungs every 4 (four) hours as needed for wheezing or shortness of breath.  (Patient not taking: Reported on 11/25/2022), Disp: , Rfl:    albuterol (VENTOLIN HFA) 108 (90 Base) MCG/ACT inhaler, Inhale into the lungs., Disp: , Rfl:    amLODipine (NORVASC) 5 MG tablet, Take 5 mg by mouth daily., Disp: , Rfl:    aspirin EC 81 MG tablet, Take 1 tablet (81 mg total) by mouth daily. (Patient taking differently: Take 81 mg by mouth every evening.), Disp: , Rfl:    Black Elderberry (SAMBUCUS ELDERBERRY) 50 MG/5ML SYRP, Take by mouth., Disp: , Rfl:    busPIRone (BUSPAR) 10 MG tablet, Take 10 mg by mouth 2 (two) times daily. (Patient not taking: Reported on 06/21/2023), Disp: , Rfl:    carvedilol (COREG) 12.5 MG tablet, Take 12.5 mg by mouth., Disp: , Rfl:    carvedilol (COREG) 12.5 MG tablet, Take by mouth., Disp: , Rfl:    cephALEXin (KEFLEX) 500 MG capsule, Take 500 mg by mouth 3 (three) times daily. (Patient not taking: Reported on 06/21/2023), Disp: , Rfl:    cetirizine (ZYRTEC) 10 MG chewable tablet, Chew by mouth., Disp: , Rfl:    cetirizine (ZYRTEC) 10 MG tablet, Take by mouth. (Patient not taking: Reported on 06/21/2023), Disp: , Rfl:    Cholecalciferol (VITAMIN D) 2000  units CAPS, Take 2,000 Units by mouth daily. , Disp: , Rfl:    Cholecalciferol (VITAMIN D3) 50 MCG (2000 UT) capsule, Take by mouth., Disp: , Rfl:    Collagen Matrix, Porcine, 2 X 2 X 2 CM MISC, Use 1,000 mg once daily, Disp: , Rfl:    cyanocobalamin (VITAMIN B12) 500 MCG tablet, Take by mouth., Disp: , Rfl:    docusate sodium (COLACE) 100 MG capsule, Take 100 mg by mouth 2 (two) times daily. , Disp: , Rfl:    DULoxetine (CYMBALTA) 30 MG capsule, Take by mouth., Disp: , Rfl:    famotidine (PEPCID) 40 MG tablet, Take by mouth., Disp: , Rfl:    fluticasone (FLONASE) 50 MCG/ACT nasal spray, Place into the nose., Disp: , Rfl:    folic acid (FOLVITE) 1 MG tablet, Take by mouth., Disp: , Rfl:    gabapentin (NEURONTIN) 300 MG capsule, Take by mouth. (Patient not taking: Reported on 06/21/2023), Disp: , Rfl:    gabapentin (NEURONTIN) 300 MG capsule, Take by mouth., Disp: , Rfl:    irbesartan (AVAPRO) 300 MG tablet, TAKE 1 TABLET BY MOUTH DAILY, Disp: , Rfl:    irbesartan (AVAPRO) 300 MG tablet, Take by mouth. (Patient not taking: Reported on 06/21/2023), Disp: , Rfl:    losartan (COZAAR) 100 MG tablet, Take 100 mg by mouth at bedtime.  (Patient not taking: Reported on 11/25/2022), Disp: , Rfl:  magnesium oxide (MAG-OX) 400 MG tablet, Take by mouth., Disp: , Rfl:    metaxalone (SKELAXIN) 800 MG tablet, Take by mouth., Disp: , Rfl:    methotrexate (RHEUMATREX) 2.5 MG tablet, Take by mouth., Disp: , Rfl:    metoprolol succinate (TOPROL-XL) 25 MG 24 hr tablet, Take 25 mg by mouth 2 (two) times daily.  (Patient not taking: Reported on 11/25/2022), Disp: , Rfl:    Multiple Vitamin (MULTIVITAMIN WITH MINERALS) TABS tablet, Take 1 tablet by mouth daily., Disp: , Rfl:    omeprazole (PRILOSEC) 40 MG capsule, TAKE 1 CAPSULE DAILY BEFORE BREAKFAST, Disp: 90 capsule, Rfl: 3   omeprazole (PRILOSEC) 40 MG capsule, Take by mouth. (Patient not taking: Reported on 06/21/2023), Disp: , Rfl:    predniSONE (DELTASONE) 2.5  MG tablet, Take by mouth. (Patient not taking: Reported on 06/21/2023), Disp: , Rfl:    sulfamethoxazole-trimethoprim (BACTRIM) 400-80 MG tablet, Take 1 tablet by mouth 3 (three) times a week. (Patient not taking: Reported on 06/21/2023), Disp: , Rfl:    topiramate (TOPAMAX) 50 MG tablet, Take 50 mg by mouth 2 (two) times daily., Disp: , Rfl:    vitamin B-12 (CYANOCOBALAMIN) 500 MCG tablet, Take 500 mcg by mouth daily. , Disp: , Rfl:    vitamin C (ASCORBIC ACID) 500 MG tablet, Take 500 mg by mouth daily., Disp: , Rfl:    Vitamins/Minerals TABS, Take by mouth., Disp: , Rfl:   Past Medical History: Past Medical History:  Diagnosis Date   Abnormal Pap smear of cervix    pos hpv-    Acid reflux    Anxiety    Arthritis    Asthma    Symbicort as needed   Cancer (HCC)    Basal Cell Skin Cancer   Chalazion of left eye    Chronic back pain    spondylolisthesis/stenosis   Chronic UTI    was treated for 3 months with Macrobid and in Dec everything was fine   Constipation    COPD (chronic obstructive pulmonary disease) (HCC)    Diverticulosis    Elevated serum creatinine    with nsaids   Foot drop    right   Genital warts    h/o   GERD (gastroesophageal reflux disease)    takes Prevacid daily   Headache    History of bronchitis    History of colon polyps    History of hiatal hernia    Hx of vertigo    Hypertension    takes Losartan daily   IBS (irritable bowel syndrome)    Joint pain    Palpitations    takes Metoprolol daily   Pneumonia    hx of-early 2000's   PONV (postoperative nausea and vomiting)    extreme nausea and vomiting -none last surgery 10/29/14   Sarcoidosis    Seasonal allergies    takes Claritin daily   Shortness of breath dyspnea    Sleep apnea    cpap 10 yrs   Stress incontinence    Thyroid nodule    URI (upper respiratory infection)    treated self with Mucinex and no fever   Urinary urgency    Vitamin D deficiency    Weakness    numbness and  tingling in both legs    Tobacco Use: Social History   Tobacco Use  Smoking Status Never  Smokeless Tobacco Never    Labs: Review Flowsheet        No data to display  Pulmonary Assessment Scores:  Pulmonary Assessment Scores     Row Name 06/28/23 1537         ADL UCSD   SOB Score total 30     Rest 0     Walk 0     Stairs 1     Bath 1     Dress 1     Shop 1       CAT Score   CAT Score 16       mMRC Score   mMRC Score 1              UCSD: Self-administered rating of dyspnea associated with activities of daily living (ADLs) 6-point scale (0 = "not at all" to 5 = "maximal or unable to do because of breathlessness")  Scoring Scores range from 0 to 120.  Minimally important difference is 5 units  CAT: CAT can identify the health impairment of COPD patients and is better correlated with disease progression.  CAT has a scoring range of zero to 40. The CAT score is classified into four groups of low (less than 10), medium (10 - 20), high (21-30) and very high (31-40) based on the impact level of disease on health status. A CAT score over 10 suggests significant symptoms.  A worsening CAT score could be explained by an exacerbation, poor medication adherence, poor inhaler technique, or progression of COPD or comorbid conditions.  CAT MCID is 2 points  mMRC: mMRC (Modified Medical Research Council) Dyspnea Scale is used to assess the degree of baseline functional disability in patients of respiratory disease due to dyspnea. No minimal important difference is established. A decrease in score of 1 point or greater is considered a positive change.   Pulmonary Function Assessment:  Pulmonary Function Assessment - 06/21/23 1338       Breath   Shortness of Breath Yes;Limiting activity             Exercise Target Goals: Exercise Program Goal: Individual exercise prescription set using results from initial 6 min walk test and THRR while considering   patient's activity barriers and safety.   Exercise Prescription Goal: Initial exercise prescription builds to 30-45 minutes a day of aerobic activity, 2-3 days per week.  Home exercise guidelines will be given to patient during program as part of exercise prescription that the participant will acknowledge.  Education: Aerobic Exercise: - Group verbal and visual presentation on the components of exercise prescription. Introduces F.I.T.T principle from ACSM for exercise prescriptions.  Reviews F.I.T.T. principles of aerobic exercise including progression. Written material given at graduation.   Education: Resistance Exercise: - Group verbal and visual presentation on the components of exercise prescription. Introduces F.I.T.T principle from ACSM for exercise prescriptions  Reviews F.I.T.T. principles of resistance exercise including progression. Written material given at graduation.    Education: Exercise & Equipment Safety: - Individual verbal instruction and demonstration of equipment use and safety with use of the equipment. Flowsheet Row Pulmonary Rehab from 06/28/2023 in [facility] Cardiac and Pulmonary Rehab  Date 06/28/23  Educator MB  Instruction Review Code 1- Verbalizes Understanding       Education: Exercise Physiology & General Exercise Guidelines: - Group verbal and written instruction with models to review the exercise physiology of the cardiovascular system and associated critical values. Provides general exercise guidelines with specific guidelines to those with heart or lung disease.    Education: Flexibility, Balance, Mind/Body Relaxation: - Group verbal and visual presentation with interactive activity on the components  of exercise prescription. Introduces F.I.T.T principle from ACSM for exercise prescriptions. Reviews F.I.T.T. principles of flexibility and balance exercise training including progression. Also discusses the mind body connection.  Reviews various relaxation  techniques to help reduce and manage stress (i.e. Deep breathing, progressive muscle relaxation, and visualization). Balance handout provided to take home. Written material given at graduation.   Activity Barriers & Risk Stratification:  Activity Barriers & Cardiac Risk Stratification - 06/28/23 1528       Activity Barriers & Cardiac Risk Stratification   Activity Barriers Back Problems;Shortness of Breath             6 Minute Walk:  6 Minute Walk     Row Name 06/28/23 1525         6 Minute Walk   Phase Initial     Distance 1040 feet     Walk Time 6 minutes     # of Rest Breaks 0     MPH 1.97     METS 1.77     RPE 13     Perceived Dyspnea  2     VO2 Peak 6.19     Symptoms Yes (comment)     Comments Bilateral hip pain     Resting HR 73 bpm     Resting BP 112/66     Resting Oxygen Saturation  94 %     Exercise Oxygen Saturation  during 6 min walk 91 %     Max Ex. HR 90 bpm     Max Ex. BP 126/66     2 Minute Post BP 110/66       Interval HR   1 Minute HR 67     2 Minute HR 89     3 Minute HR 90     4 Minute HR 90     5 Minute HR 90     6 Minute HR 89     2 Minute Post HR 66     Interval Heart Rate? Yes       Interval Oxygen   Interval Oxygen? Yes     Baseline Oxygen Saturation % 94 %     1 Minute Oxygen Saturation % 93 %     1 Minute Liters of Oxygen 0 L     2 Minute Oxygen Saturation % 91 %     2 Minute Liters of Oxygen 0 L     3 Minute Oxygen Saturation % 92 %     3 Minute Liters of Oxygen 0 L     4 Minute Oxygen Saturation % 92 %     4 Minute Liters of Oxygen 0 L     5 Minute Oxygen Saturation % 93 %     5 Minute Liters of Oxygen 0 L     6 Minute Oxygen Saturation % 95 %     6 Minute Liters of Oxygen 0 L     2 Minute Post Oxygen Saturation % 98 %     2 Minute Post Liters of Oxygen 0 L             Oxygen Initial Assessment:  Oxygen Initial Assessment - 06/21/23 1337       Home Oxygen   Home Oxygen Device None    Sleep Oxygen  Prescription CPAP    Liters per minute 0    Home Exercise Oxygen Prescription None    Home Resting Oxygen Prescription None    Compliance with Home Oxygen Use  Yes      Initial 6 min Walk   Oxygen Used None      Program Oxygen Prescription   Program Oxygen Prescription None      Intervention   Short Term Goals To learn and exhibit compliance with exercise, home and travel O2 prescription;To learn and understand importance of monitoring SPO2 with pulse oximeter and demonstrate accurate use of the pulse oximeter.;To learn and understand importance of maintaining oxygen saturations>88%;To learn and demonstrate proper pursed lip breathing techniques or other breathing techniques. ;To learn and demonstrate proper use of respiratory medications    Long  Term Goals Exhibits compliance with exercise, home  and travel O2 prescription;Verbalizes importance of monitoring SPO2 with pulse oximeter and return demonstration;Maintenance of O2 saturations>88%;Exhibits proper breathing techniques, such as pursed lip breathing or other method taught during program session;Compliance with respiratory medication;Demonstrates proper use of MDI's             Oxygen Re-Evaluation:   Oxygen Discharge (Final Oxygen Re-Evaluation):   Initial Exercise Prescription:  Initial Exercise Prescription - 06/28/23 1500       Date of Initial Exercise RX and Referring Provider   Date 06/28/23    Referring Provider Vida Rigger, MD      Oxygen   Maintain Oxygen Saturation 88% or higher      Recumbant Bike   Level 1    RPM 50    Watts 15    Minutes 15    METs 1.77      NuStep   Level 1    SPM 80    Minutes 15    METs 1.77      Arm Ergometer   Level 1    RPM 30    Minutes 15    METs 1.77      Biostep-RELP   Level 1    SPM 50    Minutes 15    METs 1.77      Track   Laps 14    Minutes 15    METs 1.76      Prescription Details   Frequency (times per week) 3    Duration Progress to 30  minutes of continuous aerobic without signs/symptoms of physical distress      Intensity   THRR 40-80% of Max Heartrate 102-132    Ratings of Perceived Exertion 11-13    Perceived Dyspnea 0-4      Progression   Progression Continue to progress workloads to maintain intensity without signs/symptoms of physical distress.      Resistance Training   Training Prescription Yes    Weight 2 lb    Reps 10-15             Perform Capillary Blood Glucose checks as needed.  Exercise Prescription Changes:   Exercise Prescription Changes     Row Name 06/28/23 1500             Response to Exercise   Blood Pressure (Admit) 112/66       Blood Pressure (Exercise) 126/66       Blood Pressure (Exit) 110/66       Heart Rate (Admit) 73 bpm       Heart Rate (Exercise) 90 bpm       Heart Rate (Exit) 66 bpm       Oxygen Saturation (Admit) 94 %       Oxygen Saturation (Exercise) 91 %       Oxygen Saturation (Exit) 98 %  Rating of Perceived Exertion (Exercise) 13       Perceived Dyspnea (Exercise) 2       Symptoms bilateral hip pain       Comments results       Duration Progress to 30 minutes of  aerobic without signs/symptoms of physical distress       Intensity THRR New         Progression   Progression Continue to progress workloads to maintain intensity without signs/symptoms of physical distress.       Average METs 1.77                Exercise Comments:   Exercise Goals and Review:   Exercise Goals     Row Name 06/28/23 1536             Exercise Goals   Increase Physical Activity Yes       Intervention Provide advice, education, support and counseling about physical activity/exercise needs.;Develop an individualized exercise prescription for aerobic and resistive training based on initial evaluation findings, risk stratification, comorbidities and participant's personal goals.       Expected Outcomes Short Term: Attend rehab on a regular basis to  increase amount of physical activity.;Long Term: Add in home exercise to make exercise part of routine and to increase amount of physical activity.;Long Term: Exercising regularly at least 3-5 days a week.       Increase Strength and Stamina Yes       Intervention Provide advice, education, support and counseling about physical activity/exercise needs.;Develop an individualized exercise prescription for aerobic and resistive training based on initial evaluation findings, risk stratification, comorbidities and participant's personal goals.       Expected Outcomes Short Term: Increase workloads from initial exercise prescription for resistance, speed, and METs.;Short Term: Perform resistance training exercises routinely during rehab and add in resistance training at home;Long Term: Improve cardiorespiratory fitness, muscular endurance and strength as measured by increased METs and functional capacity ( )       Able to understand and use rate of perceived exertion (RPE) scale Yes       Intervention Provide education and explanation on how to use RPE scale       Expected Outcomes Short Term: Able to use RPE daily in rehab to express subjective intensity level;Long Term:  Able to use RPE to guide intensity level when exercising independently       Able to understand and use Dyspnea scale Yes       Intervention Provide education and explanation on how to use Dyspnea scale       Expected Outcomes Short Term: Able to use Dyspnea scale daily in rehab to express subjective sense of shortness of breath during exertion;Long Term: Able to use Dyspnea scale to guide intensity level when exercising independently       Knowledge and understanding of Target Heart Rate Range (THRR) Yes       Intervention Provide education and explanation of THRR including how the numbers were predicted and where they are located for reference       Expected Outcomes Short Term: Able to state/look up THRR;Long Term: Able to use THRR to  govern intensity when exercising independently;Short Term: Able to use daily as guideline for intensity in rehab       Able to check pulse independently Yes       Intervention Provide education and demonstration on how to check pulse in carotid and radial arteries.;Review the importance of being  able to check your own pulse for safety during independent exercise       Expected Outcomes Short Term: Able to explain why pulse checking is important during independent exercise;Long Term: Able to check pulse independently and accurately       Understanding of Exercise Prescription Yes       Intervention Provide education, explanation, and written materials on patient's individual exercise prescription       Expected Outcomes Short Term: Able to explain program exercise prescription;Long Term: Able to explain home exercise prescription to exercise independently                Exercise Goals Re-Evaluation :   Discharge Exercise Prescription (Final Exercise Prescription Changes):  Exercise Prescription Changes - 06/28/23 1500       Response to Exercise   Blood Pressure (Admit) 112/66    Blood Pressure (Exercise) 126/66    Blood Pressure (Exit) 110/66    Heart Rate (Admit) 73 bpm    Heart Rate (Exercise) 90 bpm    Heart Rate (Exit) 66 bpm    Oxygen Saturation (Admit) 94 %    Oxygen Saturation (Exercise) 91 %    Oxygen Saturation (Exit) 98 %    Rating of Perceived Exertion (Exercise) 13    Perceived Dyspnea (Exercise) 2    Symptoms bilateral hip pain    Comments results    Duration Progress to 30 minutes of  aerobic without signs/symptoms of physical distress    Intensity THRR New      Progression   Progression Continue to progress workloads to maintain intensity without signs/symptoms of physical distress.    Average METs 1.77             Nutrition:  Target Goals: Understanding of nutrition guidelines, daily intake of sodium 1500mg , cholesterol 200mg , calories 30% from  fat and 7% or less from saturated fats, daily to have 5 or more servings of fruits and vegetables.  Education: All About Nutrition: -Group instruction provided by verbal, written material, interactive activities, discussions, models, and posters to present general guidelines for heart healthy nutrition including fat, fiber, MyPlate, the role of sodium in heart healthy nutrition, utilization of the nutrition label, and utilization of this knowledge for meal planning. Follow up email sent as well. Written material given at graduation.   Biometrics:  Pre Biometrics - 06/28/23 1537       Pre Biometrics   Height 5' 2.1" (1.577 m)    Weight 182 lb 3.2 oz (82.6 kg)    Waist Circumference 43 inches    Hip Circumference 45.5 inches    Waist to Hip Ratio 0.95 %    BMI (Calculated) 33.23    Single Leg Stand 12 seconds              Nutrition Therapy Plan and Nutrition Goals:  Nutrition Therapy & Goals - 06/28/23 1602       Nutrition Therapy   Diet Cardiac, Low Na    Protein (specify units) 75    Fiber 25 grams    Whole Grain Foods 3 servings    Saturated Fats 15 max. grams    Fruits and Vegetables 5 servings/day    Sodium 2 grams      Personal Nutrition Goals   Nutrition Goal Drink ~3 bottles of water per day    Personal Goal #2 Eat a protein and carb at each meal    Personal Goal #3 Use small nutrient dense snacks when not very hungry  Comments Patient drinking 32oz of water daily, spoke about increasing to 48oz. She says sweets are her biggest struggle. Recommended some healthier sweet treat ideas to try. Encouraged her to drink some water before snacking. She reports she is trying to lose weight, doesn't eat a lot but still struggling to lose the weight. Spoke to her about nutrient and calorie density, encouraged her to eat smaller but more nutrient dense foods to help her meet her nutrient goals like protein, fiber, and calories. Reviewed mediterranean diet handout, education on  types of fats, sources, and how to read label. Built out several meals and snacks with foods she likes and will eat.      Intervention Plan   Intervention Prescribe, educate and counsel regarding individualized specific dietary modifications aiming towards targeted core components such as weight, hypertension, lipid management, diabetes, heart failure and other comorbidities.;Nutrition handout(s) given to patient.    Expected Outcomes Short Term Goal: Understand basic principles of dietary content, such as calories, fat, sodium, cholesterol and nutrients.;Short Term Goal: A plan has been developed with personal nutrition goals set during dietitian appointment.;Long Term Goal: Adherence to prescribed nutrition plan.             Nutrition Assessments:  MEDIFICTS Score Key: >=70 Need to make dietary changes  40-70 Heart Healthy Diet <= 40 Therapeutic Level Cholesterol Diet  Flowsheet Row Pulmonary Rehab from 06/28/2023 in [facility] Cardiac and Pulmonary Rehab  Picture Your Plate Total Score on Admission 72      Picture Your Plate Scores: <19 Unhealthy dietary pattern with much room for improvement. 41-50 Dietary pattern unlikely to meet recommendations for good health and room for improvement. 51-60 More healthful dietary pattern, with some room for improvement.  >60 Healthy dietary pattern, although there may be some specific behaviors that could be improved.   Nutrition Goals Re-Evaluation:   Nutrition Goals Discharge (Final Nutrition Goals Re-Evaluation):   Psychosocial: Target Goals: Acknowledge presence or absence of significant depression and/or stress, maximize coping skills, provide positive support system. Participant is able to verbalize types and ability to use techniques and skills needed for reducing stress and depression.   Education: Stress, Anxiety, and Depression - Group verbal and visual presentation to define topics covered.  Reviews how body is impacted by stress,  anxiety, and depression.  Also discusses healthy ways to reduce stress and to treat/manage anxiety and depression.  Written material given at graduation.   Education: Sleep Hygiene -Provides group verbal and written instruction about how sleep can affect your health.  Define sleep hygiene, discuss sleep cycles and impact of sleep habits. Review good sleep hygiene tips.    Initial Review & Psychosocial Screening:  Initial Psych Review & Screening - 06/21/23 1339       Initial Review   Current issues with Current Anxiety/Panic      Family Dynamics   Good Support System? Yes    Comments Her husband has some dementia and other health issues. She can call her daughters if she gets anxiety she has some medication for it.      Barriers   Psychosocial barriers to participate in program The patient should benefit from training in stress management and relaxation.      Screening Interventions   Interventions Encouraged to exercise;To provide support and resources with identified psychosocial needs;Provide feedback about the scores to participant    Expected Outcomes Short Term goal: Utilizing psychosocial counselor, staff and physician to assist with identification of specific Stressors or current issues interfering  with healing process. Setting desired goal for each stressor or current issue identified.;Long Term Goal: Stressors or current issues are controlled or eliminated.;Short Term goal: Identification and review with participant of any Quality of Life or Depression concerns found by scoring the questionnaire.;Long Term goal: The participant improves quality of Life and PHQ9 Scores as seen by post scores and/or verbalization of changes             Quality of Life Scores:  Scores of 19 and below usually indicate a poorer quality of life in these areas.  A difference of  2-3 points is a clinically meaningful difference.  A difference of 2-3 points in the total score of the Quality of Life  Index has been associated with significant improvement in overall quality of life, self-image, physical symptoms, and general health in studies assessing change in quality of life.  PHQ-9: Review Flowsheet       06/28/2023  Depression screen PHQ 2/9  Decreased Interest 0  Down, Depressed, Hopeless 0  PHQ - 2 Score 0  Altered sleeping 0  Tired, decreased energy 1  Change in appetite 1  Feeling bad or failure about yourself  1  Trouble concentrating 0  Moving slowly or fidgety/restless 0  Suicidal thoughts 0  PHQ-9 Score 3    Details           Interpretation of Total Score  Total Score Depression Severity:  1-4 = Minimal depression, 5-9 = Mild depression, 10-14 = Moderate depression, 15-19 = Moderately severe depression, 20-27 = Severe depression   Psychosocial Evaluation and Intervention:  Psychosocial Evaluation - 06/21/23 1342       Psychosocial Evaluation & Interventions   Interventions Encouraged to exercise with the program and follow exercise prescription;Relaxation education;Stress management education    Comments Her husband has some dementia and other health issues. She can call her daughters if she gets anxiety she has some medication for it.    Expected Outcomes Short: Start [facility] to help with mood. Long: Maintain a healthy mental state.    Continue Psychosocial Services  Follow up required by staff             Psychosocial Re-Evaluation:   Psychosocial Discharge (Final Psychosocial Re-Evaluation):   Education: Education Goals: Education classes will be provided on a weekly basis, covering required topics. Participant will state understanding/return demonstration of topics presented.  Learning Barriers/Preferences:  Learning Barriers/Preferences - 06/21/23 1338       Learning Barriers/Preferences   Learning Barriers Hearing    Learning Preferences None             General Pulmonary Education Topics:  Infection Prevention: -  Provides verbal and written material to individual with discussion of infection control including proper hand washing and proper equipment cleaning during exercise session. Flowsheet Row Pulmonary Rehab from 06/28/2023 in [facility] Cardiac and Pulmonary Rehab  Date 06/28/23  Educator MB  Instruction Review Code 1- Verbalizes Understanding       Falls Prevention: - Provides verbal and written material to individual with discussion of falls prevention and safety. Flowsheet Row Pulmonary Rehab from 06/28/2023 in [facility] Cardiac and Pulmonary Rehab  Date 06/28/23  Educator MB  Instruction Review Code 1- Verbalizes Understanding       Chronic Lung Disease Review: - Group verbal instruction with posters, models, PowerPoint presentations and videos,  to review new updates, new respiratory medications, new advancements in procedures and treatments. Providing information on websites and "800" numbers for continued self-education. Includes information  about supplement oxygen, available portable oxygen systems, continuous and intermittent flow rates, oxygen safety, concentrators, and Medicare reimbursement for oxygen. Explanation of Pulmonary Drugs, including class, frequency, complications, importance of spacers, rinsing mouth after steroid MDI's, and proper cleaning methods for nebulizers. Review of basic lung anatomy and physiology related to function, structure, and complications of lung disease. Review of risk factors. Discussion about methods for diagnosing sleep apnea and types of masks and machines for OSA. Includes a review of the use of types of environmental controls: home humidity, furnaces, filters, dust mite/pet prevention, HEPA vacuums. Discussion about weather changes, air quality and the benefits of nasal washing. Instruction on Warning signs, infection symptoms, calling MD promptly, preventive modes, and value of vaccinations. Review of effective airway clearance, coughing and/or vibration  techniques. Emphasizing that all should Create an Action Plan. Written material given at graduation. Flowsheet Row Pulmonary Rehab from 06/28/2023 in [facility] Cardiac and Pulmonary Rehab  Education need identified 06/28/23       AED/CPR: - Group verbal and written instruction with the use of models to demonstrate the basic use of the AED with the basic ABC's of resuscitation.    Anatomy and Cardiac Procedures: - Group verbal and visual presentation and models provide information about basic cardiac anatomy and function. Reviews the testing methods done to diagnose heart disease and the outcomes of the test results. Describes the treatment choices: Medical Management, Angioplasty, or Coronary Bypass Surgery for treating various heart conditions including Myocardial Infarction, Angina, Valve Disease, and Cardiac Arrhythmias.  Written material given at graduation.   Medication Safety: - Group verbal and visual instruction to review commonly prescribed medications for heart and lung disease. Reviews the medication, class of the drug, and side effects. Includes the steps to properly store meds and maintain the prescription regimen.  Written material given at graduation.   Other: -Provides group and verbal instruction on various topics (see comments)   Knowledge Questionnaire Score:  Knowledge Questionnaire Score - 06/28/23 1554       Knowledge Questionnaire Score   Pre Score 16/18              Core Components/Risk Factors/Patient Goals at Admission:  Personal Goals and Risk Factors at Admission - 06/28/23 1539       Core Components/Risk Factors/Patient Goals on Admission    Weight Management Weight Loss;Yes    Intervention Weight Management: Develop a combined nutrition and exercise program designed to reach desired caloric intake, while maintaining appropriate intake of nutrient and fiber, sodium and fats, and appropriate energy expenditure required for the weight goal.;Weight  Management: Provide education and appropriate resources to help participant work on and attain dietary goals.;Weight Management/Obesity: Establish reasonable short term and long term weight goals.    Admit Weight 182 lb 3.2 oz (82.6 kg)    Goal Weight: Short Term 172 lb 3.2 oz (78.1 kg)    Goal Weight: Long Term 162 lb 3.2 oz (73.6 kg)    Expected Outcomes Short Term: Continue to assess and modify interventions until short term weight is achieved;Long Term: Adherence to nutrition and physical activity/exercise program aimed toward attainment of established weight goal;Weight Loss: Understanding of general recommendations for a balanced deficit meal plan, which promotes 1-2 lb weight loss per week and includes a negative energy balance of [phone] kcal/d;Understanding recommendations for meals to include 15-35% energy as protein, 25-35% energy from fat, 35-60% energy from carbohydrates, less than 200mg  of dietary cholesterol, 20-35 gm of total fiber daily;Understanding of distribution of calorie intake  throughout the day with the consumption of 4-5 meals/snacks    Improve shortness of breath with ADL's Yes    Intervention Provide education, individualized exercise plan and daily activity instruction to help decrease symptoms of SOB with activities of daily living.    Expected Outcomes Long Term: Be able to perform more ADLs without symptoms or delay the onset of symptoms;Short Term: Improve cardiorespiratory fitness to achieve a reduction of symptoms when performing ADLs    Hypertension Yes    Intervention Provide education on lifestyle modifcations including regular physical activity/exercise, weight management, moderate sodium restriction and increased consumption of fresh fruit, vegetables, and low fat dairy, alcohol moderation, and smoking cessation.;Monitor prescription use compliance.    Expected Outcomes Short Term: Continued assessment and intervention until BP is < 140/32mm HG in hypertensive  participants. < 130/33mm HG in hypertensive participants with diabetes, heart failure or chronic kidney disease.;Long Term: Maintenance of blood pressure at goal levels.    Lipids Yes    Intervention Provide education and support for participant on nutrition & aerobic/resistive exercise along with prescribed medications to achieve LDL 70mg , HDL >40mg .    Expected Outcomes Short Term: Participant states understanding of desired cholesterol values and is compliant with medications prescribed. Participant is following exercise prescription and nutrition guidelines.;Long Term: Cholesterol controlled with medications as prescribed, with individualized exercise RX and with personalized nutrition plan. Value goals: LDL < 70mg , HDL > 40 mg.             Education:Diabetes - Individual verbal and written instruction to review signs/symptoms of diabetes, desired ranges of glucose level fasting, after meals and with exercise. Acknowledge that pre and post exercise glucose checks will be done for 3 sessions at entry of program.   Know Your Numbers and Heart Failure: - Group verbal and visual instruction to discuss disease risk factors for cardiac and pulmonary disease and treatment options.  Reviews associated critical values for Overweight/Obesity, Hypertension, Cholesterol, and Diabetes.  Discusses basics of heart failure: signs/symptoms and treatments.  Introduces Heart Failure Zone chart for action plan for heart failure.  Written material given at graduation.   Core Components/Risk Factors/Patient Goals Review:    Core Components/Risk Factors/Patient Goals at Discharge (Final Review):    ITP Comments:  ITP Comments     Row Name 06/21/23 1336 06/28/23 1556 07/05/23 0834       ITP Comments Virtual Visit completed. Patient informed on EP and RD appointment and 6 Minute walk test. Patient also informed of patient health questionnaires on My Chart. Patient Verbalizes understanding. Visit diagnosis  can be found in [facility] 06/13/2023. Completed and gym orientation. Initial ITP created and sent for review to Dr. Jinny Sanders, Medical Director. 30 Day review completed. Medical Director ITP review done, changes made as directed, and signed approval by Medical Director.    new to program              Comments:

## 2023-07-06 ENCOUNTER — Encounter: Payer: Medicare Other | Admitting: *Deleted

## 2023-07-06 DIAGNOSIS — D869 Sarcoidosis, unspecified: Secondary | ICD-10-CM

## 2023-07-06 DIAGNOSIS — D860 Sarcoidosis of lung: Secondary | ICD-10-CM | POA: Diagnosis not present

## 2023-07-06 NOTE — Progress Notes (Signed)
Daily Session Note  Patient Details  Name: Megan Daniels MRN: [id number] Date of Birth: [birth year] Referring Provider:   Flowsheet Row Pulmonary Rehab from 06/28/2023 in [facility] Cardiac and Pulmonary Rehab  Referring Provider Vida Rigger, MD       Encounter Date: 07/06/2023  Check In:  Session Check In - 07/06/23 0936       Check-In   Supervising physician immediately available to respond to emergencies See telemetry face sheet for immediately available ER MD    Location [facility]-Cardiac & Pulmonary Rehab    Staff Present Cora Collum, RN, BSN, CCRP;Joseph Hood, RCP,RRT,BSRT;Maxon Bell BS, , Exercise Physiologist;Donnel Venuto Katrinka Blazing, RN, [place]    Virtual Visit No    Medication changes reported     No    Fall or balance concerns reported    No    Warm-up and Cool-down Performed on first and last piece of equipment    Resistance Training Performed Yes    VAD Patient? No    PAD/SET Patient? No      Pain Assessment   Currently in Pain? No/denies                Social History   Tobacco Use  Smoking Status Never  Smokeless Tobacco Never    Goals Met:  Independence with exercise equipment Exercise tolerated well No report of concerns or symptoms today Strength training completed today  Goals Unmet:  Not Applicable  Comments: First full day of exercise!  Patient was oriented to gym and equipment including functions, settings, policies, and procedures.  Patient's individual exercise prescription and treatment plan were reviewed.  All starting workloads were established based on the results of the 6 minute walk test done at initial orientation visit.  The plan for exercise progression was also introduced and progression will be customized based on patient's performance and goals.    Dr. Bethann Punches is Medical Director for [facility] Cardiac Rehabilitation.  Dr. Vida Rigger is Medical Director for [facility] Pulmonary Rehabilitation.

## 2023-07-07 ENCOUNTER — Encounter: Payer: Medicare Other | Attending: Pulmonary Disease | Admitting: *Deleted

## 2023-07-07 DIAGNOSIS — D869 Sarcoidosis, unspecified: Secondary | ICD-10-CM | POA: Insufficient documentation

## 2023-07-07 DIAGNOSIS — R0600 Dyspnea, unspecified: Secondary | ICD-10-CM | POA: Insufficient documentation

## 2023-07-07 DIAGNOSIS — Z5189 Encounter for other specified aftercare: Secondary | ICD-10-CM | POA: Insufficient documentation

## 2023-07-07 NOTE — Progress Notes (Signed)
Daily Session Note  Patient Details  Name: Megan Daniels MRN: [id number] Date of Birth: [birth year] Referring Provider:   Flowsheet Row Pulmonary Rehab from 06/28/2023 in [facility] Cardiac and Pulmonary Rehab  Referring Provider Vida Rigger, MD       Encounter Date: 07/07/2023  Check In:  Session Check In - 07/07/23 0931       Check-In   Supervising physician immediately available to respond to emergencies See telemetry face sheet for immediately available ER MD    Location [facility]-Cardiac & Pulmonary Rehab    Staff Present Ronette Deter, BS, Exercise Physiologist;Joseph Shelbie Proctor, RN, [place]    Virtual Visit No    Medication changes reported     No    Fall or balance concerns reported    No    Warm-up and Cool-down Performed on first and last piece of equipment    Resistance Training Performed Yes    VAD Patient? No    PAD/SET Patient? No      Pain Assessment   Currently in Pain? No/denies                Social History   Tobacco Use  Smoking Status Never  Smokeless Tobacco Never    Goals Met:  Independence with exercise equipment Exercise tolerated well No report of concerns or symptoms today Strength training completed today  Goals Unmet:  Not Applicable  Comments: Pt able to follow exercise prescription today without complaint.  Will continue to monitor for progression.    Dr. Bethann Punches is Medical Director for [facility] Cardiac Rehabilitation.  Dr. Vida Rigger is Medical Director for [facility] Pulmonary Rehabilitation.

## 2023-07-11 ENCOUNTER — Encounter: Payer: Medicare Other | Admitting: *Deleted

## 2023-07-11 DIAGNOSIS — Z5189 Encounter for other specified aftercare: Secondary | ICD-10-CM | POA: Diagnosis not present

## 2023-07-11 DIAGNOSIS — D869 Sarcoidosis, unspecified: Secondary | ICD-10-CM

## 2023-07-11 NOTE — Progress Notes (Signed)
Daily Session Note  Patient Details  Name: Megan Daniels MRN: [id number] Date of Birth: Apr 23, 1951 Referring Provider:   Flowsheet Row Pulmonary Rehab from 06/28/2023 in [facility] Cardiac and Pulmonary Rehab  Referring Provider Vida Rigger, MD       Encounter Date: 07/11/2023  Check In:  Session Check In - 07/11/23 0929       Check-In   Supervising physician immediately available to respond to emergencies See telemetry face sheet for immediately available ER MD    Location [facility]-Cardiac & Pulmonary Rehab    Staff Present Rory Percy, MS, Exercise Physiologist;Ralphine Hinks, RN, BSN, CCRP;Noah Tickle, BS, Exercise Physiologist;Maxon Conetta BS, , Exercise Physiologist    Virtual Visit No    Medication changes reported     No    Fall or balance concerns reported    No    Warm-up and Cool-down Performed on first and last piece of equipment    Resistance Training Performed Yes    VAD Patient? No    PAD/SET Patient? No      Pain Assessment   Currently in Pain? No/denies                Social History   Tobacco Use  Smoking Status Never  Smokeless Tobacco Never    Goals Met:  Proper associated with RPD/PD & O2 Sat Independence with exercise equipment Exercise tolerated well No report of concerns or symptoms today  Goals Unmet:  Not Applicable  Comments: Pt able to follow exercise prescription today without complaint.  Will continue to monitor for progression.    Dr. Bethann Punches is Medical Director for [facility] Cardiac Rehabilitation.  Dr. Vida Rigger is Medical Director for [facility] Pulmonary Rehabilitation.

## 2023-07-18 DIAGNOSIS — Z5189 Encounter for other specified aftercare: Secondary | ICD-10-CM | POA: Diagnosis not present

## 2023-07-18 DIAGNOSIS — D869 Sarcoidosis, unspecified: Secondary | ICD-10-CM

## 2023-07-18 NOTE — Progress Notes (Signed)
Daily Session Note  Patient Details  Name: Megan Daniels MRN: [id number] Date of Birth: 12-13-1950 Referring Provider:   Flowsheet Row Pulmonary Rehab from 06/28/2023 in [facility] Cardiac and Pulmonary Rehab  Referring Provider Vida Rigger, MD       Encounter Date: 07/18/2023  Check In:  Session Check In - 07/18/23 0931       Check-In   Supervising physician immediately available to respond to emergencies See telemetry face sheet for immediately available ER MD    Location [facility]-Cardiac & Pulmonary Rehab    Staff Present Maxon Conetta BS, , Exercise Physiologist;Margaret Best, MS, Exercise Physiologist;Adiana Smelcer, RN, BSN;Noah Tickle, BS, Exercise Physiologist    Virtual Visit No    Medication changes reported     No    Fall or balance concerns reported    No    Warm-up and Cool-down Performed on first and last piece of equipment    Resistance Training Performed Yes    VAD Patient? No    PAD/SET Patient? No      Pain Assessment   Currently in Pain? No/denies                Social History   Tobacco Use  Smoking Status Never  Smokeless Tobacco Never    Goals Met:  Proper associated with RPD/PD & O2 Sat Independence with exercise equipment Using PLB without cueing & demonstrates good technique Exercise tolerated well No report of concerns or symptoms today Strength training completed today  Goals Unmet:  Not Applicable  Comments: Pt able to follow exercise prescription today without complaint.  Will continue to monitor for progression.   Dr. Bethann Punches is Medical Director for [facility] Cardiac Rehabilitation.  Dr. Vida Rigger is Medical Director for [facility] Pulmonary Rehabilitation.

## 2023-07-20 ENCOUNTER — Encounter: Payer: Medicare Other | Admitting: *Deleted

## 2023-07-20 DIAGNOSIS — D869 Sarcoidosis, unspecified: Secondary | ICD-10-CM

## 2023-07-20 DIAGNOSIS — Z5189 Encounter for other specified aftercare: Secondary | ICD-10-CM | POA: Diagnosis not present

## 2023-07-20 NOTE — Progress Notes (Signed)
Daily Session Note  Patient Details  Name: Megan Daniels MRN: [id number] Date of Birth: 1950/10/21 Referring Provider:   Flowsheet Row Pulmonary Rehab from 06/28/2023 in [facility] Cardiac and Pulmonary Rehab  Referring Provider Vida Rigger, MD       Encounter Date: 07/20/2023  Check In:  Session Check In - 07/20/23 0930       Check-In   Supervising physician immediately available to respond to emergencies See telemetry face sheet for immediately available ER MD    Location [facility]-Cardiac & Pulmonary Rehab    Staff Present Cora Collum, RN, BSN, CCRP;Margaret Best, MS, Exercise Physiologist;Mauro Arps Katrinka Blazing, RN, ADN    Virtual Visit No    Medication changes reported     No    Fall or balance concerns reported    No    Warm-up and Cool-down Performed on first and last piece of equipment    Resistance Training Performed Yes    VAD Patient? No    PAD/SET Patient? No      Pain Assessment   Currently in Pain? No/denies                Social History   Tobacco Use  Smoking Status Never  Smokeless Tobacco Never    Goals Met:  Independence with exercise equipment Exercise tolerated well No report of concerns or symptoms today Strength training completed today  Goals Unmet:  Not Applicable  Comments: Pt able to follow exercise prescription today without complaint.  Will continue to monitor for progression.    Dr. Bethann Punches is Medical Director for [facility] Cardiac Rehabilitation.  Dr. Vida Rigger is Medical Director for [facility] Pulmonary Rehabilitation.

## 2023-07-21 ENCOUNTER — Encounter: Payer: Medicare Other | Admitting: *Deleted

## 2023-07-21 DIAGNOSIS — D869 Sarcoidosis, unspecified: Secondary | ICD-10-CM

## 2023-07-21 DIAGNOSIS — Z5189 Encounter for other specified aftercare: Secondary | ICD-10-CM | POA: Diagnosis not present

## 2023-07-21 NOTE — Progress Notes (Signed)
Daily Session Note  Patient Details  Name: Megan Daniels MRN: [id number] Date of Birth: 1950-12-31 Referring Provider:   Flowsheet Row Pulmonary Rehab from 06/28/2023 in [facility] Cardiac and Pulmonary Rehab  Referring Provider Vida Rigger, MD       Encounter Date: 07/21/2023  Check In:  Session Check In - 07/21/23 0947       Check-In   Supervising physician immediately available to respond to emergencies See telemetry face sheet for immediately available ER MD    Location [facility]-Cardiac & Pulmonary Rehab    Staff Present Cora Collum, RN, BSN, CCRP;Noah Tickle, BS, Exercise Physiologist;Joseph Everman, [place]    Virtual Visit No    Medication changes reported     No    Fall or balance concerns reported    No    Warm-up and Cool-down Performed on first and last piece of equipment    Resistance Training Performed Yes    VAD Patient? No    PAD/SET Patient? No      Pain Assessment   Currently in Pain? No/denies                Social History   Tobacco Use  Smoking Status Never  Smokeless Tobacco Never    Goals Met:  Proper associated with RPD/PD & O2 Sat Independence with exercise equipment Exercise tolerated well No report of concerns or symptoms today  Goals Unmet:  Not Applicable  Comments: Pt able to follow exercise prescription today without complaint.  Will continue to monitor for progression.    Dr. Bethann Punches is Medical Director for [facility] Cardiac Rehabilitation.  Dr. Vida Rigger is Medical Director for [facility] Pulmonary Rehabilitation.

## 2023-07-25 ENCOUNTER — Encounter: Payer: Medicare Other | Admitting: *Deleted

## 2023-07-25 DIAGNOSIS — D869 Sarcoidosis, unspecified: Secondary | ICD-10-CM

## 2023-07-25 DIAGNOSIS — Z5189 Encounter for other specified aftercare: Secondary | ICD-10-CM | POA: Diagnosis not present

## 2023-07-25 NOTE — Progress Notes (Signed)
Daily Session Note  Patient Details  Name: Megan Daniels MRN: [id number] Date of Birth: [birth year] Referring Provider:   Flowsheet Row Pulmonary Rehab from 06/28/2023 in [facility] Cardiac and Pulmonary Rehab  Referring Provider Vida Rigger, MD       Encounter Date: 07/25/2023  Check In:  Session Check In - 07/25/23 0944       Check-In   Supervising physician immediately available to respond to emergencies See telemetry face sheet for immediately available ER MD    Location [facility]-Cardiac & Pulmonary Rehab    Staff Present Cora Collum, RN, BSN, CCRP;Noah Tickle, BS, Exercise Physiologist;Margaret Best, MS, Exercise Physiologist;Maxon Conetta BS, , Exercise Physiologist    Virtual Visit No    Medication changes reported     No    Fall or balance concerns reported    No    Warm-up and Cool-down Performed on first and last piece of equipment    Resistance Training Performed No    VAD Patient? No    PAD/SET Patient? No      Pain Assessment   Currently in Pain? No/denies                Social History   Tobacco Use  Smoking Status Never  Smokeless Tobacco Never    Goals Met:  Proper associated with RPD/PD & O2 Sat Independence with exercise equipment Exercise tolerated well No report of concerns or symptoms today  Goals Unmet:  Not Applicable  Comments: Pt able to follow exercise prescription today without complaint.  Will continue to monitor for progression.    Dr. Bethann Punches is Medical Director for [facility] Cardiac Rehabilitation.  Dr. Vida Rigger is Medical Director for [facility] Pulmonary Rehabilitation.

## 2023-07-26 ENCOUNTER — Encounter: Payer: Self-pay | Admitting: *Deleted

## 2023-07-26 DIAGNOSIS — D869 Sarcoidosis, unspecified: Secondary | ICD-10-CM

## 2023-07-26 NOTE — Progress Notes (Signed)
Pulmonary Individual Treatment Plan  Patient Details  Name: Megan Daniels MRN: [id number] Date of Birth: 01-Jan-1951 Referring Provider:   Flowsheet Row Pulmonary Rehab from 06/28/2023 in [facility] Cardiac and Pulmonary Rehab  Referring Provider Vida Rigger, MD       Initial Encounter Date:  Flowsheet Row Pulmonary Rehab from 06/28/2023 in [facility] Cardiac and Pulmonary Rehab  Date 06/28/23       Visit Diagnosis: Sarcoidosis  Patient's Home Medications on Admission:  Current Outpatient Medications:    acetaminophen (TYLENOL) 500 MG tablet, Take 1,000 mg by mouth every 6 (six) hours as needed for moderate pain or headache., Disp: , Rfl:    albuterol (VENTOLIN HFA) 108 (90 Base) MCG/ACT inhaler, Inhale 2 puffs into the lungs every 4 (four) hours as needed for wheezing or shortness of breath.  (Patient not taking: Reported on 11/25/2022), Disp: , Rfl:    albuterol (VENTOLIN HFA) 108 (90 Base) MCG/ACT inhaler, Inhale into the lungs., Disp: , Rfl:    amLODipine (NORVASC) 5 MG tablet, Take 5 mg by mouth daily., Disp: , Rfl:    aspirin EC 81 MG tablet, Take 1 tablet (81 mg total) by mouth daily. (Patient taking differently: Take 81 mg by mouth every evening.), Disp: , Rfl:    Black Elderberry (SAMBUCUS ELDERBERRY) 50 MG/5ML SYRP, Take by mouth., Disp: , Rfl:    busPIRone (BUSPAR) 10 MG tablet, Take 10 mg by mouth 2 (two) times daily. (Patient not taking: Reported on 06/21/2023), Disp: , Rfl:    carvedilol (COREG) 12.5 MG tablet, Take 12.5 mg by mouth., Disp: , Rfl:    carvedilol (COREG) 12.5 MG tablet, Take by mouth., Disp: , Rfl:    cephALEXin (KEFLEX) 500 MG capsule, Take 500 mg by mouth 3 (three) times daily. (Patient not taking: Reported on 06/21/2023), Disp: , Rfl:    cetirizine (ZYRTEC) 10 MG chewable tablet, Chew by mouth., Disp: , Rfl:    cetirizine (ZYRTEC) 10 MG tablet, Take by mouth. (Patient not taking: Reported on 06/21/2023), Disp: , Rfl:    Cholecalciferol (VITAMIN D) 2000  units CAPS, Take 2,000 Units by mouth daily. , Disp: , Rfl:    Cholecalciferol (VITAMIN D3) 50 MCG (2000 UT) capsule, Take by mouth., Disp: , Rfl:    Collagen Matrix, Porcine, 2 X 2 X 2 CM MISC, Use 1,000 mg once daily, Disp: , Rfl:    cyanocobalamin (VITAMIN B12) 500 MCG tablet, Take by mouth., Disp: , Rfl:    docusate sodium (COLACE) 100 MG capsule, Take 100 mg by mouth 2 (two) times daily. , Disp: , Rfl:    DULoxetine (CYMBALTA) 30 MG capsule, Take by mouth., Disp: , Rfl:    famotidine (PEPCID) 40 MG tablet, Take by mouth., Disp: , Rfl:    fluticasone (FLONASE) 50 MCG/ACT nasal spray, Place into the nose., Disp: , Rfl:    folic acid (FOLVITE) 1 MG tablet, Take by mouth., Disp: , Rfl:    gabapentin (NEURONTIN) 300 MG capsule, Take by mouth. (Patient not taking: Reported on 06/21/2023), Disp: , Rfl:    gabapentin (NEURONTIN) 300 MG capsule, Take by mouth., Disp: , Rfl:    irbesartan (AVAPRO) 300 MG tablet, TAKE 1 TABLET BY MOUTH DAILY, Disp: , Rfl:    irbesartan (AVAPRO) 300 MG tablet, Take by mouth. (Patient not taking: Reported on 06/21/2023), Disp: , Rfl:    losartan (COZAAR) 100 MG tablet, Take 100 mg by mouth at bedtime.  (Patient not taking: Reported on 11/25/2022), Disp: , Rfl:  magnesium oxide (MAG-OX) 400 MG tablet, Take by mouth., Disp: , Rfl:    metaxalone (SKELAXIN) 800 MG tablet, Take by mouth., Disp: , Rfl:    metoprolol succinate (TOPROL-XL) 25 MG 24 hr tablet, Take 25 mg by mouth 2 (two) times daily.  (Patient not taking: Reported on 11/25/2022), Disp: , Rfl:    Multiple Vitamin (MULTIVITAMIN WITH MINERALS) TABS tablet, Take 1 tablet by mouth daily., Disp: , Rfl:    omeprazole (PRILOSEC) 40 MG capsule, TAKE 1 CAPSULE DAILY BEFORE BREAKFAST, Disp: 90 capsule, Rfl: 3   omeprazole (PRILOSEC) 40 MG capsule, Take by mouth. (Patient not taking: Reported on 06/21/2023), Disp: , Rfl:    predniSONE (DELTASONE) 2.5 MG tablet, Take by mouth. (Patient not taking: Reported on 06/21/2023),  Disp: , Rfl:    sulfamethoxazole-trimethoprim (BACTRIM) 400-80 MG tablet, Take 1 tablet by mouth 3 (three) times a week. (Patient not taking: Reported on 06/21/2023), Disp: , Rfl:    topiramate (TOPAMAX) 50 MG tablet, Take 50 mg by mouth 2 (two) times daily., Disp: , Rfl:    vitamin B-12 (CYANOCOBALAMIN) 500 MCG tablet, Take 500 mcg by mouth daily. , Disp: , Rfl:    vitamin C (ASCORBIC ACID) 500 MG tablet, Take 500 mg by mouth daily., Disp: , Rfl:    Vitamins/Minerals TABS, Take by mouth., Disp: , Rfl:   Past Medical History: Past Medical History:  Diagnosis Date   Abnormal Pap smear of cervix    pos hpv-    Acid reflux    Anxiety    Arthritis    Asthma    Symbicort as needed   Cancer (HCC)    Basal Cell Skin Cancer   Chalazion of left eye    Chronic back pain    spondylolisthesis/stenosis   Chronic UTI    was treated for 3 months with Macrobid and in Dec everything was fine   Constipation    COPD (chronic obstructive pulmonary disease) (HCC)    Diverticulosis    Elevated serum creatinine    with nsaids   Foot drop    right   Genital warts    h/o   GERD (gastroesophageal reflux disease)    takes Prevacid daily   Headache    History of bronchitis    History of colon polyps    History of hiatal hernia    Hx of vertigo    Hypertension    takes Losartan daily   IBS (irritable bowel syndrome)    Joint pain    Palpitations    takes Metoprolol daily   Pneumonia    hx of-early 2000's   PONV (postoperative nausea and vomiting)    extreme nausea and vomiting -none last surgery 10/29/14   Sarcoidosis    Seasonal allergies    takes Claritin daily   Shortness of breath dyspnea    Sleep apnea    cpap 10 yrs   Stress incontinence    Thyroid nodule    URI (upper respiratory infection)    treated self with Mucinex and no fever   Urinary urgency    Vitamin D deficiency    Weakness    numbness and tingling in both legs    Tobacco Use: Social History   Tobacco Use   Smoking Status Never  Smokeless Tobacco Never    Labs: Review Flowsheet        No data to display           Pulmonary Assessment Scores:  Pulmonary Assessment Scores  Row Name 06/28/23 1537         ADL UCSD   SOB Score total 30     Rest 0     Walk 0     Stairs 1     Bath 1     Dress 1     Shop 1       CAT Score   CAT Score 16       mMRC Score   mMRC Score 1              UCSD: Self-administered rating of dyspnea associated with activities of daily living (ADLs) 6-point scale (0 = "not at all" to 5 = "maximal or unable to do because of breathlessness")  Scoring Scores range from 0 to 120.  Minimally important difference is 5 units  CAT: CAT can identify the health impairment of COPD patients and is better correlated with disease progression.  CAT has a scoring range of zero to 40. The CAT score is classified into four groups of low (less than 10), medium (10 - 20), high (21-30) and very high (31-40) based on the impact level of disease on health status. A CAT score over 10 suggests significant symptoms.  A worsening CAT score could be explained by an exacerbation, poor medication adherence, poor inhaler technique, or progression of COPD or comorbid conditions.  CAT MCID is 2 points  mMRC: mMRC (Modified Medical Research Council) Dyspnea Scale is used to assess the degree of baseline functional disability in patients of respiratory disease due to dyspnea. No minimal important difference is established. A decrease in score of 1 point or greater is considered a positive change.   Pulmonary Function Assessment:  Pulmonary Function Assessment - 06/21/23 1338       Breath   Shortness of Breath Yes;Limiting activity             Exercise Target Goals: Exercise Program Goal: Individual exercise prescription set using results from initial 6 min walk test and THRR while considering  patient's activity barriers and safety.   Exercise Prescription  Goal: Initial exercise prescription builds to 30-45 minutes a day of aerobic activity, 2-3 days per week.  Home exercise guidelines will be given to patient during program as part of exercise prescription that the participant will acknowledge.  Education: Aerobic Exercise: - Group verbal and visual presentation on the components of exercise prescription. Introduces F.I.T.T principle from ACSM for exercise prescriptions.  Reviews F.I.T.T. principles of aerobic exercise including progression. Written material given at graduation.   Education: Resistance Exercise: - Group verbal and visual presentation on the components of exercise prescription. Introduces F.I.T.T principle from ACSM for exercise prescriptions  Reviews F.I.T.T. principles of resistance exercise including progression. Written material given at graduation.    Education: Exercise & Equipment Safety: - Individual verbal instruction and demonstration of equipment use and safety with use of the equipment. Flowsheet Row Pulmonary Rehab from 06/28/2023 in [facility] Cardiac and Pulmonary Rehab  Date 06/28/23  Educator MB  Instruction Review Code 1- Verbalizes Understanding       Education: Exercise Physiology & General Exercise Guidelines: - Group verbal and written instruction with models to review the exercise physiology of the cardiovascular system and associated critical values. Provides general exercise guidelines with specific guidelines to those with heart or lung disease.    Education: Flexibility, Balance, Mind/Body Relaxation: - Group verbal and visual presentation with interactive activity on the components of exercise prescription. Introduces F.I.T.T principle from ACSM for exercise prescriptions.  Reviews F.I.T.T. principles of flexibility and balance exercise training including progression. Also discusses the mind body connection.  Reviews various relaxation techniques to help reduce and manage stress (i.e. Deep breathing,  progressive muscle relaxation, and visualization). Balance handout provided to take home. Written material given at graduation.   Activity Barriers & Risk Stratification:  Activity Barriers & Cardiac Risk Stratification - 06/28/23 1528       Activity Barriers & Cardiac Risk Stratification   Activity Barriers Back Problems;Shortness of Breath             6 Minute Walk:  6 Minute Walk     Row Name 06/28/23 1525         6 Minute Walk   Phase Initial     Distance 1040 feet     Walk Time 6 minutes     # of Rest Breaks 0     MPH 1.97     METS 1.77     RPE 13     Perceived Dyspnea  2     VO2 Peak 6.19     Symptoms Yes (comment)     Comments Bilateral hip pain     Resting HR 73 bpm     Resting BP 112/66     Resting Oxygen Saturation  94 %     Exercise Oxygen Saturation  during 6 min walk 91 %     Max Ex. HR 90 bpm     Max Ex. BP 126/66     2 Minute Post BP 110/66       Interval HR   1 Minute HR 67     2 Minute HR 89     3 Minute HR 90     4 Minute HR 90     5 Minute HR 90     6 Minute HR 89     2 Minute Post HR 66     Interval Heart Rate? Yes       Interval Oxygen   Interval Oxygen? Yes     Baseline Oxygen Saturation % 94 %     1 Minute Oxygen Saturation % 93 %     1 Minute Liters of Oxygen 0 L     2 Minute Oxygen Saturation % 91 %     2 Minute Liters of Oxygen 0 L     3 Minute Oxygen Saturation % 92 %     3 Minute Liters of Oxygen 0 L     4 Minute Oxygen Saturation % 92 %     4 Minute Liters of Oxygen 0 L     5 Minute Oxygen Saturation % 93 %     5 Minute Liters of Oxygen 0 L     6 Minute Oxygen Saturation % 95 %     6 Minute Liters of Oxygen 0 L     2 Minute Post Oxygen Saturation % 98 %     2 Minute Post Liters of Oxygen 0 L             Oxygen Initial Assessment:  Oxygen Initial Assessment - 06/21/23 1337       Home Oxygen   Home Oxygen Device None    Sleep Oxygen Prescription CPAP    Liters per minute 0    Home Exercise Oxygen  Prescription None    Home Resting Oxygen Prescription None    Compliance with Home Oxygen Use Yes      Initial 6 min Walk  Oxygen Used None      Program Oxygen Prescription   Program Oxygen Prescription None      Intervention   Short Term Goals To learn and exhibit compliance with exercise, home and travel O2 prescription;To learn and understand importance of monitoring SPO2 with pulse oximeter and demonstrate accurate use of the pulse oximeter.;To learn and understand importance of maintaining oxygen saturations>88%;To learn and demonstrate proper pursed lip breathing techniques or other breathing techniques. ;To learn and demonstrate proper use of respiratory medications    Long  Term Goals Exhibits compliance with exercise, home  and travel O2 prescription;Verbalizes importance of monitoring SPO2 with pulse oximeter and return demonstration;Maintenance of O2 saturations>88%;Exhibits proper breathing techniques, such as pursed lip breathing or other method taught during program session;Compliance with respiratory medication;Demonstrates proper use of MDI's             Oxygen Re-Evaluation:  Oxygen Re-Evaluation     Row Name 07/06/23 0942 07/25/23 0925           Program Oxygen Prescription   Program Oxygen Prescription -- None        Home Oxygen   Home Oxygen Device -- None      Sleep Oxygen Prescription -- CPAP      Liters per minute -- 0      Home Exercise Oxygen Prescription -- None      Home Resting Oxygen Prescription -- None      Compliance with Home Oxygen Use -- Yes        Goals/Expected Outcomes   Short Term Goals -- To learn and exhibit compliance with exercise, home and travel O2 prescription;To learn and understand importance of monitoring SPO2 with pulse oximeter and demonstrate accurate use of the pulse oximeter.;To learn and understand importance of maintaining oxygen saturations>88%;To learn and demonstrate proper pursed lip breathing techniques or other  breathing techniques. ;To learn and demonstrate proper use of respiratory medications      Long  Term Goals -- Exhibits compliance with exercise, home  and travel O2 prescription;Verbalizes importance of monitoring SPO2 with pulse oximeter and return demonstration;Maintenance of O2 saturations>88%;Exhibits proper breathing techniques, such as pursed lip breathing or other method taught during program session;Compliance with respiratory medication;Demonstrates proper use of MDI's      Comments Reviewed PLB technique with pt.  Talked about how it works and it's importance in maintaining their exercise saturations. Reviewed PLB technique with her. SHowed her how it works and when to use it.      Goals/Expected Outcomes Short: Become more profiecient at using PLB. Long: Become independent at using PLB. Short: practive using PLB when short of breath and looking to breath easier. Long: become independed at using PLB               Oxygen Discharge (Final Oxygen Re-Evaluation):  Oxygen Re-Evaluation - 07/25/23 0925       Program Oxygen Prescription   Program Oxygen Prescription None      Home Oxygen   Home Oxygen Device None    Sleep Oxygen Prescription CPAP    Liters per minute 0    Home Exercise Oxygen Prescription None    Home Resting Oxygen Prescription None    Compliance with Home Oxygen Use Yes      Goals/Expected Outcomes   Short Term Goals To learn and exhibit compliance with exercise, home and travel O2 prescription;To learn and understand importance of monitoring SPO2 with pulse oximeter and demonstrate accurate use of the pulse oximeter.;To learn  and understand importance of maintaining oxygen saturations>88%;To learn and demonstrate proper pursed lip breathing techniques or other breathing techniques. ;To learn and demonstrate proper use of respiratory medications    Long  Term Goals Exhibits compliance with exercise, home  and travel O2 prescription;Verbalizes importance of  monitoring SPO2 with pulse oximeter and return demonstration;Maintenance of O2 saturations>88%;Exhibits proper breathing techniques, such as pursed lip breathing or other method taught during program session;Compliance with respiratory medication;Demonstrates proper use of MDI's    Comments Reviewed PLB technique with her. SHowed her how it works and when to use it.    Goals/Expected Outcomes Short: practive using PLB when short of breath and looking to breath easier. Long: become independed at using PLB             Initial Exercise Prescription:  Initial Exercise Prescription - 06/28/23 1500       Date of Initial Exercise RX and Referring Provider   Date 06/28/23    Referring Provider Vida Rigger, MD      Oxygen   Maintain Oxygen Saturation 88% or higher      Recumbant Bike   Level 1    RPM 50    Watts 15    Minutes 15    METs 1.77      NuStep   Level 1    SPM 80    Minutes 15    METs 1.77      Arm Ergometer   Level 1    RPM 30    Minutes 15    METs 1.77      Biostep-RELP   Level 1    SPM 50    Minutes 15    METs 1.77      Track   Laps 14    Minutes 15    METs 1.76      Prescription Details   Frequency (times per week) 3    Duration Progress to 30 minutes of continuous aerobic without signs/symptoms of physical distress      Intensity   THRR 40-80% of Max Heartrate 102-132    Ratings of Perceived Exertion 11-13    Perceived Dyspnea 0-4      Progression   Progression Continue to progress workloads to maintain intensity without signs/symptoms of physical distress.      Resistance Training   Training Prescription Yes    Weight 2 lb    Reps 10-15             Perform Capillary Blood Glucose checks as needed.  Exercise Prescription Changes:   Exercise Prescription Changes     Row Name 06/28/23 1500 07/20/23 1400           Response to Exercise   Blood Pressure (Admit) 112/66 124/76      Blood Pressure (Exercise) 126/66 124/70       Blood Pressure (Exit) 110/66 118/68      Heart Rate (Admit) 73 bpm 64 bpm      Heart Rate (Exercise) 90 bpm 88 bpm      Heart Rate (Exit) 66 bpm 65 bpm      Oxygen Saturation (Admit) 94 % 95 %      Oxygen Saturation (Exercise) 91 % 91 %      Oxygen Saturation (Exit) 98 % 95 %      Rating of Perceived Exertion (Exercise) 13 13      Perceived Dyspnea (Exercise) 2 2      Symptoms bilateral hip pain none  Comments results First three days of exercise      Duration Progress to 30 minutes of  aerobic without signs/symptoms of physical distress Progress to 30 minutes of  aerobic without signs/symptoms of physical distress      Intensity THRR New THRR unchanged        Progression   Progression Continue to progress workloads to maintain intensity without signs/symptoms of physical distress. Continue to progress workloads to maintain intensity without signs/symptoms of physical distress.      Average METs 1.77 2.33        Resistance Training   Training Prescription -- Yes      Weight -- 3 lb      Reps -- 10-15        Interval Training   Interval Training -- No        Recumbant Bike   Level -- 1      Watts -- 25      Minutes -- 15      METs -- 2.95        NuStep   Level -- 2      Minutes -- 30      METs -- 2.2        Track   Laps -- 25      Minutes -- 15      METs -- 2.36        Oxygen   Maintain Oxygen Saturation -- 88% or higher               Exercise Comments:   Exercise Comments     Row Name 07/06/23 [phone]           Exercise Comments First full day of exercise!  Patient was oriented to gym and equipment including functions, settings, policies, and procedures.  Patient's individual exercise prescription and treatment plan were reviewed.  All starting workloads were established based on the results of the 6 minute walk test done at initial orientation visit.  The plan for exercise progression was also introduced and progression will be customized based on  patient's performance and goals.                Exercise Goals and Review:   Exercise Goals     Row Name 06/28/23 1536             Exercise Goals   Increase Physical Activity Yes       Intervention Provide advice, education, support and counseling about physical activity/exercise needs.;Develop an individualized exercise prescription for aerobic and resistive training based on initial evaluation findings, risk stratification, comorbidities and participant's personal goals.       Expected Outcomes Short Term: Attend rehab on a regular basis to increase amount of physical activity.;Long Term: Add in home exercise to make exercise part of routine and to increase amount of physical activity.;Long Term: Exercising regularly at least 3-5 days a week.       Increase Strength and Stamina Yes       Intervention Provide advice, education, support and counseling about physical activity/exercise needs.;Develop an individualized exercise prescription for aerobic and resistive training based on initial evaluation findings, risk stratification, comorbidities and participant's personal goals.       Expected Outcomes Short Term: Increase workloads from initial exercise prescription for resistance, speed, and METs.;Short Term: Perform resistance training exercises routinely during rehab and add in resistance training at home;Long Term: Improve cardiorespiratory fitness, muscular endurance and strength as measured by increased METs and  functional capacity ( )       Able to understand and use rate of perceived exertion (RPE) scale Yes       Intervention Provide education and explanation on how to use RPE scale       Expected Outcomes Short Term: Able to use RPE daily in rehab to express subjective intensity level;Long Term:  Able to use RPE to guide intensity level when exercising independently       Able to understand and use Dyspnea scale Yes       Intervention Provide education and explanation on how  to use Dyspnea scale       Expected Outcomes Short Term: Able to use Dyspnea scale daily in rehab to express subjective sense of shortness of breath during exertion;Long Term: Able to use Dyspnea scale to guide intensity level when exercising independently       Knowledge and understanding of Target Heart Rate Range (THRR) Yes       Intervention Provide education and explanation of THRR including how the numbers were predicted and where they are located for reference       Expected Outcomes Short Term: Able to state/look up THRR;Long Term: Able to use THRR to govern intensity when exercising independently;Short Term: Able to use daily as guideline for intensity in rehab       Able to check pulse independently Yes       Intervention Provide education and demonstration on how to check pulse in carotid and radial arteries.;Review the importance of being able to check your own pulse for safety during independent exercise       Expected Outcomes Short Term: Able to explain why pulse checking is important during independent exercise;Long Term: Able to check pulse independently and accurately       Understanding of Exercise Prescription Yes       Intervention Provide education, explanation, and written materials on patient's individual exercise prescription       Expected Outcomes Short Term: Able to explain program exercise prescription;Long Term: Able to explain home exercise prescription to exercise independently                Exercise Goals Re-Evaluation :  Exercise Goals Re-Evaluation     Row Name 07/06/23 2725 07/20/23 1500 07/25/23 0919         Exercise Goal Re-Evaluation   Exercise Goals Review Able to understand and use rate of perceived exertion (RPE) scale;Able to understand and use Dyspnea scale;Knowledge and understanding of Target Heart Rate Range (THRR);Understanding of Exercise Prescription Increase Physical Activity;Increase Strength and Stamina;Understanding of Exercise  Prescription Increase Physical Activity;Increase Strength and Stamina;Understanding of Exercise Prescription     Comments Reviewed RPE and dyspnea scale, THR and program prescription with pt today.  Pt voiced understanding and was given a copy of goals to take home. Megan Daniels is off to a good start in the program. She was able to walk up to 25 laps on the track and work at level 1 on the recumbent bike. She also improved to level 2 on the T4 nustep and increased to 3 lb hand weights for resistance training. We will continue to monitor her progress in the program. She is doing well with exercise at rehab, currently on level 2 with the T4. was able to get 30laps walking the track. Is still doing well with 3lbs with hand weight. She is not exercising much at home, walking to the mailbox and back, ~1/4 mile daily. Encouraged her to walk and  look for exercises she can do at home, to speak with exercise team here at rehab if she has questions.     Expected Outcomes Short: Use RPE daily to regulate intensity. Long: Follow program prescription in THR. Short: Continue to follow current exercise prescription. Long: Continue exercise to improve strength and stamina. Short: Continue to follow current exercise at rehab and at home. Long: Continue exercise and improve strength and stamina              Discharge Exercise Prescription (Final Exercise Prescription Changes):  Exercise Prescription Changes - 07/20/23 1400       Response to Exercise   Blood Pressure (Admit) 124/76    Blood Pressure (Exercise) 124/70    Blood Pressure (Exit) 118/68    Heart Rate (Admit) 64 bpm    Heart Rate (Exercise) 88 bpm    Heart Rate (Exit) 65 bpm    Oxygen Saturation (Admit) 95 %    Oxygen Saturation (Exercise) 91 %    Oxygen Saturation (Exit) 95 %    Rating of Perceived Exertion (Exercise) 13    Perceived Dyspnea (Exercise) 2    Symptoms none    Comments First three days of exercise    Duration Progress to 30 minutes of   aerobic without signs/symptoms of physical distress    Intensity THRR unchanged      Progression   Progression Continue to progress workloads to maintain intensity without signs/symptoms of physical distress.    Average METs 2.33      Resistance Training   Training Prescription Yes    Weight 3 lb    Reps 10-15      Interval Training   Interval Training No      Recumbant Bike   Level 1    Watts 25    Minutes 15    METs 2.95      NuStep   Level 2    Minutes 30    METs 2.2      Track   Laps 25    Minutes 15    METs 2.36      Oxygen   Maintain Oxygen Saturation 88% or higher             Nutrition:  Target Goals: Understanding of nutrition guidelines, daily intake of sodium 1500mg , cholesterol 200mg , calories 30% from fat and 7% or less from saturated fats, daily to have 5 or more servings of fruits and vegetables.  Education: All About Nutrition: -Group instruction provided by verbal, written material, interactive activities, discussions, models, and posters to present general guidelines for heart healthy nutrition including fat, fiber, MyPlate, the role of sodium in heart healthy nutrition, utilization of the nutrition label, and utilization of this knowledge for meal planning. Follow up email sent as well. Written material given at graduation.   Biometrics:  Pre Biometrics - 06/28/23 1537       Pre Biometrics   Height 5' 2.1" (1.577 m)    Weight 182 lb 3.2 oz (82.6 kg)    Waist Circumference 43 inches    Hip Circumference 45.5 inches    Waist to Hip Ratio 0.95 %    BMI (Calculated) 33.23    Single Leg Stand 12 seconds              Nutrition Therapy Plan and Nutrition Goals:  Nutrition Therapy & Goals - 06/28/23 1602       Nutrition Therapy   Diet Cardiac, Low Na    Protein (specify  units) 75    Fiber 25 grams    Whole Grain Foods 3 servings    Saturated Fats 15 max. grams    Fruits and Vegetables 5 servings/day    Sodium 2 grams       Personal Nutrition Goals   Nutrition Goal Drink ~3 bottles of water per day    Personal Goal #2 Eat a protein and carb at each meal    Personal Goal #3 Use small nutrient dense snacks when not very hungry    Comments Patient drinking 32oz of water daily, spoke about increasing to 48oz. She says sweets are her biggest struggle. Recommended some healthier sweet treat ideas to try. Encouraged her to drink some water before snacking. She reports she is trying to lose weight, doesn't eat a lot but still struggling to lose the weight. Spoke to her about nutrient and calorie density, encouraged her to eat smaller but more nutrient dense foods to help her meet her nutrient goals like protein, fiber, and calories. Reviewed mediterranean diet handout, education on types of fats, sources, and how to read label. Built out several meals and snacks with foods she likes and will eat.      Intervention Plan   Intervention Prescribe, educate and counsel regarding individualized specific dietary modifications aiming towards targeted core components such as weight, hypertension, lipid management, diabetes, heart failure and other comorbidities.;Nutrition handout(s) given to patient.    Expected Outcomes Short Term Goal: Understand basic principles of dietary content, such as calories, fat, sodium, cholesterol and nutrients.;Short Term Goal: A plan has been developed with personal nutrition goals set during dietitian appointment.;Long Term Goal: Adherence to prescribed nutrition plan.             Nutrition Assessments:  MEDIFICTS Score Key: >=70 Need to make dietary changes  40-70 Heart Healthy Diet <= 40 Therapeutic Level Cholesterol Diet  Flowsheet Row Pulmonary Rehab from 06/28/2023 in [facility] Cardiac and Pulmonary Rehab  Picture Your Plate Total Score on Admission 72      Picture Your Plate Scores: <32 Unhealthy dietary pattern with much room for improvement. 41-50 Dietary pattern unlikely to meet  recommendations for good health and room for improvement. 51-60 More healthful dietary pattern, with some room for improvement.  >60 Healthy dietary pattern, although there may be some specific behaviors that could be improved.   Nutrition Goals Re-Evaluation:  Nutrition Goals Re-Evaluation     Row Name 07/25/23 0933             Goals   Nutrition Goal Drink 3 bottles of water per day       Comment She isnt drinking as much water as she should be. Spoke with her baout the barriers to this goals, concluded together that she is likely forgetting. Suggested she set up a few alarms on her phone to remind her. She agrees that would be a good idea as she uses alarms for other things in her daily routine. Also spoke with her about eating adequate protein and carbs to support energy and increased needs. She is doing that most meals but sometimes doesnt feel hungry and will miss dinner or use a snack instead.       Expected Outcome Short: Drink 3 bottles of water and dont miss meals. Long: Maintain healthy hydration and eating habits                Nutrition Goals Discharge (Final Nutrition Goals Re-Evaluation):  Nutrition Goals Re-Evaluation - 07/25/23 4401  Goals   Nutrition Goal Drink 3 bottles of water per day    Comment She isnt drinking as much water as she should be. Spoke with her baout the barriers to this goals, concluded together that she is likely forgetting. Suggested she set up a few alarms on her phone to remind her. She agrees that would be a good idea as she uses alarms for other things in her daily routine. Also spoke with her about eating adequate protein and carbs to support energy and increased needs. She is doing that most meals but sometimes doesnt feel hungry and will miss dinner or use a snack instead.    Expected Outcome Short: Drink 3 bottles of water and dont miss meals. Long: Maintain healthy hydration and eating habits             Psychosocial: Target  Goals: Acknowledge presence or absence of significant depression and/or stress, maximize coping skills, provide positive support system. Participant is able to verbalize types and ability to use techniques and skills needed for reducing stress and depression.   Education: Stress, Anxiety, and Depression - Group verbal and visual presentation to define topics covered.  Reviews how body is impacted by stress, anxiety, and depression.  Also discusses healthy ways to reduce stress and to treat/manage anxiety and depression.  Written material given at graduation.   Education: Sleep Hygiene -Provides group verbal and written instruction about how sleep can affect your health.  Define sleep hygiene, discuss sleep cycles and impact of sleep habits. Review good sleep hygiene tips.    Initial Review & Psychosocial Screening:  Initial Psych Review & Screening - 06/21/23 1339       Initial Review   Current issues with Current Anxiety/Panic      Family Dynamics   Good Support System? Yes    Comments Her husband has some dementia and other health issues. She can call her daughters if she gets anxiety she has some medication for it.      Barriers   Psychosocial barriers to participate in program The patient should benefit from training in stress management and relaxation.      Screening Interventions   Interventions Encouraged to exercise;To provide support and resources with identified psychosocial needs;Provide feedback about the scores to participant    Expected Outcomes Short Term goal: Utilizing psychosocial counselor, staff and physician to assist with identification of specific Stressors or current issues interfering with healing process. Setting desired goal for each stressor or current issue identified.;Long Term Goal: Stressors or current issues are controlled or eliminated.;Short Term goal: Identification and review with participant of any Quality of Life or Depression concerns found by scoring  the questionnaire.;Long Term goal: The participant improves quality of Life and PHQ9 Scores as seen by post scores and/or verbalization of changes             Quality of Life Scores:  Scores of 19 and below usually indicate a poorer quality of life in these areas.  A difference of  2-3 points is a clinically meaningful difference.  A difference of 2-3 points in the total score of the Quality of Life Index has been associated with significant improvement in overall quality of life, self-image, physical symptoms, and general health in studies assessing change in quality of life.  PHQ-9: Review Flowsheet       06/28/2023  Depression screen PHQ 2/9  Decreased Interest 0  Down, Depressed, Hopeless 0  PHQ - 2 Score 0  Altered sleeping 0  Tired, decreased energy 1  Change in appetite 1  Feeling bad or failure about yourself  1  Trouble concentrating 0  Moving slowly or fidgety/restless 0  Suicidal thoughts 0  PHQ-9 Score 3    Details           Interpretation of Total Score  Total Score Depression Severity:  1-4 = Minimal depression, 5-9 = Mild depression, 10-14 = Moderate depression, 15-19 = Moderately severe depression, 20-27 = Severe depression   Psychosocial Evaluation and Intervention:  Psychosocial Evaluation - 06/21/23 1342       Psychosocial Evaluation & Interventions   Interventions Encouraged to exercise with the program and follow exercise prescription;Relaxation education;Stress management education    Comments Her husband has some dementia and other health issues. She can call her daughters if she gets anxiety she has some medication for it.    Expected Outcomes Short: Start [facility] to help with mood. Long: Maintain a healthy mental state.    Continue Psychosocial Services  Follow up required by staff             Psychosocial Re-Evaluation:  Psychosocial Re-Evaluation     Row Name 07/25/23 0930             Psychosocial Re-Evaluation   Current  issues with None Identified       Comments She is not dealing with any drepssion or anxiety stressors. She reports she has good social support and relies in prayer and friends. holidays are busy and she needs to get the house ready.       Expected Outcomes short: stay focus and postive during holidays.Long: stay positive and use social support       Interventions Encouraged to attend Pulmonary Rehabilitation for the exercise       Continue Psychosocial Services  Follow up required by staff                Psychosocial Discharge (Final Psychosocial Re-Evaluation):  Psychosocial Re-Evaluation - 07/25/23 0930       Psychosocial Re-Evaluation   Current issues with None Identified    Comments She is not dealing with any drepssion or anxiety stressors. She reports she has good social support and relies in prayer and friends. holidays are busy and she needs to get the house ready.    Expected Outcomes short: stay focus and postive during holidays.Long: stay positive and use social support    Interventions Encouraged to attend Pulmonary Rehabilitation for the exercise    Continue Psychosocial Services  Follow up required by staff             Education: Education Goals: Education classes will be provided on a weekly basis, covering required topics. Participant will state understanding/return demonstration of topics presented.  Learning Barriers/Preferences:  Learning Barriers/Preferences - 06/21/23 1338       Learning Barriers/Preferences   Learning Barriers Hearing    Learning Preferences None             General Pulmonary Education Topics:  Infection Prevention: - Provides verbal and written material to individual with discussion of infection control including proper hand washing and proper equipment cleaning during exercise session. Flowsheet Row Pulmonary Rehab from 06/28/2023 in [facility] Cardiac and Pulmonary Rehab  Date 06/28/23  Educator MB  Instruction Review Code 1-  Verbalizes Understanding       Falls Prevention: - Provides verbal and written material to individual with discussion of falls prevention and safety. Flowsheet Row Pulmonary Rehab from 06/28/2023 in [facility]  Cardiac and Pulmonary Rehab  Date 06/28/23  Educator MB  Instruction Review Code 1- Verbalizes Understanding       Chronic Lung Disease Review: - Group verbal instruction with posters, models, PowerPoint presentations and videos,  to review new updates, new respiratory medications, new advancements in procedures and treatments. Providing information on websites and "800" numbers for continued self-education. Includes information about supplement oxygen, available portable oxygen systems, continuous and intermittent flow rates, oxygen safety, concentrators, and Medicare reimbursement for oxygen. Explanation of Pulmonary Drugs, including class, frequency, complications, importance of spacers, rinsing mouth after steroid MDI's, and proper cleaning methods for nebulizers. Review of basic lung anatomy and physiology related to function, structure, and complications of lung disease. Review of risk factors. Discussion about methods for diagnosing sleep apnea and types of masks and machines for OSA. Includes a review of the use of types of environmental controls: home humidity, furnaces, filters, dust mite/pet prevention, HEPA vacuums. Discussion about weather changes, air quality and the benefits of nasal washing. Instruction on Warning signs, infection symptoms, calling MD promptly, preventive modes, and value of vaccinations. Review of effective airway clearance, coughing and/or vibration techniques. Emphasizing that all should Create an Action Plan. Written material given at graduation. Flowsheet Row Pulmonary Rehab from 06/28/2023 in [facility] Cardiac and Pulmonary Rehab  Education need identified 06/28/23       AED/CPR: - Group verbal and written instruction with the use of models to demonstrate the  basic use of the AED with the basic ABC's of resuscitation.    Anatomy and Cardiac Procedures: - Group verbal and visual presentation and models provide information about basic cardiac anatomy and function. Reviews the testing methods done to diagnose heart disease and the outcomes of the test results. Describes the treatment choices: Medical Management, Angioplasty, or Coronary Bypass Surgery for treating various heart conditions including Myocardial Infarction, Angina, Valve Disease, and Cardiac Arrhythmias.  Written material given at graduation.   Medication Safety: - Group verbal and visual instruction to review commonly prescribed medications for heart and lung disease. Reviews the medication, class of the drug, and side effects. Includes the steps to properly store meds and maintain the prescription regimen.  Written material given at graduation.   Other: -Provides group and verbal instruction on various topics (see comments)   Knowledge Questionnaire Score:  Knowledge Questionnaire Score - 06/28/23 1554       Knowledge Questionnaire Score   Pre Score 16/18              Core Components/Risk Factors/Patient Goals at Admission:  Personal Goals and Risk Factors at Admission - 06/28/23 1539       Core Components/Risk Factors/Patient Goals on Admission    Weight Management Weight Loss;Yes    Intervention Weight Management: Develop a combined nutrition and exercise program designed to reach desired caloric intake, while maintaining appropriate intake of nutrient and fiber, sodium and fats, and appropriate energy expenditure required for the weight goal.;Weight Management: Provide education and appropriate resources to help participant work on and attain dietary goals.;Weight Management/Obesity: Establish reasonable short term and long term weight goals.    Admit Weight 182 lb 3.2 oz (82.6 kg)    Goal Weight: Short Term 172 lb 3.2 oz (78.1 kg)    Goal Weight: Long Term 162 lb 3.2  oz (73.6 kg)    Expected Outcomes Short Term: Continue to assess and modify interventions until short term weight is achieved;Long Term: Adherence to nutrition and physical activity/exercise program aimed toward attainment of established  weight goal;Weight Loss: Understanding of general recommendations for a balanced deficit meal plan, which promotes 1-2 lb weight loss per week and includes a negative energy balance of [phone] kcal/d;Understanding recommendations for meals to include 15-35% energy as protein, 25-35% energy from fat, 35-60% energy from carbohydrates, less than 200mg  of dietary cholesterol, 20-35 gm of total fiber daily;Understanding of distribution of calorie intake throughout the day with the consumption of 4-5 meals/snacks    Improve shortness of breath with ADL's Yes    Intervention Provide education, individualized exercise plan and daily activity instruction to help decrease symptoms of SOB with activities of daily living.    Expected Outcomes Long Term: Be able to perform more ADLs without symptoms or delay the onset of symptoms;Short Term: Improve cardiorespiratory fitness to achieve a reduction of symptoms when performing ADLs    Hypertension Yes    Intervention Provide education on lifestyle modifcations including regular physical activity/exercise, weight management, moderate sodium restriction and increased consumption of fresh fruit, vegetables, and low fat dairy, alcohol moderation, and smoking cessation.;Monitor prescription use compliance.    Expected Outcomes Short Term: Continued assessment and intervention until BP is < 140/12mm HG in hypertensive participants. < 130/58mm HG in hypertensive participants with diabetes, heart failure or chronic kidney disease.;Long Term: Maintenance of blood pressure at goal levels.    Lipids Yes    Intervention Provide education and support for participant on nutrition & aerobic/resistive exercise along with prescribed medications to  achieve LDL 70mg , HDL >40mg .    Expected Outcomes Short Term: Participant states understanding of desired cholesterol values and is compliant with medications prescribed. Participant is following exercise prescription and nutrition guidelines.;Long Term: Cholesterol controlled with medications as prescribed, with individualized exercise RX and with personalized nutrition plan. Value goals: LDL < 70mg , HDL > 40 mg.             Education:Diabetes - Individual verbal and written instruction to review signs/symptoms of diabetes, desired ranges of glucose level fasting, after meals and with exercise. Acknowledge that pre and post exercise glucose checks will be done for 3 sessions at entry of program.   Know Your Numbers and Heart Failure: - Group verbal and visual instruction to discuss disease risk factors for cardiac and pulmonary disease and treatment options.  Reviews associated critical values for Overweight/Obesity, Hypertension, Cholesterol, and Diabetes.  Discusses basics of heart failure: signs/symptoms and treatments.  Introduces Heart Failure Zone chart for action plan for heart failure.  Written material given at graduation.   Core Components/Risk Factors/Patient Goals Review:   Goals and Risk Factor Review     Row Name 07/25/23 0935             Core Components/Risk Factors/Patient Goals Review   Personal Goals Review Weight Management/Obesity       Review Patient is eating better snacks, working on drinking more water, choosing healthy foods but is frustrated at not losing weight. Spoke with her about the importance of not missing meals as she sometimes does, how low caloire foods are often healthy but can make it hard to meet caloire goals when not eaten in larger quantities. Educated her on the use of healthy fats for calorie dense options.       Expected Outcomes Short: do not miss meals, use healthy fats to meet caloire needs for weight loss. Long: Maintain healthy eating  habits to support weight loss                Core Components/Risk Factors/Patient Goals  at Discharge (Final Review):   Goals and Risk Factor Review - 07/25/23 0935       Core Components/Risk Factors/Patient Goals Review   Personal Goals Review Weight Management/Obesity    Review Patient is eating better snacks, working on drinking more water, choosing healthy foods but is frustrated at not losing weight. Spoke with her about the importance of not missing meals as she sometimes does, how low caloire foods are often healthy but can make it hard to meet caloire goals when not eaten in larger quantities. Educated her on the use of healthy fats for calorie dense options.    Expected Outcomes Short: do not miss meals, use healthy fats to meet caloire needs for weight loss. Long: Maintain healthy eating habits to support weight loss             ITP Comments:  ITP Comments     Row Name 06/21/23 1336 06/28/23 1556 07/05/23 0834 07/06/23 0937 07/26/23 1123   ITP Comments Virtual Visit completed. Patient informed on EP and RD appointment and 6 Minute walk test. Patient also informed of patient health questionnaires on My Chart. Patient Verbalizes understanding. Visit diagnosis can be found in [facility] 06/13/2023. Completed and gym orientation. Initial ITP created and sent for review to Dr. Jinny Sanders, Medical Director. 30 Day review completed. Medical Director ITP review done, changes made as directed, and signed approval by Medical Director.    new to program First full day of exercise!  Patient was oriented to gym and equipment including functions, settings, policies, and procedures.  Patient's individual exercise prescription and treatment plan were reviewed.  All starting workloads were established based on the results of the 6 minute walk test done at initial orientation visit.  The plan for exercise progression was also introduced and progression will be customized based on patient's  performance and goals. 30 Day review completed. Medical Director ITP review done, changes made as directed, and signed approval by Medical Director.    new to program            Comments:

## 2023-07-27 ENCOUNTER — Encounter: Payer: Medicare Other | Admitting: *Deleted

## 2023-07-27 DIAGNOSIS — Z5189 Encounter for other specified aftercare: Secondary | ICD-10-CM | POA: Diagnosis not present

## 2023-07-27 DIAGNOSIS — D869 Sarcoidosis, unspecified: Secondary | ICD-10-CM

## 2023-07-27 NOTE — Progress Notes (Signed)
Daily Session Note  Patient Details  Name: Megan Daniels MRN: [id number] Date of Birth: 05-07-1951 Referring Provider:   Flowsheet Row Pulmonary Rehab from 06/28/2023 in [facility] Cardiac and Pulmonary Rehab  Referring Provider Vida Rigger, MD       Encounter Date: 07/27/2023  Check In:  Session Check In - 07/27/23 1010       Check-In   Supervising physician immediately available to respond to emergencies See telemetry face sheet for immediately available ER MD    Location [facility]-Cardiac & Pulmonary Rehab    Staff Present Ronette Deter, BS, Exercise Physiologist;Joseph Ashland, RCP,RRT,BSRT;Maxon [place] BS, , Exercise Physiologist;Laquanda Bick Katrinka Blazing, RN, ADN    Virtual Visit No    Medication changes reported     No    Fall or balance concerns reported    No    Warm-up and Cool-down Performed on first and last piece of equipment    Resistance Training Performed Yes    VAD Patient? No    PAD/SET Patient? No      Pain Assessment   Currently in Pain? No/denies                Social History   Tobacco Use  Smoking Status Never  Smokeless Tobacco Never    Goals Met:  Independence with exercise equipment Exercise tolerated well No report of concerns or symptoms today Strength training completed today  Goals Unmet:  Not Applicable  Comments: Pt able to follow exercise prescription today without complaint.  Will continue to monitor for progression.    Dr. Bethann Punches is Medical Director for [facility] Cardiac Rehabilitation.  Dr. Vida Rigger is Medical Director for [facility] Pul[facility].

## 2023-07-28 ENCOUNTER — Encounter: Payer: Medicare Other | Admitting: *Deleted

## 2023-07-28 DIAGNOSIS — D869 Sarcoidosis, unspecified: Secondary | ICD-10-CM

## 2023-07-28 DIAGNOSIS — Z5189 Encounter for other specified aftercare: Secondary | ICD-10-CM | POA: Diagnosis not present

## 2023-07-28 NOTE — Progress Notes (Signed)
Daily Session Note  Patient Details  Name: Megan Daniels MRN: [id number] Date of Birth: 1951-03-08 Referring Provider:   Flowsheet Row Pulmonary Rehab from 06/28/2023 in [facility] Cardiac and Pulmonary Rehab  Referring Provider Vida Rigger, MD       Encounter Date: 07/28/2023  Check In:  Session Check In - 07/28/23 1003       Check-In   Supervising physician immediately available to respond to emergencies See telemetry face sheet for immediately available ER MD    Location [facility]-Cardiac & Pulmonary Rehab    Staff Present Cora Collum, RN, BSN, CCRP;Noah Tickle, BS, Exercise Physiologist;Joseph [place], [place]    Virtual Visit No    Medication changes reported     No    Fall or balance concerns reported    No    Warm-up and Cool-down Performed on first and last piece of equipment    Resistance Training Performed Yes    VAD Patient? No    PAD/SET Patient? No      Pain Assessment   Currently in Pain? No/denies                Social History   Tobacco Use  Smoking Status Never  Smokeless Tobacco Never    Goals Met:  Proper associated with RPD/PD & O2 Sat Independence with exercise equipment Exercise tolerated well No report of concerns or symptoms today  Goals Unmet:  Not Applicable  Comments: Pt able to follow exercise prescription today without complaint.  Will continue to monitor for progression.    Dr. Bethann Punches is Medical Director for [facility] Cardiac Rehabilitation.  Dr. Vida Rigger is Medical Director for [facility] Pulmonary Rehabilitation.

## 2023-07-31 ENCOUNTER — Ambulatory Visit
Admission: RE | Admit: 2023-07-31 | Discharge: 2023-07-31 | Disposition: A | Discharge status: Home / Self Care | Payer: Medicare Other | Source: Ambulatory Visit | Attending: Internal Medicine | Admitting: Internal Medicine

## 2023-07-31 DIAGNOSIS — Z1231 Encounter for screening mammogram for malignant neoplasm of breast: Secondary | ICD-10-CM | POA: Diagnosis present

## 2023-08-01 ENCOUNTER — Encounter: Payer: Medicare Other | Admitting: *Deleted

## 2023-08-01 DIAGNOSIS — D869 Sarcoidosis, unspecified: Secondary | ICD-10-CM

## 2023-08-01 DIAGNOSIS — Z5189 Encounter for other specified aftercare: Secondary | ICD-10-CM | POA: Diagnosis not present

## 2023-08-01 NOTE — Progress Notes (Signed)
Daily Session Note  Patient Details  Name: Megan Daniels MRN: [id number] Date of Birth: 1951-02-08 Referring Provider:   Flowsheet Row Pulmonary Rehab from 06/28/2023 in [facility] Cardiac and Pulmonary Rehab  Referring Provider Vida Rigger, MD       Encounter Date: 08/01/2023  Check In:  Session Check In - 08/01/23 0946       Check-In   Supervising physician immediately available to respond to emergencies See telemetry face sheet for immediately available ER MD    Location [facility]-Cardiac & Pulmonary Rehab    Staff Present Cora Collum, RN, BSN, CCRP;Margaret Best, MS, Exercise Physiologist;Meredith Jewel Baize, RN BSN;Maxon Conetta BS, , Exercise Physiologist;Noah Tickle, BS, Exercise Physiologist    Virtual Visit No    Medication changes reported     No    Fall or balance concerns reported    No    Warm-up and Cool-down Performed on first and last piece of equipment    Resistance Training Performed Yes    VAD Patient? No    PAD/SET Patient? No      Pain Assessment   Currently in Pain? No/denies                Social History   Tobacco Use  Smoking Status Never  Smokeless Tobacco Never    Goals Met:  Proper associated with RPD/PD & O2 Sat Independence with exercise equipment Exercise tolerated well No report of concerns or symptoms today  Goals Unmet:  Not Applicable  Comments: Pt able to follow exercise prescription today without complaint.  Will continue to monitor for progression.    Dr. Bethann Punches is Medical Director for [facility] Cardiac Rehabilitation.  Dr. Vida Rigger is Medical Director for [facility] Pulmonary Rehabilitation.

## 2023-08-08 ENCOUNTER — Encounter: Payer: Medicare Other | Attending: Pulmonary Disease | Admitting: *Deleted

## 2023-08-08 DIAGNOSIS — D869 Sarcoidosis, unspecified: Secondary | ICD-10-CM | POA: Diagnosis present

## 2023-08-08 DIAGNOSIS — D860 Sarcoidosis of lung: Secondary | ICD-10-CM | POA: Insufficient documentation

## 2023-08-08 DIAGNOSIS — R0600 Dyspnea, unspecified: Secondary | ICD-10-CM | POA: Insufficient documentation

## 2023-08-08 NOTE — Progress Notes (Signed)
Daily Session Note  Patient Details  Name: Megan Daniels MRN: [id number] Date of Birth: 1951/02/26 Referring Provider:   Flowsheet Row Pulmonary Rehab from 06/28/2023 in [facility] Cardiac and Pulmonary Rehab  Referring Provider Vida Rigger, MD       Encounter Date: 08/08/2023  Check In:  Session Check In - 08/08/23 0934       Check-In   Supervising physician immediately available to respond to emergencies See telemetry face sheet for immediately available ER MD    Location [facility]-Cardiac & Pulmonary Rehab    Staff Present Cora Collum, RN, BSN, CCRP;Margaret Best, MS, Exercise Physiologist;Maxon Conetta BS, , Exercise Physiologist;Noah Tickle, BS, Exercise Physiologist    Virtual Visit No    Medication changes reported     No    Fall or balance concerns reported    No    Warm-up and Cool-down Performed on first and last piece of equipment    Resistance Training Performed Yes    VAD Patient? No    PAD/SET Patient? No      Pain Assessment   Currently in Pain? No/denies                Social History   Tobacco Use  Smoking Status Never  Smokeless Tobacco Never    Goals Met:  Proper associated with RPD/PD & O2 Sat Independence with exercise equipment Exercise tolerated well No report of concerns or symptoms today  Goals Unmet:  Not Applicable  Comments: Pt able to follow exercise prescription today without complaint.  Will continue to monitor for progression.    Dr. Bethann Punches is Medical Director for [facility] Cardiac Rehabilitation.  Dr. Vida Rigger is Medical Director for [facility] Pulmonary Rehabilitation.

## 2023-08-10 ENCOUNTER — Encounter: Payer: Medicare Other | Admitting: *Deleted

## 2023-08-10 DIAGNOSIS — D860 Sarcoidosis of lung: Secondary | ICD-10-CM | POA: Diagnosis not present

## 2023-08-10 DIAGNOSIS — D869 Sarcoidosis, unspecified: Secondary | ICD-10-CM

## 2023-08-10 NOTE — Progress Notes (Signed)
Daily Session Note  Patient Details  Name: Megan Daniels MRN: [id number] Date of Birth: 04-10-1951 Referring Provider:   Flowsheet Row Pulmonary Rehab from 06/28/2023 in [facility] Cardiac and Pulmonary Rehab  Referring Provider Vida Rigger, MD       Encounter Date: 08/10/2023  Check In:  Session Check In - 08/10/23 1002       Check-In   Supervising physician immediately available to respond to emergencies See telemetry face sheet for immediately available ER MD    Location [facility]-Cardiac & Pulmonary Rehab    Staff Present Rory Percy, MS, Exercise Physiologist;Demoni Gergen, RN, BSN, CCRP;Joseph Hood, RCP,RRT,BSRT;Maxon [employer], , Exercise Physiologist;Noah Tickle, BS, Exercise Physiologist    Virtual Visit No    Medication changes reported     No    Fall or balance concerns reported    No    Warm-up and Cool-down Performed on first and last piece of equipment    Resistance Training Performed Yes    VAD Patient? No    PAD/SET Patient? No      Pain Assessment   Currently in Pain? No/denies                Social History   Tobacco Use  Smoking Status Never  Smokeless Tobacco Never    Goals Met:  Proper associated with RPD/PD & O2 Sat Independence with exercise equipment Exercise tolerated well No report of concerns or symptoms today  Goals Unmet:  Not Applicable  Comments: Pt able to follow exercise prescription today without complaint.  Will continue to monitor for progression.    Dr. Bethann Punches is Medical Director for [facility] Cardiac Rehabilitation.  Dr. Vida Rigger is Medical Director for [facility] Pulmonary Rehabilitation.

## 2023-08-11 ENCOUNTER — Encounter: Payer: Medicare Other | Admitting: *Deleted

## 2023-08-11 DIAGNOSIS — D860 Sarcoidosis of lung: Secondary | ICD-10-CM | POA: Diagnosis not present

## 2023-08-11 DIAGNOSIS — D869 Sarcoidosis, unspecified: Secondary | ICD-10-CM

## 2023-08-11 NOTE — Progress Notes (Signed)
Daily Session Note  Patient Details  Name: Megan Daniels MRN: [id number] Date of Birth: July 23, 1951 Referring Provider:   Flowsheet Row Pulmonary Rehab from 06/28/2023 in [facility] Cardiac and Pulmonary Rehab  Referring Provider Vida Rigger, MD       Encounter Date: 08/11/2023  Check In:  Session Check In - 08/11/23 1004       Check-In   Supervising physician immediately available to respond to emergencies See telemetry face sheet for immediately available ER MD    Location [facility]-Cardiac & Pulmonary Rehab    Staff Present Cora Collum, RN, BSN, CCRP;Joseph Hood, RCP,RRT,BSRT;Noah Tickle, [place], Exercise Physiologist    Virtual Visit No    Medication changes reported     No    Fall or balance concerns reported    No    Warm-up and Cool-down Performed on first and last piece of equipment    Resistance Training Performed Yes    VAD Patient? No    PAD/SET Patient? No      Pain Assessment   Currently in Pain? No/denies                Social History   Tobacco Use  Smoking Status Never  Smokeless Tobacco Never    Goals Met:  Proper associated with RPD/PD & O2 Sat Independence with exercise equipment Exercise tolerated well No report of concerns or symptoms today  Goals Unmet:  Not Applicable  Comments: Pt able to follow exercise prescription today without complaint.  Will continue to monitor for progression.    Dr. Bethann Punches is Medical Director for [facility] Cardiac Rehabilitation.  Dr. Vida Rigger is Medical Director for [facility] Pulmonary Rehabilitation.

## 2023-08-15 ENCOUNTER — Encounter: Payer: Medicare Other | Admitting: *Deleted

## 2023-08-15 DIAGNOSIS — D869 Sarcoidosis, unspecified: Secondary | ICD-10-CM

## 2023-08-15 DIAGNOSIS — D860 Sarcoidosis of lung: Secondary | ICD-10-CM | POA: Diagnosis not present

## 2023-08-15 NOTE — Progress Notes (Signed)
Daily Session Note  Patient Details  Name: Megan Daniels MRN: [id number] Date of Birth: Aug 12, 1951 Referring Provider:   Flowsheet Row Pulmonary Rehab from 06/28/2023 in [facility] Cardiac and Pulmonary Rehab  Referring Provider Vida Rigger, MD       Encounter Date: 08/15/2023  Check In:  Session Check In - 08/15/23 0953       Check-In   Supervising physician immediately available to respond to emergencies See telemetry face sheet for immediately available ER MD    Location [facility]-Cardiac & Pulmonary Rehab    Staff Present Cora Collum, RN, BSN, CCRP;Margaret Best, MS, Exercise Physiologist;Noah Tickle, BS, Exercise Physiologist;Maxon Conetta BS, , Exercise Physiologist    Virtual Visit No    Medication changes reported     No    Fall or balance concerns reported    No    Warm-up and Cool-down Performed on first and last piece of equipment    Resistance Training Performed Yes    VAD Patient? No    PAD/SET Patient? No      Pain Assessment   Currently in Pain? No/denies                Social History   Tobacco Use  Smoking Status Never  Smokeless Tobacco Never    Goals Met:  Proper associated with RPD/PD & O2 Sat Independence with exercise equipment Exercise tolerated well No report of concerns or symptoms today  Goals Unmet:  Not Applicable  Comments: Pt able to follow exercise prescription today without complaint.  Will continue to monitor for progression.    Dr. Bethann Punches is Medical Director for [facility] Cardiac Rehabilitation.  Dr. Vida Rigger is Medical Director for [facility] Pulmonary Rehabilitation.

## 2023-08-17 ENCOUNTER — Encounter: Payer: Medicare Other | Admitting: *Deleted

## 2023-08-17 DIAGNOSIS — D869 Sarcoidosis, unspecified: Secondary | ICD-10-CM

## 2023-08-17 DIAGNOSIS — D860 Sarcoidosis of lung: Secondary | ICD-10-CM | POA: Diagnosis not present

## 2023-08-17 NOTE — Progress Notes (Signed)
Daily Session Note  Patient Details  Name: Megan Daniels MRN: [id number] Date of Birth: [birth year] Referring Provider:   Flowsheet Row Pulmonary Rehab from 06/28/2023 in [facility] Cardiac and Pulmonary Rehab  Referring Provider Vida Rigger, MD       Encounter Date: 08/17/2023  Check In:  Session Check In - 08/17/23 0936       Check-In   Supervising physician immediately available to respond to emergencies See telemetry face sheet for immediately available ER MD    Location [facility]-Cardiac & Pulmonary Rehab    Staff Present Ronette Deter, BS, Exercise Physiologist;Meredith Jewel Baize, RN BSN;Maxon Conetta BS, , Exercise Physiologist;Nydia Ytuarte Katrinka Blazing, RN, ADN    Virtual Visit No    Medication changes reported     No    Fall or balance concerns reported    No    Warm-up and Cool-down Performed on first and last piece of equipment    Resistance Training Performed Yes    VAD Patient? No    PAD/SET Patient? No      Pain Assessment   Currently in Pain? No/denies                Social History   Tobacco Use  Smoking Status Never  Smokeless Tobacco Never    Goals Met:  Independence with exercise equipment Exercise tolerated well No report of concerns or symptoms today Strength training completed today  Goals Unmet:  Not Applicable  Comments: Pt able to follow exercise prescription today without complaint.  Will continue to monitor for progression.    Dr. Bethann Punches is Medical Director for [facility] Cardiac Rehabilitation.  Dr. Vida Rigger is Medical Director for [facility] Pulmonary Rehabilitation.

## 2023-08-18 ENCOUNTER — Encounter: Payer: Medicare Other | Admitting: *Deleted

## 2023-08-18 DIAGNOSIS — D860 Sarcoidosis of lung: Secondary | ICD-10-CM | POA: Diagnosis not present

## 2023-08-18 DIAGNOSIS — D869 Sarcoidosis, unspecified: Secondary | ICD-10-CM

## 2023-08-18 NOTE — Progress Notes (Signed)
Daily Session Note  Patient Details  Name: Megan Daniels MRN: [id number] Date of Birth: 1951/04/15 Referring Provider:   Flowsheet Row Pulmonary Rehab from 06/28/2023 in [facility] Cardiac and Pulmonary Rehab  Referring Provider Vida Rigger, MD       Encounter Date: 08/18/2023  Check In:  Session Check In - 08/18/23 0907       Check-In   Supervising physician immediately available to respond to emergencies See telemetry face sheet for immediately available ER MD    Location [facility]-Cardiac & Pulmonary Rehab    Staff Present Ronette Deter, BS, Exercise Physiologist;Joseph Shelbie Proctor, RN, [place]    Virtual Visit No    Medication changes reported     Yes    Comments started metformin    Fall or balance concerns reported    No    Warm-up and Cool-down Performed on first and last piece of equipment    Resistance Training Performed Yes    VAD Patient? No      Pain Assessment   Currently in Pain? No/denies                Social History   Tobacco Use  Smoking Status Never  Smokeless Tobacco Never    Goals Met:  Independence with exercise equipment Exercise tolerated well No report of concerns or symptoms today Strength training completed today  Goals Unmet:  Not Applicable  Comments: Pt able to follow exercise prescription today without complaint.  Will continue to monitor for progression.    Dr. Bethann Punches is Medical Director for [facility] Cardiac Rehabilitation.  Dr. Vida Rigger is Medical Director for [facility] Pulmonary Rehabilitation.

## 2023-08-22 ENCOUNTER — Encounter: Payer: Medicare Other | Admitting: *Deleted

## 2023-08-22 DIAGNOSIS — D869 Sarcoidosis, unspecified: Secondary | ICD-10-CM

## 2023-08-22 DIAGNOSIS — D860 Sarcoidosis of lung: Secondary | ICD-10-CM | POA: Diagnosis not present

## 2023-08-22 NOTE — Progress Notes (Signed)
Daily Session Note  Patient Details  Name: Megan Daniels MRN: [id number] Date of Birth: December 31, 1950 Referring Provider:   Flowsheet Row Pulmonary Rehab from 06/28/2023 in [facility] Cardiac and Pulmonary Rehab  Referring Provider Vida Rigger, MD       Encounter Date: 08/22/2023  Check In:  Session Check In - 08/22/23 0924       Check-In   Supervising physician immediately available to respond to emergencies See telemetry face sheet for immediately available ER MD    Location [facility]-Cardiac & Pulmonary Rehab    Staff Present Cora Collum, RN, BSN, CCRP;Margaret Best, MS, Exercise Physiologist;Noah Tickle, BS, Exercise Physiologist;Maxon Conetta BS, , Exercise Physiologist    Virtual Visit No    Medication changes reported     No    Fall or balance concerns reported    No    Warm-up and Cool-down Performed on first and last piece of equipment    Resistance Training Performed Yes    VAD Patient? No    PAD/SET Patient? No      Pain Assessment   Currently in Pain? No/denies                Social History   Tobacco Use  Smoking Status Never  Smokeless Tobacco Never    Goals Met:  Proper associated with RPD/PD & O2 Sat Independence with exercise equipment Exercise tolerated well No report of concerns or symptoms today  Goals Unmet:  Not Applicable  Comments: Pt able to follow exercise prescription today without complaint.  Will continue to monitor for progression.    Dr. Bethann Punches is Medical Director for [facility] Cardiac Rehabilitation.  Dr. Vida Rigger is Medical Director for [facility] Pulmonary Rehabilitation.

## 2023-08-23 ENCOUNTER — Encounter: Payer: Self-pay | Admitting: *Deleted

## 2023-08-23 DIAGNOSIS — D869 Sarcoidosis, unspecified: Secondary | ICD-10-CM

## 2023-08-23 NOTE — Progress Notes (Signed)
Pulmonary Individual Treatment Plan  Patient Details  Name: Megan Daniels MRN: [id number] Date of Birth: 1951-07-03 Referring Provider:   Flowsheet Row Pulmonary Rehab from 06/28/2023 in [facility] Cardiac and Pulmonary Rehab  Referring Provider Vida Rigger, MD       Initial Encounter Date:  Flowsheet Row Pulmonary Rehab from 06/28/2023 in [facility] Cardiac and Pulmonary Rehab  Date 06/28/23       Visit Diagnosis: Sarcoidosis  Patient's Home Medications on Admission:  Current Outpatient Medications:    acetaminophen (TYLENOL) 500 MG tablet, Take 1,000 mg by mouth every 6 (six) hours as needed for moderate pain or headache., Disp: , Rfl:    albuterol (VENTOLIN HFA) 108 (90 Base) MCG/ACT inhaler, Inhale 2 puffs into the lungs every 4 (four) hours as needed for wheezing or shortness of breath.  (Patient not taking: Reported on 11/25/2022), Disp: , Rfl:    albuterol (VENTOLIN HFA) 108 (90 Base) MCG/ACT inhaler, Inhale into the lungs., Disp: , Rfl:    amLODipine (NORVASC) 5 MG tablet, Take 5 mg by mouth daily., Disp: , Rfl:    aspirin EC 81 MG tablet, Take 1 tablet (81 mg total) by mouth daily. (Patient taking differently: Take 81 mg by mouth every evening.), Disp: , Rfl:    Black Elderberry (SAMBUCUS ELDERBERRY) 50 MG/5ML SYRP, Take by mouth., Disp: , Rfl:    busPIRone (BUSPAR) 10 MG tablet, Take 10 mg by mouth 2 (two) times daily. (Patient not taking: Reported on 06/21/2023), Disp: , Rfl:    carvedilol (COREG) 12.5 MG tablet, Take 12.5 mg by mouth., Disp: , Rfl:    carvedilol (COREG) 12.5 MG tablet, Take by mouth., Disp: , Rfl:    cephALEXin (KEFLEX) 500 MG capsule, Take 500 mg by mouth 3 (three) times daily. (Patient not taking: Reported on 06/21/2023), Disp: , Rfl:    cetirizine (ZYRTEC) 10 MG chewable tablet, Chew by mouth., Disp: , Rfl:    cetirizine (ZYRTEC) 10 MG tablet, Take by mouth. (Patient not taking: Reported on 06/21/2023), Disp: , Rfl:    Cholecalciferol (VITAMIN D) 2000  units CAPS, Take 2,000 Units by mouth daily. , Disp: , Rfl:    Cholecalciferol (VITAMIN D3) 50 MCG (2000 UT) capsule, Take by mouth., Disp: , Rfl:    Collagen Matrix, Porcine, 2 X 2 X 2 CM MISC, Use 1,000 mg once daily, Disp: , Rfl:    cyanocobalamin (VITAMIN B12) 500 MCG tablet, Take by mouth., Disp: , Rfl:    docusate sodium (COLACE) 100 MG capsule, Take 100 mg by mouth 2 (two) times daily. , Disp: , Rfl:    DULoxetine (CYMBALTA) 30 MG capsule, Take by mouth., Disp: , Rfl:    famotidine (PEPCID) 40 MG tablet, Take by mouth., Disp: , Rfl:    fluticasone (FLONASE) 50 MCG/ACT nasal spray, Place into the nose., Disp: , Rfl:    folic acid (FOLVITE) 1 MG tablet, Take by mouth., Disp: , Rfl:    gabapentin (NEURONTIN) 300 MG capsule, Take by mouth. (Patient not taking: Reported on 06/21/2023), Disp: , Rfl:    gabapentin (NEURONTIN) 300 MG capsule, Take by mouth., Disp: , Rfl:    irbesartan (AVAPRO) 300 MG tablet, TAKE 1 TABLET BY MOUTH DAILY, Disp: , Rfl:    irbesartan (AVAPRO) 300 MG tablet, Take by mouth. (Patient not taking: Reported on 06/21/2023), Disp: , Rfl:    losartan (COZAAR) 100 MG tablet, Take 100 mg by mouth at bedtime.  (Patient not taking: Reported on 11/25/2022), Disp: , Rfl:  magnesium oxide (MAG-OX) 400 MG tablet, Take by mouth., Disp: , Rfl:    metaxalone (SKELAXIN) 800 MG tablet, Take by mouth., Disp: , Rfl:    metoprolol succinate (TOPROL-XL) 25 MG 24 hr tablet, Take 25 mg by mouth 2 (two) times daily.  (Patient not taking: Reported on 11/25/2022), Disp: , Rfl:    Multiple Vitamin (MULTIVITAMIN WITH MINERALS) TABS tablet, Take 1 tablet by mouth daily., Disp: , Rfl:    omeprazole (PRILOSEC) 40 MG capsule, TAKE 1 CAPSULE DAILY BEFORE BREAKFAST, Disp: 90 capsule, Rfl: 3   omeprazole (PRILOSEC) 40 MG capsule, Take by mouth. (Patient not taking: Reported on 06/21/2023), Disp: , Rfl:    predniSONE (DELTASONE) 2.5 MG tablet, Take by mouth. (Patient not taking: Reported on 06/21/2023),  Disp: , Rfl:    sulfamethoxazole-trimethoprim (BACTRIM) 400-80 MG tablet, Take 1 tablet by mouth 3 (three) times a week. (Patient not taking: Reported on 06/21/2023), Disp: , Rfl:    topiramate (TOPAMAX) 50 MG tablet, Take 50 mg by mouth 2 (two) times daily., Disp: , Rfl:    vitamin B-12 (CYANOCOBALAMIN) 500 MCG tablet, Take 500 mcg by mouth daily. , Disp: , Rfl:    vitamin C (ASCORBIC ACID) 500 MG tablet, Take 500 mg by mouth daily., Disp: , Rfl:    Vitamins/Minerals TABS, Take by mouth., Disp: , Rfl:   Past Medical History: Past Medical History:  Diagnosis Date   Abnormal Pap smear of cervix    pos hpv-    Acid reflux    Anxiety    Arthritis    Asthma    Symbicort as needed   Cancer (HCC)    Basal Cell Skin Cancer   Chalazion of left eye    Chronic back pain    spondylolisthesis/stenosis   Chronic UTI    was treated for 3 months with Macrobid and in Dec everything was fine   Constipation    COPD (chronic obstructive pulmonary disease) (HCC)    Diverticulosis    Elevated serum creatinine    with nsaids   Foot drop    right   Genital warts    h/o   GERD (gastroesophageal reflux disease)    takes Prevacid daily   Headache    History of bronchitis    History of colon polyps    History of hiatal hernia    Hx of vertigo    Hypertension    takes Losartan daily   IBS (irritable bowel syndrome)    Joint pain    Palpitations    takes Metoprolol daily   Pneumonia    hx of-early 2000's   PONV (postoperative nausea and vomiting)    extreme nausea and vomiting -none last surgery 10/29/14   Sarcoidosis    Seasonal allergies    takes Claritin daily   Shortness of breath dyspnea    Sleep apnea    cpap 10 yrs   Stress incontinence    Thyroid nodule    URI (upper respiratory infection)    treated self with Mucinex and no fever   Urinary urgency    Vitamin D deficiency    Weakness    numbness and tingling in both legs    Tobacco Use: Social History   Tobacco Use   Smoking Status Never  Smokeless Tobacco Never    Labs: Review Flowsheet        No data to display           Pulmonary Assessment Scores:  Pulmonary Assessment Scores  Row Name 06/28/23 1537         ADL UCSD   SOB Score total 30     Rest 0     Walk 0     Stairs 1     Bath 1     Dress 1     Shop 1       CAT Score   CAT Score 16       mMRC Score   mMRC Score 1              UCSD: Self-administered rating of dyspnea associated with activities of daily living (ADLs) 6-point scale (0 = "not at all" to 5 = "maximal or unable to do because of breathlessness")  Scoring Scores range from 0 to 120.  Minimally important difference is 5 units  CAT: CAT can identify the health impairment of COPD patients and is better correlated with disease progression.  CAT has a scoring range of zero to 40. The CAT score is classified into four groups of low (less than 10), medium (10 - 20), high (21-30) and very high (31-40) based on the impact level of disease on health status. A CAT score over 10 suggests significant symptoms.  A worsening CAT score could be explained by an exacerbation, poor medication adherence, poor inhaler technique, or progression of COPD or comorbid conditions.  CAT MCID is 2 points  mMRC: mMRC (Modified Medical Research Council) Dyspnea Scale is used to assess the degree of baseline functional disability in patients of respiratory disease due to dyspnea. No minimal important difference is established. A decrease in score of 1 point or greater is considered a positive change.   Pulmonary Function Assessment:  Pulmonary Function Assessment - 06/21/23 1338       Breath   Shortness of Breath Yes;Limiting activity             Exercise Target Goals: Exercise Program Goal: Individual exercise prescription set using results from initial 6 min walk test and THRR while considering  patient's activity barriers and safety.   Exercise Prescription  Goal: Initial exercise prescription builds to 30-45 minutes a day of aerobic activity, 2-3 days per week.  Home exercise guidelines will be given to patient during program as part of exercise prescription that the participant will acknowledge.  Education: Aerobic Exercise: - Group verbal and visual presentation on the components of exercise prescription. Introduces F.I.T.T principle from ACSM for exercise prescriptions.  Reviews F.I.T.T. principles of aerobic exercise including progression. Written material given at graduation.   Education: Resistance Exercise: - Group verbal and visual presentation on the components of exercise prescription. Introduces F.I.T.T principle from ACSM for exercise prescriptions  Reviews F.I.T.T. principles of resistance exercise including progression. Written material given at graduation.    Education: Exercise & Equipment Safety: - Individual verbal instruction and demonstration of equipment use and safety with use of the equipment. Flowsheet Row Pulmonary Rehab from 08/17/2023 in [facility] Cardiac and Pulmonary Rehab  Date 06/28/23  Educator MB  Instruction Review Code 1- Verbalizes Understanding       Education: Exercise Physiology & General Exercise Guidelines: - Group verbal and written instruction with models to review the exercise physiology of the cardiovascular system and associated critical values. Provides general exercise guidelines with specific guidelines to those with heart or lung disease.    Education: Flexibility, Balance, Mind/Body Relaxation: - Group verbal and visual presentation with interactive activity on the components of exercise prescription. Introduces F.I.T.T principle from ACSM for exercise prescriptions.  Reviews F.I.T.T. principles of flexibility and balance exercise training including progression. Also discusses the mind body connection.  Reviews various relaxation techniques to help reduce and manage stress (i.e. Deep breathing,  progressive muscle relaxation, and visualization). Balance handout provided to take home. Written material given at graduation.   Activity Barriers & Risk Stratification:  Activity Barriers & Cardiac Risk Stratification - 06/28/23 1528       Activity Barriers & Cardiac Risk Stratification   Activity Barriers Back Problems;Shortness of Breath             6 Minute Walk:  6 Minute Walk     Row Name 06/28/23 1525         6 Minute Walk   Phase Initial     Distance 1040 feet     Walk Time 6 minutes     # of Rest Breaks 0     MPH 1.97     METS 1.77     RPE 13     Perceived Dyspnea  2     VO2 Peak 6.19     Symptoms Yes (comment)     Comments Bilateral hip pain     Resting HR 73 bpm     Resting BP 112/66     Resting Oxygen Saturation  94 %     Exercise Oxygen Saturation  during 6 min walk 91 %     Max Ex. HR 90 bpm     Max Ex. BP 126/66     2 Minute Post BP 110/66       Interval HR   1 Minute HR 67     2 Minute HR 89     3 Minute HR 90     4 Minute HR 90     5 Minute HR 90     6 Minute HR 89     2 Minute Post HR 66     Interval Heart Rate? Yes       Interval Oxygen   Interval Oxygen? Yes     Baseline Oxygen Saturation % 94 %     1 Minute Oxygen Saturation % 93 %     1 Minute Liters of Oxygen 0 L     2 Minute Oxygen Saturation % 91 %     2 Minute Liters of Oxygen 0 L     3 Minute Oxygen Saturation % 92 %     3 Minute Liters of Oxygen 0 L     4 Minute Oxygen Saturation % 92 %     4 Minute Liters of Oxygen 0 L     5 Minute Oxygen Saturation % 93 %     5 Minute Liters of Oxygen 0 L     6 Minute Oxygen Saturation % 95 %     6 Minute Liters of Oxygen 0 L     2 Minute Post Oxygen Saturation % 98 %     2 Minute Post Liters of Oxygen 0 L             Oxygen Initial Assessment:  Oxygen Initial Assessment - 06/21/23 1337       Home Oxygen   Home Oxygen Device None    Sleep Oxygen Prescription CPAP    Liters per minute 0    Home Exercise Oxygen  Prescription None    Home Resting Oxygen Prescription None    Compliance with Home Oxygen Use Yes      Initial 6 min Walk  Oxygen Used None      Program Oxygen Prescription   Program Oxygen Prescription None      Intervention   Short Term Goals To learn and exhibit compliance with exercise, home and travel O2 prescription;To learn and understand importance of monitoring SPO2 with pulse oximeter and demonstrate accurate use of the pulse oximeter.;To learn and understand importance of maintaining oxygen saturations>88%;To learn and demonstrate proper pursed lip breathing techniques or other breathing techniques. ;To learn and demonstrate proper use of respiratory medications    Long  Term Goals Exhibits compliance with exercise, home  and travel O2 prescription;Verbalizes importance of monitoring SPO2 with pulse oximeter and return demonstration;Maintenance of O2 saturations>88%;Exhibits proper breathing techniques, such as pursed lip breathing or other method taught during program session;Compliance with respiratory medication;Demonstrates proper use of MDI's             Oxygen Re-Evaluation:  Oxygen Re-Evaluation     Row Name 07/06/23 0942 07/25/23 0925 08/17/23 1040         Program Oxygen Prescription   Program Oxygen Prescription -- None None       Home Oxygen   Home Oxygen Device -- None None     Sleep Oxygen Prescription -- CPAP CPAP     Liters per minute -- 0 0     Home Exercise Oxygen Prescription -- None None     Home Resting Oxygen Prescription -- None None     Compliance with Home Oxygen Use -- Yes Yes       Goals/Expected Outcomes   Short Term Goals -- To learn and exhibit compliance with exercise, home and travel O2 prescription;To learn and understand importance of monitoring SPO2 with pulse oximeter and demonstrate accurate use of the pulse oximeter.;To learn and understand importance of maintaining oxygen saturations>88%;To learn and demonstrate proper pursed lip  breathing techniques or other breathing techniques. ;To learn and demonstrate proper use of respiratory medications To learn and exhibit compliance with exercise, home and travel O2 prescription;To learn and understand importance of monitoring SPO2 with pulse oximeter and demonstrate accurate use of the pulse oximeter.;To learn and understand importance of maintaining oxygen saturations>88%;To learn and demonstrate proper pursed lip breathing techniques or other breathing techniques. ;To learn and demonstrate proper use of respiratory medications     Long  Term Goals -- Exhibits compliance with exercise, home  and travel O2 prescription;Verbalizes importance of monitoring SPO2 with pulse oximeter and return demonstration;Maintenance of O2 saturations>88%;Exhibits proper breathing techniques, such as pursed lip breathing or other method taught during program session;Compliance with respiratory medication;Demonstrates proper use of MDI's Exhibits compliance with exercise, home  and travel O2 prescription;Verbalizes importance of monitoring SPO2 with pulse oximeter and return demonstration;Maintenance of O2 saturations>88%;Exhibits proper breathing techniques, such as pursed lip breathing or other method taught during program session;Compliance with respiratory medication;Demonstrates proper use of MDI's     Comments Reviewed PLB technique with pt.  Talked about how it works and it's importance in maintaining their exercise saturations. Reviewed PLB technique with her. SHowed her how it works and when to use it. Lottye is noticing improvement on her breathing since starting the program. She has been practicing her PLB with success. She attended the chronic lung disease review class today and enjoyed it. She reads articles on sarcoidosis attempting to find more information since it isn't discussed much. She plans to ask her doctor more questions at their next appt     Goals/Expected Outcomes Short: Become more  profiecient at using PLB.  Long: Become independent at using PLB. Short: practive using PLB when short of breath and looking to breath easier. Long: become independed at using PLB Short: attend pulmonary rehab for exercise and breathing techniques and ask her doctor her questions on her disease. Long: independenlty manage her breathing.              Oxygen Discharge (Final Oxygen Re-Evaluation):  Oxygen Re-Evaluation - 08/17/23 1040       Program Oxygen Prescription   Program Oxygen Prescription None      Home Oxygen   Home Oxygen Device None    Sleep Oxygen Prescription CPAP    Liters per minute 0    Home Exercise Oxygen Prescription None    Home Resting Oxygen Prescription None    Compliance with Home Oxygen Use Yes      Goals/Expected Outcomes   Short Term Goals To learn and exhibit compliance with exercise, home and travel O2 prescription;To learn and understand importance of monitoring SPO2 with pulse oximeter and demonstrate accurate use of the pulse oximeter.;To learn and understand importance of maintaining oxygen saturations>88%;To learn and demonstrate proper pursed lip breathing techniques or other breathing techniques. ;To learn and demonstrate proper use of respiratory medications    Long  Term Goals Exhibits compliance with exercise, home  and travel O2 prescription;Verbalizes importance of monitoring SPO2 with pulse oximeter and return demonstration;Maintenance of O2 saturations>88%;Exhibits proper breathing techniques, such as pursed lip breathing or other method taught during program session;Compliance with respiratory medication;Demonstrates proper use of MDI's    Comments Yanice is noticing improvement on her breathing since starting the program. She has been practicing her PLB with success. She attended the chronic lung disease review class today and enjoyed it. She reads articles on sarcoidosis attempting to find more information since it isn't discussed much. She plans  to ask her doctor more questions at their next appt    Goals/Expected Outcomes Short: attend pulmonary rehab for exercise and breathing techniques and ask her doctor her questions on her disease. Long: independenlty manage her breathing.             Initial Exercise Prescription:  Initial Exercise Prescription - 06/28/23 1500       Date of Initial Exercise RX and Referring Provider   Date 06/28/23    Referring Provider Vida Rigger, MD      Oxygen   Maintain Oxygen Saturation 88% or higher      Recumbant Bike   Level 1    RPM 50    Watts 15    Minutes 15    METs 1.77      NuStep   Level 1    SPM 80    Minutes 15    METs 1.77      Arm Ergometer   Level 1    RPM 30    Minutes 15    METs 1.77      Biostep-RELP   Level 1    SPM 50    Minutes 15    METs 1.77      Track   Laps 14    Minutes 15    METs 1.76      Prescription Details   Frequency (times per week) 3    Duration Progress to 30 minutes of continuous aerobic without signs/symptoms of physical distress      Intensity   THRR 40-80% of Max Heartrate 102-132    Ratings of Perceived Exertion 11-13    Perceived Dyspnea 0-4  Progression   Progression Continue to progress workloads to maintain intensity without signs/symptoms of physical distress.      Resistance Training   Training Prescription Yes    Weight 2 lb    Reps 10-15             Perform Capillary Blood Glucose checks as needed.  Exercise Prescription Changes:   Exercise Prescription Changes     Row Name 06/28/23 1500 07/20/23 1400 08/01/23 1400 08/10/23 1000 08/17/23 1600     Response to Exercise   Blood Pressure (Admit) 112/66 124/76 104/58 -- 108/60   Blood Pressure (Exercise) 126/66 124/70 144/72 -- --   Blood Pressure (Exit) 110/66 118/68 110/58 -- 108/62   Heart Rate (Admit) 73 bpm 64 bpm 71 bpm -- 61 bpm   Heart Rate (Exercise) 90 bpm 88 bpm 88 bpm -- 92 bpm   Heart Rate (Exit) 66 bpm 65 bpm 74 bpm -- 68 bpm    Oxygen Saturation (Admit) 94 % 95 % 95 % -- 94 %   Oxygen Saturation (Exercise) 91 % 91 % 90 % -- 91 %   Oxygen Saturation (Exit) 98 % 95 % 92 % -- 96 %   Rating of Perceived Exertion (Exercise) 13 13 15  -- 14   Perceived Dyspnea (Exercise) 2 2 3  -- 3   Symptoms bilateral hip pain none -- -- none   Comments results First three days of exercise -- -- --   Duration Progress to 30 minutes of  aerobic without signs/symptoms of physical distress Progress to 30 minutes of  aerobic without signs/symptoms of physical distress Continue with 30 min of aerobic exercise without signs/symptoms of physical distress. -- Continue with 30 min of aerobic exercise without signs/symptoms of physical distress.   Intensity THRR New THRR unchanged THRR unchanged -- THRR unchanged     Progression   Progression Continue to progress workloads to maintain intensity without signs/symptoms of physical distress. Continue to progress workloads to maintain intensity without signs/symptoms of physical distress. Continue to progress workloads to maintain intensity without signs/symptoms of physical distress. -- Continue to progress workloads to maintain intensity without signs/symptoms of physical distress.   Average METs 1.77 2.33 2.58 -- 2.71     Resistance Training   Training Prescription -- Yes Yes -- Yes   Weight -- 3 lb 3 lb -- 3 lb   Reps -- 10-15 10-15 -- 10-15     Interval Training   Interval Training -- No No -- No     Recumbant Bike   Level -- 1 2 -- 2   Watts -- 25 25 -- 25   Minutes -- 15 15 -- 15   METs -- 2.95 2.96 -- 2.96     NuStep   Level -- 2 3 -- 4   Minutes -- 30 15 -- 15   METs -- 2.2 2.5 -- 3     Arm Ergometer   Level -- -- 1 -- --   Minutes -- -- 15 -- --   METs -- -- 1.9 -- --     Biostep-RELP   Level -- -- 2 -- 3   Minutes -- -- 15 -- 15   METs -- -- 3 -- 2.8     Track   Laps -- 25 35 -- 15   Minutes -- 15 15 -- 15   METs -- 2.36 2.9 -- 1.82     Home Exercise Plan    Plans to continue exercise at -- -- --  Home (comment)  Recumbent at home, walking outside (weather permitting) Home (comment)  Recumbent at home, walking outside (weather permitting)   Frequency -- -- -- Add 2 additional days to program exercise sessions. Add 2 additional days to program exercise sessions.   Initial Home Exercises Provided -- -- -- 08/10/23 08/10/23     Oxygen   Maintain Oxygen Saturation -- 88% or higher 88% or higher 88% or higher 88% or higher            Exercise Comments:   Exercise Comments     Row Name 07/06/23 2725           Exercise Comments First full day of exercise!  Patient was oriented to gym and equipment including functions, settings, policies, and procedures.  Patient's individual exercise prescription and treatment plan were reviewed.  All starting workloads were established based on the results of the 6 minute walk test done at initial orientation visit.  The plan for exercise progression was also introduced and progression will be customized based on patient's performance and goals.                Exercise Goals and Review:   Exercise Goals     Row Name 06/28/23 1536             Exercise Goals   Increase Physical Activity Yes       Intervention Provide advice, education, support and counseling about physical activity/exercise needs.;Develop an individualized exercise prescription for aerobic and resistive training based on initial evaluation findings, risk stratification, comorbidities and participant's personal goals.       Expected Outcomes Short Term: Attend rehab on a regular basis to increase amount of physical activity.;Long Term: Add in home exercise to make exercise part of routine and to increase amount of physical activity.;Long Term: Exercising regularly at least 3-5 days a week.       Increase Strength and Stamina Yes       Intervention Provide advice, education, support and counseling about physical activity/exercise  needs.;Develop an individualized exercise prescription for aerobic and resistive training based on initial evaluation findings, risk stratification, comorbidities and participant's personal goals.       Expected Outcomes Short Term: Increase workloads from initial exercise prescription for resistance, speed, and METs.;Short Term: Perform resistance training exercises routinely during rehab and add in resistance training at home;Long Term: Improve cardiorespiratory fitness, muscular endurance and strength as measured by increased METs and functional capacity ( )       Able to understand and use rate of perceived exertion (RPE) scale Yes       Intervention Provide education and explanation on how to use RPE scale       Expected Outcomes Short Term: Able to use RPE daily in rehab to express subjective intensity level;Long Term:  Able to use RPE to guide intensity level when exercising independently       Able to understand and use Dyspnea scale Yes       Intervention Provide education and explanation on how to use Dyspnea scale       Expected Outcomes Short Term: Able to use Dyspnea scale daily in rehab to express subjective sense of shortness of breath during exertion;Long Term: Able to use Dyspnea scale to guide intensity level when exercising independently       Knowledge and understanding of Target Heart Rate Range (THRR) Yes       Intervention Provide education and explanation of THRR including how the numbers were  predicted and where they are located for reference       Expected Outcomes Short Term: Able to state/look up THRR;Long Term: Able to use THRR to govern intensity when exercising independently;Short Term: Able to use daily as guideline for intensity in rehab       Able to check pulse independently Yes       Intervention Provide education and demonstration on how to check pulse in carotid and radial arteries.;Review the importance of being able to check your own pulse for safety during  independent exercise       Expected Outcomes Short Term: Able to explain why pulse checking is important during independent exercise;Long Term: Able to check pulse independently and accurately       Understanding of Exercise Prescription Yes       Intervention Provide education, explanation, and written materials on patient's individual exercise prescription       Expected Outcomes Short Term: Able to explain program exercise prescription;Long Term: Able to explain home exercise prescription to exercise independently                Exercise Goals Re-Evaluation :  Exercise Goals Re-Evaluation     Row Name 07/06/23 0937 07/20/23 1500 07/25/23 0919 08/01/23 1500 08/10/23 1010     Exercise Goal Re-Evaluation   Exercise Goals Review Able to understand and use rate of perceived exertion (RPE) scale;Able to understand and use Dyspnea scale;Knowledge and understanding of Target Heart Rate Range (THRR);Understanding of Exercise Prescription Increase Physical Activity;Increase Strength and Stamina;Understanding of Exercise Prescription Increase Physical Activity;Increase Strength and Stamina;Understanding of Exercise Prescription Increase Physical Activity;Increase Strength and Stamina;Understanding of Exercise Prescription Increase Physical Activity;Increase Strength and Stamina;Understanding of Exercise Prescription;Able to understand and use Dyspnea scale;Knowledge and understanding of Target Heart Rate Range (THRR);Able to understand and use rate of perceived exertion (RPE) scale;Able to check pulse independently   Comments Reviewed RPE and dyspnea scale, THR and program prescription with pt today.  Pt voiced understanding and was given a copy of goals to take home. Reylee is off to a good start in the program. She was able to walk up to 25 laps on the track and work at level 1 on the recumbent bike. She also improved to level 2 on the T4 nustep and increased to 3 lb hand weights for resistance  training. We will continue to monitor her progress in the program. She is doing well with exercise at rehab, currently on level 2 with the T4. was able to get 30laps walking the track. Is still doing well with 3lbs with hand weight. She is not exercising much at home, walking to the mailbox and back, ~1/4 mile daily. Encouraged her to walk and look for exercises she can do at home, to speak with exercise team here at rehab if she has questions. Arisa continues to do well in rehab. She has increased her level to 3 on the T4 nustep and increased her laps to 35 on the track. She has also increased her level to 2 on the recumbent bike and biostep. We will continue to monitor her progress in the program. Reviewed home exercise with pt today from 09:20 to 09:34.  Pt plans to use recumbent bike at home and walk outside on days away from rehab for exercise.  Reviewed THR, pulse, RPE, sign and symptoms, pulse oximetery and when to call 911 or MD.  Also discussed weather considerations and indoor options.  Pt voiced understanding.   Expected Outcomes Short: Use  RPE daily to regulate intensity. Long: Follow program prescription in THR. Short: Continue to follow current exercise prescription. Long: Continue exercise to improve strength and stamina. Short: Continue to follow current exercise at rehab and at home. Long: Continue exercise and improve strength and stamina Short: Continue to progressively increase workloads with the nustep, recumbent bike, biostep, and track. Long: Continue exercise to improve strength and stamina. Short: Begin using recumbent bike at home on days away from rehab. Long: Continue to exercise independently.    Row Name 08/17/23 1656             Exercise Goal Re-Evaluation   Exercise Goals Review Increase Physical Activity;Increase Strength and Stamina;Understanding of Exercise Prescription       Comments Mliss is doing well in rehab. She was able to increase her level on the T4 nustep  from level 3 to 4. She was also able to increase her level on the biostep from level 2 to 3. We will continue to monitor her progress in the program.       Expected Outcomes Short: Try level 3 on the recumbent bike. Long: Continue to exercise to improve strength and stamina.                Discharge Exercise Prescription (Final Exercise Prescription Changes):  Exercise Prescription Changes - 08/17/23 1600       Response to Exercise   Blood Pressure (Admit) 108/60    Blood Pressure (Exit) 108/62    Heart Rate (Admit) 61 bpm    Heart Rate (Exercise) 92 bpm    Heart Rate (Exit) 68 bpm    Oxygen Saturation (Admit) 94 %    Oxygen Saturation (Exercise) 91 %    Oxygen Saturation (Exit) 96 %    Rating of Perceived Exertion (Exercise) 14    Perceived Dyspnea (Exercise) 3    Symptoms none    Duration Continue with 30 min of aerobic exercise without signs/symptoms of physical distress.    Intensity THRR unchanged      Progression   Progression Continue to progress workloads to maintain intensity without signs/symptoms of physical distress.    Average METs 2.71      Resistance Training   Training Prescription Yes    Weight 3 lb    Reps 10-15      Interval Training   Interval Training No      Recumbant Bike   Level 2    Watts 25    Minutes 15    METs 2.96      NuStep   Level 4    Minutes 15    METs 3      Biostep-RELP   Level 3    Minutes 15    METs 2.8      Track   Laps 15    Minutes 15    METs 1.82      Home Exercise Plan   Plans to continue exercise at Home (comment)   Recumbent at home, walking outside (weather permitting)   Frequency Add 2 additional days to program exercise sessions.    Initial Home Exercises Provided 08/10/23      Oxygen   Maintain Oxygen Saturation 88% or higher             Nutrition:  Target Goals: Understanding of nutrition guidelines, daily intake of sodium 1500mg , cholesterol 200mg , calories 30% from fat and 7% or less from  saturated fats, daily to have 5 or more servings of fruits and vegetables.  Education: All About Nutrition: -Group instruction provided by verbal, written material, interactive activities, discussions, models, and posters to present general guidelines for heart healthy nutrition including fat, fiber, MyPlate, the role of sodium in heart healthy nutrition, utilization of the nutrition label, and utilization of this knowledge for meal planning. Follow up email sent as well. Written material given at graduation.   Biometrics:  Pre Biometrics - 06/28/23 1537       Pre Biometrics   Height 5' 2.1" (1.577 m)    Weight 182 lb 3.2 oz (82.6 kg)    Waist Circumference 43 inches    Hip Circumference 45.5 inches    Waist to Hip Ratio 0.95 %    BMI (Calculated) 33.23    Single Leg Stand 12 seconds              Nutrition Therapy Plan and Nutrition Goals:  Nutrition Therapy & Goals - 06/28/23 1602       Nutrition Therapy   Diet Cardiac, Low Na    Protein (specify units) 75    Fiber 25 grams    Whole Grain Foods 3 servings    Saturated Fats 15 max. grams    Fruits and Vegetables 5 servings/day    Sodium 2 grams      Personal Nutrition Goals   Nutrition Goal Drink ~3 bottles of water per day    Personal Goal #2 Eat a protein and carb at each meal    Personal Goal #3 Use small nutrient dense snacks when not very hungry    Comments Patient drinking 32oz of water daily, spoke about increasing to 48oz. She says sweets are her biggest struggle. Recommended some healthier sweet treat ideas to try. Encouraged her to drink some water before snacking. She reports she is trying to lose weight, doesn't eat a lot but still struggling to lose the weight. Spoke to her about nutrient and calorie density, encouraged her to eat smaller but more nutrient dense foods to help her meet her nutrient goals like protein, fiber, and calories. Reviewed mediterranean diet handout, education on types of fats, sources,  and how to read label. Built out several meals and snacks with foods she likes and will eat.      Intervention Plan   Intervention Prescribe, educate and counsel regarding individualized specific dietary modifications aiming towards targeted core components such as weight, hypertension, lipid management, diabetes, heart failure and other comorbidities.;Nutrition handout(s) given to patient.    Expected Outcomes Short Term Goal: Understand basic principles of dietary content, such as calories, fat, sodium, cholesterol and nutrients.;Short Term Goal: A plan has been developed with personal nutrition goals set during dietitian appointment.;Long Term Goal: Adherence to prescribed nutrition plan.             Nutrition Assessments:  MEDIFICTS Score Key: >=70 Need to make dietary changes  40-70 Heart Healthy Diet <= 40 Therapeutic Level Cholesterol Diet  Flowsheet Row Pulmonary Rehab from 06/28/2023 in [facility]  Picture Your Plate Total Score on Admission 72      Picture Your Plate Scores: <78 Unhealthy dietary pattern with much room for improvement. 41-50 Dietary pattern unlikely to meet recommendations for good health and room for improvement. 51-60 More healthful dietary pattern, with some room for improvement.  >60 Healthy dietary pattern, although there may be some specific behaviors that could be improved.   Nutrition Goals Re-Evaluation:  Nutrition Goals Re-Evaluation     Row Name 07/25/23 [phone] 08/17/23 1046  Goals   Nutrition Goal Drink 3 bottles of water per day --      Comment She isnt drinking as much water as she should be. Spoke with her baout the barriers to this goals, concluded together that she is likely forgetting. Suggested she set up a few alarms on her phone to remind her. She agrees that would be a good idea as she uses alarms for other things in her daily routine. Also spoke with her about eating adequate protein and carbs to  support energy and increased needs. She is doing that most meals but sometimes doesnt feel hungry and will miss dinner or use a snack instead. Demelza is still trying to increase her water intake. She is carrying her water around more which is an improvement. She has been using alarms to help remind herself. Her appetite is about the same and she notes she has been extra busy during this holiday season.      Expected Outcome Short: Drink 3 bottles of water and dont miss meals. Long: Maintain healthy hydration and eating habits Short: set reminders for water and healthy eating during this busy holiday season. Long:  independently manage healthy eating habits               Nutrition Goals Discharge (Final Nutrition Goals Re-Evaluation):  Nutrition Goals Re-Evaluation - 08/17/23 1046       Goals   Comment Urmila is still trying to increase her water intake. She is carrying her water around more which is an improvement. She has been using alarms to help remind herself. Her appetite is about the same and she notes she has been extra busy during this holiday season.    Expected Outcome Short: set reminders for water and healthy eating during this busy holiday season. Long:  independently manage healthy eating habits             Psychosocial: Target Goals: Acknowledge presence or absence of significant depression and/or stress, maximize coping skills, provide positive support system. Participant is able to verbalize types and ability to use techniques and skills needed for reducing stress and depression.   Education: Stress, Anxiety, and Depression - Group verbal and visual presentation to define topics covered.  Reviews how body is impacted by stress, anxiety, and depression.  Also discusses healthy ways to reduce stress and to treat/manage anxiety and depression.  Written material given at graduation.   Education: Sleep Hygiene -Provides group verbal and written instruction about how sleep  can affect your health.  Define sleep hygiene, discuss sleep cycles and impact of sleep habits. Review good sleep hygiene tips.    Initial Review & Psychosocial Screening:  Initial Psych Review & Screening - 06/21/23 1339       Initial Review   Current issues with Current Anxiety/Panic      Family Dynamics   Good Support System? Yes    Comments Her husband has some dementia and other health issues. She can call her daughters if she gets anxiety she has some medication for it.      Barriers   Psychosocial barriers to participate in program The patient should benefit from training in stress management and relaxation.      Screening Interventions   Interventions Encouraged to exercise;To provide support and resources with identified psychosocial needs;Provide feedback about the scores to participant    Expected Outcomes Short Term goal: Utilizing psychosocial counselor, staff and physician to assist with identification of specific Stressors or current issues interfering with  healing process. Setting desired goal for each stressor or current issue identified.;Long Term Goal: Stressors or current issues are controlled or eliminated.;Short Term goal: Identification and review with participant of any Quality of Life or Depression concerns found by scoring the questionnaire.;Long Term goal: The participant improves quality of Life and PHQ9 Scores as seen by post scores and/or verbalization of changes             Quality of Life Scores:  Scores of 19 and below usually indicate a poorer quality of life in these areas.  A difference of  2-3 points is a clinically meaningful difference.  A difference of 2-3 points in the total score of the Quality of Life Index has been associated with significant improvement in overall quality of life, self-image, physical symptoms, and general health in studies assessing change in quality of life.  PHQ-9: Review Flowsheet       06/28/2023  Depression screen  PHQ 2/9  Decreased Interest 0  Down, Depressed, Hopeless 0  PHQ - 2 Score 0  Altered sleeping 0  Tired, decreased energy 1  Change in appetite 1  Feeling bad or failure about yourself  1  Trouble concentrating 0  Moving slowly or fidgety/restless 0  Suicidal thoughts 0  PHQ-9 Score 3   Interpretation of Total Score  Total Score Depression Severity:  1-4 = Minimal depression, 5-9 = Mild depression, 10-14 = Moderate depression, 15-19 = Moderately severe depression, 20-27 = Severe depression   Psychosocial Evaluation and Intervention:  Psychosocial Evaluation - 06/21/23 1342       Psychosocial Evaluation & Interventions   Interventions Encouraged to exercise with the program and follow exercise prescription;Relaxation education;Stress management education    Comments Her husband has some dementia and other health issues. She can call her daughters if she gets anxiety she has some medication for it.    Expected Outcomes Short: Start [facility] to help with mood. Long: Maintain a healthy mental state.    Continue Psychosocial Services  Follow up required by staff             Psychosocial Re-Evaluation:  Psychosocial Re-Evaluation     Row Name 07/25/23 0930 08/17/23 1035           Psychosocial Re-Evaluation   Current issues with None Identified Current Stress Concerns      Comments She is not dealing with any drepssion or anxiety stressors. She reports she has good social support and relies in prayer and friends. holidays are busy and she needs to get the house ready. Malley is happy that she can notice some results in the 16 sessions she has been here. She notes her shortness of breath is not as severe doing certain activities. She mentions a some of her family relies on her for transportation and to keep things organized during the holidays. She does have a great support system, but sometimes it weight on her. Her husband's dementia is stable for now and they are looking forward  to holiday events because he enjoys eating with friends and makes him happy. 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 She finds comfort in her church and family      Expected Outcomes short: stay focus and postive during holidays.Long: stay positive and use social support Short: attend pulmonary rehab for education on stress reduction. Long: maintain positive self care habits      Interventions Encouraged to attend Pulmonary Rehabilitation for the exercise --  Continue Psychosocial Services  Follow up required by staff Follow up required by staff               Psychosocial Discharge (Final Psychosocial Re-Evaluation):  Psychosocial Re-Evaluation - 08/17/23 1035       Psychosocial Re-Evaluation   Current issues with Current Stress Concerns    Comments Tyriana is happy that she can notice some results in the 16 sessions she has been here. She notes her shortness of breath is not as severe doing certain activities. She mentions a some of her family relies on her for transportation and to keep things organized during the holidays. She does have a great support system, but sometimes it weight on her. Her husband's dementia is stable for now and they are looking forward to holiday events because he enjoys eating with friends and makes him happy. 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 She finds comfort in her church and family    Expected Outcomes Short: attend pulmonary rehab for education on stress reduction. Long: maintain positive self care habits    Continue Psychosocial Services  Follow up required by staff             Education: Education Goals: Education classes will be provided on a weekly basis, covering required topics. Participant will state understanding/return demonstration of topics presented.  Learning Barriers/Preferences:  Learning Barriers/Preferences - 06/21/23 1338       Learning Barriers/Preferences   Learning  Barriers Hearing    Learning Preferences None             General Pulmonary Education Topics:  Infection Prevention: - Provides verbal and written material to individual with discussion of infection control including proper hand washing and proper equipment cleaning during exercise session. Flowsheet Row Pulmonary Rehab from 08/17/2023 in [facility]  Date 06/28/23  Educator MB  Instruction Review Code 1- Verbalizes Understanding       Falls Prevention: - Provides verbal and written material to individual with discussion of falls prevention and safety. Flowsheet Row Pulmonary Rehab from 08/17/2023 in [facility]  Date 06/28/23  Educator MB  Instruction Review Code 1- Verbalizes Understanding       Chronic Lung Disease Review: - Group verbal instruction with posters, models, PowerPoint presentations and videos,  to review new updates, new respiratory medications, new advancements in procedures and treatments. Providing information on websites and "800" numbers for continued self-education. Includes information about supplement oxygen, available portable oxygen systems, continuous and intermittent flow rates, oxygen safety, concentrators, and Medicare reimbursement for oxygen. Explanation of Pulmonary Drugs, including class, frequency, complications, importance of spacers, rinsing mouth after steroid MDI's, and proper cleaning methods for nebulizers. Review of basic lung anatomy and physiology related to function, structure, and complications of lung disease. Review of risk factors. Discussion about methods for diagnosing sleep apnea and types of masks and machines for OSA. Includes a review of the use of types of environmental controls: home humidity, furnaces, filters, dust mite/pet prevention, HEPA vacuums. Discussion about weather changes, air quality and the benefits of nasal washing. Instruction on Warning signs, infection symptoms,  calling MD promptly, preventive modes, and value of vaccinations. Review of effective airway clearance, coughing and/or vibration techniques. Emphasizing that all should Create an Action Plan. Written material given at graduation. Flowsheet Row Pulmonary Rehab from 08/17/2023 in [facility]  Education need identified 06/28/23  Date 08/17/23  Educator [facility]  Instruction Review Code 1- Verbalizes Understanding       AED/CPR: - Group verbal and written instruction with the use of models to demonstrate the basic use of the AED with the basic ABC's of resuscitation.    Anatomy and Cardiac Procedures: - Group verbal and visual presentation and models provide information about basic cardiac anatomy and function. Reviews the testing methods done to diagnose heart disease and the outcomes of the test results. Describes the treatment choices: Medical Management, Angioplasty, or Coronary Bypass Surgery for treating various heart conditions including Myocardial Infarction, Angina, Valve Disease, and Cardiac Arrhythmias.  Written material given at graduation.   Medication Safety: - Group verbal and visual instruction to review commonly prescribed medications for heart and lung disease. Reviews the medication, class of the drug, and side effects. Includes the steps to properly store meds and maintain the prescription regimen.  Written material given at graduation.   Other: -Provides group and verbal instruction on various topics (see comments)   Knowledge Questionnaire Score:  Knowledge Questionnaire Score - 06/28/23 1554       Knowledge Questionnaire Score   Pre Score 16/18              Core Components/Risk Factors/Patient Goals at Admission:  Personal Goals and Risk Factors at Admission - 06/28/23 1539       Core Components/Risk Factors/Patient Goals on Admission    Weight Management Weight Loss;Yes    Intervention Weight Management: Develop a combined nutrition  and exercise program designed to reach desired caloric intake, while maintaining appropriate intake of nutrient and fiber, sodium and fats, and appropriate energy expenditure required for the weight goal.;Weight Management: Provide education and appropriate resources to help participant work on and attain dietary goals.;Weight Management/Obesity: Establish reasonable short term and long term weight goals.    Admit Weight 182 lb 3.2 oz (82.6 kg)    Goal Weight: Short Term 172 lb 3.2 oz (78.1 kg)    Goal Weight: Long Term 162 lb 3.2 oz (73.6 kg)    Expected Outcomes Short Term: Continue to assess and modify interventions until short term weight is achieved;Long Term: Adherence to nutrition and physical activity/exercise program aimed toward attainment of established weight goal;Weight Loss: Understanding of general recommendations for a balanced deficit meal plan, which promotes 1-2 lb weight loss per week and includes a negative energy balance of [phone] kcal/d;Understanding recommendations for meals to include 15-35% energy as protein, 25-35% energy from fat, 35-60% energy from carbohydrates, less than 200mg  of dietary cholesterol, 20-35 gm of total fiber daily;Understanding of distribution of calorie intake throughout the day with the consumption of 4-5 meals/snacks    Improve shortness of breath with ADL's Yes    Intervention Provide education, individualized exercise plan and daily activity instruction to help decrease symptoms of SOB with activities of daily living.    Expected Outcomes Long Term: Be able to perform more ADLs without symptoms or delay the onset of symptoms;Short Term: Improve cardiorespiratory fitness to achieve a reduction of symptoms when performing ADLs    Hypertension Yes    Intervention Provide education on lifestyle modifcations including regular physical activity/exercise, weight management, moderate sodium restriction and increased consumption of fresh fruit, vegetables, and  low fat dairy, alcohol moderation, and smoking cessation.;Monitor prescription use compliance.    Expected Outcomes Short Term: Continued assessment and intervention until BP is < 140/6mm HG in hypertensive participants. < 130/64mm HG in hypertensive participants with diabetes, heart failure or chronic kidney disease.;Long Term: Maintenance of blood  pressure at goal levels.    Lipids Yes    Intervention Provide education and support for participant on nutrition & aerobic/resistive exercise along with prescribed medications to achieve LDL 70mg , HDL >40mg .    Expected Outcomes Short Term: Participant states understanding of desired cholesterol values and is compliant with medications prescribed. Participant is following exercise prescription and nutrition guidelines.;Long Term: Cholesterol controlled with medications as prescribed, with individualized exercise RX and with personalized nutrition plan. Value goals: LDL < 70mg , HDL > 40 mg.             Education:Diabetes - Individual verbal and written instruction to review signs/symptoms of diabetes, desired ranges of glucose level fasting, after meals and with exercise. Acknowledge that pre and post exercise glucose checks will be done for 3 sessions at entry of program.   Know Your Numbers and Heart Failure: - Group verbal and visual instruction to discuss disease risk factors for cardiac and pulmonary disease and treatment options.  Reviews associated critical values for Overweight/Obesity, Hypertension, Cholesterol, and Diabetes.  Discusses basics of heart failure: signs/symptoms and treatments.  Introduces Heart Failure Zone chart for action plan for heart failure.  Written material given at graduation.   Core Components/Risk Factors/Patient Goals Review:   Goals and Risk Factor Review     Row Name 07/25/23 0935 08/17/23 1031           Core Components/Risk Factors/Patient Goals Review   Personal Goals Review Weight Management/Obesity  Improve shortness of breath with ADL's;Hypertension      Review Patient is eating better snacks, working on drinking more water, choosing healthy foods but is frustrated at not losing weight. Spoke with her about the importance of not missing meals as she sometimes does, how low caloire foods are often healthy but can make it hard to meet caloire goals when not eaten in larger quantities. Educated her on the use of healthy fats for calorie dense options. Tierany has noticed an improvement in her breathing since starting the program. She notes that it is easier to go up and down the bleachers at her grandson's basketball game and to keep the same pace as her husband. Her blood pressure has been stable and she saw her primary care doctor this week and was given the all good to keep taking her current medication. He did add metformin because she is becoming insulin resistant so they are trying to get ahead of it by medication and lifestyle management.      Expected Outcomes Short: do not miss meals, use healthy fats to meet caloire needs for weight loss. Long: Maintain healthy eating habits to support weight loss Short: attend pulmonary rehab for supervised exercise education and take her metformin  Long: independently manage risk factors               Core Components/Risk Factors/Patient Goals at Discharge (Final Review):   Goals and Risk Factor Review - 08/17/23 1031       Core Components/Risk Factors/Patient Goals Review   Personal Goals Review Improve shortness of breath with ADL's;Hypertension    Review Rebecka has noticed an improvement in her breathing since starting the program. She notes that it is easier to go up and down the bleachers at her grandson's basketball game and to keep the same pace as her husband. Her blood pressure has been stable and she saw her primary care doctor this week and was given the all good to keep taking her current medication. He did add metformin because  she is  becoming insulin resistant so they are trying to get ahead of it by medication and lifestyle management.    Expected Outcomes Short: attend pulmonary rehab for supervised exercise education and take her metformin  Long: independently manage risk factors             ITP Comments:  ITP Comments     Row Name 06/21/23 1336 06/28/23 1556 07/05/23 0834 07/06/23 0937 07/26/23 1123   ITP Comments Virtual Visit completed. Patient informed on EP and RD appointment and 6 Minute walk test. Patient also informed of patient health questionnaires on My Chart. Patient Verbalizes understanding. Visit diagnosis can be found in [facility]. Completed and gym orientation. Initial ITP created and sent for review to Dr. Jinny Sanders, Medical Director. 30 Day review completed. Medical Director ITP review done, changes made as directed, and signed approval by Medical Director.    new to program First full day of exercise!  Patient was oriented to gym and equipment including functions, settings, policies, and procedures.  Patient's individual exercise prescription and treatment plan were reviewed.  All starting workloads were established based on the results of the 6 minute walk test done at initial orientation visit.  The plan for exercise progression was also introduced and progression will be customized based on patient's performance and goals. 30 Day review completed. Medical Director ITP review done, changes made as directed, and signed approval by Medical Director.    new to program    Row Name 08/23/23 0937           ITP Comments 30 Day review completed. Medical Director ITP review done, changes made as directed, and signed approval by Medical Director.                Comments:

## 2023-08-24 ENCOUNTER — Encounter: Payer: Medicare Other | Admitting: *Deleted

## 2023-08-24 DIAGNOSIS — D869 Sarcoidosis, unspecified: Secondary | ICD-10-CM

## 2023-08-24 DIAGNOSIS — D860 Sarcoidosis of lung: Secondary | ICD-10-CM | POA: Diagnosis not present

## 2023-08-24 NOTE — Progress Notes (Signed)
Daily Session Note  Patient Details  Name: Megan Daniels MRN: [id number] Date of Birth: [birth year] Referring Provider:   Flowsheet Row Pulmonary Rehab from 06/28/2023 in [facility] Cardiac and Pulmonary Rehab  Referring Provider Vida Rigger, MD       Encounter Date: 08/24/2023  Check In:  Session Check In - 08/24/23 0928       Check-In   Supervising physician immediately available to respond to emergencies See telemetry face sheet for immediately available ER MD    Location [facility]-Cardiac & Pulmonary Rehab    Staff Present Ronette Deter, BS, Exercise Physiologist;Joseph Reino Kent, Guinevere Ferrari, RN, ADN;Maxon [employer], , Exercise Physiologist    Virtual Visit No    Medication changes reported     No    Fall or balance concerns reported    No    Warm-up and Cool-down Performed on first and last piece of equipment    Resistance Training Performed Yes    VAD Patient? No    PAD/SET Patient? No      Pain Assessment   Currently in Pain? No/denies                Social History   Tobacco Use  Smoking Status Never  Smokeless Tobacco Never    Goals Met:  Independence with exercise equipment Exercise tolerated well No report of concerns or symptoms today Strength training completed today  Goals Unmet:  Not Applicable  Comments: Pt able to follow exercise prescription today without complaint.  Will continue to monitor for progression.    Dr. Bethann Punches is Medical Director for [facility] Cardiac Rehabilitation.  Dr. Vida Rigger is Medical Director for [facility] Pulmonary Rehabilitation.

## 2023-09-05 ENCOUNTER — Encounter: Payer: Medicare Other | Admitting: *Deleted

## 2023-09-05 DIAGNOSIS — D869 Sarcoidosis, unspecified: Secondary | ICD-10-CM

## 2023-09-05 DIAGNOSIS — D860 Sarcoidosis of lung: Secondary | ICD-10-CM | POA: Diagnosis not present

## 2023-09-05 NOTE — Progress Notes (Signed)
 Daily Session Note  Patient Details  Name: Megan Daniels MRN: [id number] Date of Birth: 01-27-1951 Referring Provider:   Flowsheet Row Pulmonary Rehab from 06/28/2023 in [facility] Cardiac and Pulmonary Rehab  Referring Provider Parris Manna, MD       Encounter Date: 09/05/2023  Check In:  Session Check In - 09/05/23 0939       Check-In   Supervising physician immediately available to respond to emergencies See telemetry face sheet for immediately available ER MD    Location [facility]-Cardiac & Pulmonary Rehab    Staff Present Othel Durand, RN, BSN, CCRP;Margaret Best, MS, Exercise Physiologist;Maxon Conetta BS, , Exercise Physiologist;Noah Tickle, BS, Exercise Physiologist    Virtual Visit No    Medication changes reported     No    Fall or balance concerns reported    No    Warm-up and Cool-down Performed on first and last piece of equipment    Resistance Training Performed Yes    VAD Patient? No    PAD/SET Patient? No      Pain Assessment   Currently in Pain? No/denies                Social History   Tobacco Use  Smoking Status Never  Smokeless Tobacco Never    Goals Met:  Proper associated with RPD/PD & O2 Sat Independence with exercise equipment Exercise tolerated well No report of concerns or symptoms today  Goals Unmet:  Not Applicable  Comments: Pt able to follow exercise prescription today without complaint.  Will continue to monitor for progression.    Dr. Oneil Pinal is Medical Director for [facility] Cardiac Rehabilitation.  Dr. Fuad Aleskerov is Medical Director for [facility] Pulmonary Rehabilitation.

## 2023-09-08 ENCOUNTER — Encounter: Payer: Medicare Other | Attending: Pulmonary Disease | Admitting: *Deleted

## 2023-09-08 DIAGNOSIS — D869 Sarcoidosis, unspecified: Secondary | ICD-10-CM | POA: Diagnosis present

## 2023-09-08 DIAGNOSIS — R0609 Other forms of dyspnea: Secondary | ICD-10-CM | POA: Insufficient documentation

## 2023-09-08 NOTE — Progress Notes (Signed)
 Daily Session Note  Patient Details  Name: JEENA ARNETT MRN: [id number] Date of Birth: [birth year] Referring Provider:   Flowsheet Row Pulmonary Rehab from 06/28/2023 in [facility] Cardiac and Pulmonary Rehab  Referring Provider Parris Manna, MD       Encounter Date: 09/08/2023  Check In:  Session Check In - 09/08/23 1116       Check-In   Supervising physician immediately available to respond to emergencies See telemetry face sheet for immediately available ER MD    Location [facility]-Cardiac & Pulmonary Rehab    Staff Present Othel Durand, RN, BSN, CCRP;Noah Tickle, BS, Exercise Physiologist;Maxon Conetta BS, , Exercise Physiologist    Virtual Visit No    Medication changes reported     No    Fall or balance concerns reported    No    Warm-up and Cool-down Performed on first and last piece of equipment    Resistance Training Performed Yes    VAD Patient? No    PAD/SET Patient? No      Pain Assessment   Currently in Pain? No/denies                Social History   Tobacco Use  Smoking Status Never  Smokeless Tobacco Never    Goals Met:  Proper associated with RPD/PD & O2 Sat Independence with exercise equipment Exercise tolerated well No report of concerns or symptoms today  Goals Unmet:  Not Applicable  Comments: Pt able to follow exercise prescription today without complaint.  Will continue to monitor for progression.    Dr. Oneil Pinal is Medical Director for [facility] Cardiac Rehabilitation.  Dr. Fuad Aleskerov is Medical Director for [facility] Pulmonary Rehabilitation.

## 2023-09-14 ENCOUNTER — Encounter: Payer: Medicare Other | Admitting: *Deleted

## 2023-09-14 DIAGNOSIS — D869 Sarcoidosis, unspecified: Secondary | ICD-10-CM

## 2023-09-14 NOTE — Progress Notes (Signed)
 Daily Session Note  Patient Details  Name: Megan Daniels MRN: [id number] Date of Birth: [birth year] Referring Provider:   Flowsheet Row Pulmonary Rehab from 06/28/2023 in [facility] Cardiac and Pulmonary Rehab  Referring Provider Parris Manna, MD       Encounter Date: 09/14/2023  Check In:  Session Check In - 09/14/23 0931       Check-In   Supervising physician immediately available to respond to emergencies See telemetry face sheet for immediately available ER MD    Location [facility]-Cardiac & Pulmonary Rehab    Staff Present Othel Durand, RN, BSN, CCRP;Joseph Hood, RCP,RRT,BSRT;Maxon Baker BS, , Exercise Physiologist;Shonte Soderlund Claudene, RN, [place]    Virtual Visit No    Medication changes reported     No    Fall or balance concerns reported    No    Warm-up and Cool-down Performed on first and last piece of equipment    Resistance Training Performed Yes    VAD Patient? No    PAD/SET Patient? No      Pain Assessment   Currently in Pain? No/denies                Social History   Tobacco Use  Smoking Status Never  Smokeless Tobacco Never    Goals Met:  Independence with exercise equipment Exercise tolerated well No report of concerns or symptoms today Strength training completed today  Goals Unmet:  Not Applicable  Comments: Pt able to follow exercise prescription today without complaint.  Will continue to monitor for progression.    Dr. Oneil Pinal is Medical Director for [facility] Cardiac Rehabilitation.  Dr. Fuad Aleskerov is Medical Director for [facility] Pulmonary Rehabilitation.

## 2023-09-15 ENCOUNTER — Encounter: Payer: Medicare Other | Admitting: *Deleted

## 2023-09-15 DIAGNOSIS — D869 Sarcoidosis, unspecified: Secondary | ICD-10-CM | POA: Diagnosis not present

## 2023-09-15 NOTE — Progress Notes (Signed)
 Daily Session Note  Patient Details  Name: DMIYAH LISCANO MRN: [id number] Date of Birth: [birth year] Referring Provider:   Flowsheet Row Pulmonary Rehab from 06/28/2023 in [facility] Cardiac and Pulmonary Rehab  Referring Provider Parris Manna, MD       Encounter Date: 09/15/2023  Check In:  Session Check In - 09/15/23 0944       Check-In   Supervising physician immediately available to respond to emergencies See telemetry face sheet for immediately available ER MD    Location [facility]-Cardiac & Pulmonary Rehab    Staff Present Othel Durand, RN, BSN, CCRP;Noah Tickle, BS, Exercise Physiologist;Joseph [place], [place]    Virtual Visit No    Medication changes reported     No    Fall or balance concerns reported    No    Warm-up and Cool-down Performed on first and last piece of equipment    Resistance Training Performed Yes    VAD Patient? No    PAD/SET Patient? No      Pain Assessment   Currently in Pain? No/denies                Social History   Tobacco Use  Smoking Status Never  Smokeless Tobacco Never    Goals Met:  Proper associated with RPD/PD & O2 Sat Independence with exercise equipment Exercise tolerated well No report of concerns or symptoms today  Goals Unmet:  Not Applicable  Comments: Pt able to follow exercise prescription today without complaint.  Will continue to monitor for progression.    Dr. Oneil Pinal is Medical Director for [facility] Cardiac Rehabilitation.  Dr. Fuad Aleskerov is Medical Director for [facility] Pulmonary Rehabilitation.

## 2023-09-18 ENCOUNTER — Other Ambulatory Visit
Admission: RE | Admit: 2023-09-18 | Discharge: 2023-09-18 | Disposition: A | Discharge status: Home / Self Care | Payer: Medicare Other | Source: Ambulatory Visit | Attending: Pulmonary Disease | Admitting: Pulmonary Disease

## 2023-09-18 DIAGNOSIS — R0602 Shortness of breath: Secondary | ICD-10-CM | POA: Insufficient documentation

## 2023-09-18 LAB — D-DIMER, QUANTITATIVE: D-Dimer, Quant: 0.53 ug{FEU}/mL — ABNORMAL HIGH (ref 0.00–0.50)

## 2023-09-19 ENCOUNTER — Encounter: Payer: Medicare Other | Admitting: *Deleted

## 2023-09-19 DIAGNOSIS — D869 Sarcoidosis, unspecified: Secondary | ICD-10-CM

## 2023-09-19 NOTE — Progress Notes (Signed)
 Daily Session Note  Patient Details  Name: NATOSHA BOU MRN: [id number] Date of Birth: [birth year] Referring Provider:   Flowsheet Row Pulmonary Rehab from 06/28/2023 in [facility] Cardiac and Pulmonary Rehab  Referring Provider Parris Manna, MD       Encounter Date: 09/19/2023  Check In:  Session Check In - 09/19/23 0932       Check-In   Supervising physician immediately available to respond to emergencies See telemetry face sheet for immediately available ER MD    Location [facility]-Cardiac & Pulmonary Rehab    Staff Present Othel Durand, RN, BSN, CCRP;Margaret Best, MS, Exercise Physiologist;Krista Jacques RN, BSN;Maxon Conetta BS, , Exercise Physiologist    Virtual Visit No    Medication changes reported     No    Fall or balance concerns reported    No    Warm-up and Cool-down Performed on first and last piece of equipment    Resistance Training Performed Yes    VAD Patient? No    PAD/SET Patient? No      Pain Assessment   Currently in Pain? No/denies                Social History   Tobacco Use  Smoking Status Never  Smokeless Tobacco Never    Goals Met:  Proper associated with RPD/PD & O2 Sat Independence with exercise equipment Exercise tolerated well No report of concerns or symptoms today  Goals Unmet:  Not Applicable  Comments: Pt able to follow exercise prescription today without complaint.  Will continue to monitor for progression.    Dr. Oneil Pinal is Medical Director for [facility] Cardiac Rehabilitation.  Dr. Fuad Aleskerov is Medical Director for [facility] Pulmonary Rehabilitation.

## 2023-09-20 ENCOUNTER — Encounter: Payer: Self-pay | Admitting: *Deleted

## 2023-09-20 DIAGNOSIS — D869 Sarcoidosis, unspecified: Secondary | ICD-10-CM

## 2023-09-20 NOTE — Progress Notes (Signed)
 Pulmonary Individual Treatment Plan  Patient Details  Name: Megan Daniels MRN: [id number] Date of Birth: 05/09/1951 Referring Provider:   Flowsheet Row Pulmonary Rehab from 06/28/2023 in [facility] Cardiac and Pulmonary Rehab  Referring Provider Erskin Hearing, MD       Initial Encounter Date:  Flowsheet Row Pulmonary Rehab from 06/28/2023 in [facility] Cardiac and Pulmonary Rehab  Date 06/28/23       Visit Diagnosis: Sarcoidosis  Patient's Home Medications on Admission:  Current Outpatient Medications:    acetaminophen  (TYLENOL ) 500 MG tablet, Take 1,000 mg by mouth every 6 (six) hours as needed for moderate pain or headache., Disp: , Rfl:    albuterol  (VENTOLIN  HFA) 108 (90 Base) MCG/ACT inhaler, Inhale 2 puffs into the lungs every 4 (four) hours as needed for wheezing or shortness of breath.  (Patient not taking: Reported on 11/25/2022), Disp: , Rfl:    albuterol  (VENTOLIN  HFA) 108 (90 Base) MCG/ACT inhaler, Inhale into the lungs., Disp: , Rfl:    amLODipine (NORVASC) 5 MG tablet, Take 5 mg by mouth daily., Disp: , Rfl:    aspirin  EC 81 MG tablet, Take 1 tablet (81 mg total) by mouth daily. (Patient taking differently: Take 81 mg by mouth every evening.), Disp: , Rfl:    Black Elderberry (SAMBUCUS ELDERBERRY) 50 MG/5ML SYRP, Take by mouth., Disp: , Rfl:    busPIRone (BUSPAR) 10 MG tablet, Take 10 mg by mouth 2 (two) times daily. (Patient not taking: Reported on 06/21/2023), Disp: , Rfl:    carvedilol (COREG) 12.5 MG tablet, Take 12.5 mg by mouth., Disp: , Rfl:    carvedilol (COREG) 12.5 MG tablet, Take by mouth., Disp: , Rfl:    cephALEXin (KEFLEX) 500 MG capsule, Take 500 mg by mouth 3 (three) times daily. (Patient not taking: Reported on 06/21/2023), Disp: , Rfl:    cetirizine (ZYRTEC) 10 MG chewable tablet, Chew by mouth., Disp: , Rfl:    cetirizine (ZYRTEC) 10 MG tablet, Take by mouth. (Patient not taking: Reported on 06/21/2023), Disp: , Rfl:    Cholecalciferol  (VITAMIN D ) 2000  units CAPS, Take 2,000 Units by mouth daily. , Disp: , Rfl:    Cholecalciferol  (VITAMIN D3) 50 MCG (2000 UT) capsule, Take by mouth., Disp: , Rfl:    Collagen Matrix, Porcine, 2 X 2 X 2 CM MISC, Use 1,000 mg once daily, Disp: , Rfl:    cyanocobalamin (VITAMIN B12) 500 MCG tablet, Take by mouth., Disp: , Rfl:    docusate sodium  (COLACE) 100 MG capsule, Take 100 mg by mouth 2 (two) times daily. , Disp: , Rfl:    DULoxetine (CYMBALTA) 30 MG capsule, Take by mouth., Disp: , Rfl:    famotidine (PEPCID) 40 MG tablet, Take by mouth., Disp: , Rfl:    fluticasone (FLONASE) 50 MCG/ACT nasal spray, Place into the nose., Disp: , Rfl:    folic acid (FOLVITE) 1 MG tablet, Take by mouth., Disp: , Rfl:    gabapentin  (NEURONTIN ) 300 MG capsule, Take by mouth. (Patient not taking: Reported on 06/21/2023), Disp: , Rfl:    gabapentin  (NEURONTIN ) 300 MG capsule, Take by mouth., Disp: , Rfl:    irbesartan (AVAPRO) 300 MG tablet, TAKE 1 TABLET BY MOUTH DAILY, Disp: , Rfl:    irbesartan (AVAPRO) 300 MG tablet, Take by mouth. (Patient not taking: Reported on 06/21/2023), Disp: , Rfl:    losartan  (COZAAR ) 100 MG tablet, Take 100 mg by mouth at bedtime.  (Patient not taking: Reported on 11/25/2022), Disp: , Rfl:  magnesium  oxide (MAG-OX) 400 MG tablet, Take by mouth., Disp: , Rfl:    metaxalone (SKELAXIN) 800 MG tablet, Take by mouth., Disp: , Rfl:    metoprolol  succinate (TOPROL -XL) 25 MG 24 hr tablet, Take 25 mg by mouth 2 (two) times daily.  (Patient not taking: Reported on 11/25/2022), Disp: , Rfl:    Multiple Vitamin (MULTIVITAMIN WITH MINERALS) TABS tablet, Take 1 tablet by mouth daily., Disp: , Rfl:    omeprazole  (PRILOSEC) 40 MG capsule, TAKE 1 CAPSULE DAILY BEFORE BREAKFAST, Disp: 90 capsule, Rfl: 3   omeprazole  (PRILOSEC) 40 MG capsule, Take by mouth. (Patient not taking: Reported on 06/21/2023), Disp: , Rfl:    predniSONE (DELTASONE) 2.5 MG tablet, Take by mouth. (Patient not taking: Reported on 06/21/2023),  Disp: , Rfl:    sulfamethoxazole-trimethoprim (BACTRIM) 400-80 MG tablet, Take 1 tablet by mouth 3 (three) times a week. (Patient not taking: Reported on 06/21/2023), Disp: , Rfl:    topiramate (TOPAMAX) 50 MG tablet, Take 50 mg by mouth 2 (two) times daily., Disp: , Rfl:    vitamin B-12 (CYANOCOBALAMIN) 500 MCG tablet, Take 500 mcg by mouth daily. , Disp: , Rfl:    vitamin C (ASCORBIC ACID) 500 MG tablet, Take 500 mg by mouth daily., Disp: , Rfl:    Vitamins/Minerals TABS, Take by mouth., Disp: , Rfl:   Past Medical History: Past Medical History:  Diagnosis Date   Abnormal Pap smear of cervix    pos hpv-    Acid reflux    Anxiety    Arthritis    Asthma    Symbicort as needed   Cancer (HCC)    Basal Cell Skin Cancer   Chalazion of left eye    Chronic back pain    spondylolisthesis/stenosis   Chronic UTI    was treated for 3 months with Macrobid and in Dec everything was fine   Constipation    COPD (chronic obstructive pulmonary disease) (HCC)    Diverticulosis    Elevated serum creatinine    with nsaids   Foot drop    right   Genital warts    h/o   GERD (gastroesophageal reflux disease)    takes Prevacid daily   Headache    History of bronchitis    History of colon polyps    History of hiatal hernia    Hx of vertigo    Hypertension    takes Losartan  daily   IBS (irritable bowel syndrome)    Joint pain    Palpitations    takes Metoprolol  daily   Pneumonia    hx of-early 2000's   PONV (postoperative nausea and vomiting)    extreme nausea and vomiting -none last surgery 10/29/14   Sarcoidosis    Seasonal allergies    takes Claritin  daily   Shortness of breath dyspnea    Sleep apnea    cpap 10 yrs   Stress incontinence    Thyroid nodule    URI (upper respiratory infection)    treated self with Mucinex and no fever   Urinary urgency    Vitamin D  deficiency    Weakness    numbness and tingling in both legs    Tobacco Use: Social History   Tobacco Use   Smoking Status Never  Smokeless Tobacco Never    Labs: Review Flowsheet        No data to display           Pulmonary Assessment Scores:  Pulmonary Assessment Scores  Row Name 06/28/23 1537         ADL UCSD   SOB Score total 30     Rest 0     Walk 0     Stairs 1     Bath 1     Dress 1     Shop 1       CAT Score   CAT Score 16       mMRC Score   mMRC Score 1              UCSD: Self-administered rating of dyspnea associated with activities of daily living (ADLs) 6-point scale (0 = "not at all" to 5 = "maximal or unable to do because of breathlessness")  Scoring Scores range from 0 to 120.  Minimally important difference is 5 units  CAT: CAT can identify the health impairment of COPD patients and is better correlated with disease progression.  CAT has a scoring range of zero to 40. The CAT score is classified into four groups of low (less than 10), medium (10 - 20), high (21-30) and very high (31-40) based on the impact level of disease on health status. A CAT score over 10 suggests significant symptoms.  A worsening CAT score could be explained by an exacerbation, poor medication adherence, poor inhaler technique, or progression of COPD or comorbid conditions.  CAT MCID is 2 points  mMRC: mMRC (Modified Medical Research Council) Dyspnea Scale is used to assess the degree of baseline functional disability in patients of respiratory disease due to dyspnea. No minimal important difference is established. A decrease in score of 1 point or greater is considered a positive change.   Pulmonary Function Assessment:  Pulmonary Function Assessment - 06/21/23 1338       Breath   Shortness of Breath Yes;Limiting activity             Exercise Target Goals: Exercise Program Goal: Individual exercise prescription set using results from initial 6 min walk test and THRR while considering  patient's activity barriers and safety.   Exercise Prescription  Goal: Initial exercise prescription builds to 30-45 minutes a day of aerobic activity, 2-3 days per week.  Home exercise guidelines will be given to patient during program as part of exercise prescription that the participant will acknowledge.  Education: Aerobic Exercise: - Group verbal and visual presentation on the components of exercise prescription. Introduces F.I.T.T principle from ACSM for exercise prescriptions.  Reviews F.I.T.T. principles of aerobic exercise including progression. Written material given at graduation.   Education: Resistance Exercise: - Group verbal and visual presentation on the components of exercise prescription. Introduces F.I.T.T principle from ACSM for exercise prescriptions  Reviews F.I.T.T. principles of resistance exercise including progression. Written material given at graduation.    Education: Exercise & Equipment Safety: - Individual verbal instruction and demonstration of equipment use and safety with use of the equipment. Flowsheet Row Pulmonary Rehab from 08/17/2023 in [facility] Cardiac and Pulmonary Rehab  Date 06/28/23  Educator MB  Instruction Review Code 1- Verbalizes Understanding       Education: Exercise Physiology & General Exercise Guidelines: - Group verbal and written instruction with models to review the exercise physiology of the cardiovascular system and associated critical values. Provides general exercise guidelines with specific guidelines to those with heart or lung disease.    Education: Flexibility, Balance, Mind/Body Relaxation: - Group verbal and visual presentation with interactive activity on the components of exercise prescription. Introduces F.I.T.T principle from ACSM for exercise prescriptions.  Reviews F.I.T.T. principles of flexibility and balance exercise training including progression. Also discusses the mind body connection.  Reviews various relaxation techniques to help reduce and manage stress (i.e. Deep breathing,  progressive muscle relaxation, and visualization). Balance handout provided to take home. Written material given at graduation.   Activity Barriers & Risk Stratification:  Activity Barriers & Cardiac Risk Stratification - 06/28/23 1528       Activity Barriers & Cardiac Risk Stratification   Activity Barriers Back Problems;Shortness of Breath             6 Minute Walk:  6 Minute Walk     Row Name 06/28/23 1525         6 Minute Walk   Phase Initial     Distance 1040 feet     Walk Time 6 minutes     # of Rest Breaks 0     MPH 1.97     METS 1.77     RPE 13     Perceived Dyspnea  2     VO2 Peak 6.19     Symptoms Yes (comment)     Comments Bilateral hip pain     Resting HR 73 bpm     Resting BP 112/66     Resting Oxygen Saturation  94 %     Exercise Oxygen Saturation  during 6 min walk 91 %     Max Ex. HR 90 bpm     Max Ex. BP 126/66     2 Minute Post BP 110/66       Interval HR   1 Minute HR 67     2 Minute HR 89     3 Minute HR 90     4 Minute HR 90     5 Minute HR 90     6 Minute HR 89     2 Minute Post HR 66     Interval Heart Rate? Yes       Interval Oxygen   Interval Oxygen? Yes     Baseline Oxygen Saturation % 94 %     1 Minute Oxygen Saturation % 93 %     1 Minute Liters of Oxygen 0 L     2 Minute Oxygen Saturation % 91 %     2 Minute Liters of Oxygen 0 L     3 Minute Oxygen Saturation % 92 %     3 Minute Liters of Oxygen 0 L     4 Minute Oxygen Saturation % 92 %     4 Minute Liters of Oxygen 0 L     5 Minute Oxygen Saturation % 93 %     5 Minute Liters of Oxygen 0 L     6 Minute Oxygen Saturation % 95 %     6 Minute Liters of Oxygen 0 L     2 Minute Post Oxygen Saturation % 98 %     2 Minute Post Liters of Oxygen 0 L             Oxygen Initial Assessment:  Oxygen Initial Assessment - 06/21/23 1337       Home Oxygen   Home Oxygen Device None    Sleep Oxygen Prescription CPAP    Liters per minute 0    Home Exercise Oxygen  Prescription None    Home Resting Oxygen Prescription None    Compliance with Home Oxygen Use Yes      Initial 6 min Walk  Oxygen Used None      Program Oxygen Prescription   Program Oxygen Prescription None      Intervention   Short Term Goals To learn and exhibit compliance with exercise, home and travel O2 prescription;To learn and understand importance of monitoring SPO2 with pulse oximeter and demonstrate accurate use of the pulse oximeter.;To learn and understand importance of maintaining oxygen saturations>88%;To learn and demonstrate proper pursed lip breathing techniques or other breathing techniques. ;To learn and demonstrate proper use of respiratory medications    Long  Term Goals Exhibits compliance with exercise, home  and travel O2 prescription;Verbalizes importance of monitoring SPO2 with pulse oximeter and return demonstration;Maintenance of O2 saturations>88%;Exhibits proper breathing techniques, such as pursed lip breathing or other method taught during program session;Compliance with respiratory medication;Demonstrates proper use of MDI's             Oxygen Re-Evaluation:  Oxygen Re-Evaluation     Row Name 07/06/23 1308 07/25/23 0925 08/17/23 1040 09/05/23 1323       Program Oxygen Prescription   Program Oxygen Prescription -- None None None      Home Oxygen   Home Oxygen Device -- None None None    Sleep Oxygen Prescription -- CPAP CPAP CPAP    Liters per minute -- 0 0 --    Home Exercise Oxygen Prescription -- None None None    Home Resting Oxygen Prescription -- None None None    Compliance with Home Oxygen Use -- Yes Yes Yes      Goals/Expected Outcomes   Short Term Goals -- To learn and exhibit compliance with exercise, home and travel O2 prescription;To learn and understand importance of monitoring SPO2 with pulse oximeter and demonstrate accurate use of the pulse oximeter.;To learn and understand importance of maintaining oxygen saturations>88%;To  learn and demonstrate proper pursed lip breathing techniques or other breathing techniques. ;To learn and demonstrate proper use of respiratory medications To learn and exhibit compliance with exercise, home and travel O2 prescription;To learn and understand importance of monitoring SPO2 with pulse oximeter and demonstrate accurate use of the pulse oximeter.;To learn and understand importance of maintaining oxygen saturations>88%;To learn and demonstrate proper pursed lip breathing techniques or other breathing techniques. ;To learn and demonstrate proper use of respiratory medications To learn and exhibit compliance with exercise, home and travel O2 prescription;To learn and understand importance of monitoring SPO2 with pulse oximeter and demonstrate accurate use of the pulse oximeter.;To learn and understand importance of maintaining oxygen saturations>88%;To learn and demonstrate proper pursed lip breathing techniques or other breathing techniques. ;To learn and demonstrate proper use of respiratory medications    Long  Term Goals -- Exhibits compliance with exercise, home  and travel O2 prescription;Verbalizes importance of monitoring SPO2 with pulse oximeter and return demonstration;Maintenance of O2 saturations>88%;Exhibits proper breathing techniques, such as pursed lip breathing or other method taught during program session;Compliance with respiratory medication;Demonstrates proper use of MDI's Exhibits compliance with exercise, home  and travel O2 prescription;Verbalizes importance of monitoring SPO2 with pulse oximeter and return demonstration;Maintenance of O2 saturations>88%;Exhibits proper breathing techniques, such as pursed lip breathing or other method taught during program session;Compliance with respiratory medication;Demonstrates proper use of MDI's Exhibits compliance with exercise, home  and travel O2 prescription;Verbalizes importance of monitoring SPO2 with pulse oximeter and return  demonstration;Maintenance of O2 saturations>88%;Exhibits proper breathing techniques, such as pursed lip breathing or other method taught during program session;Compliance with respiratory medication;Demonstrates proper use of MDI's    Comments Reviewed PLB technique  with pt.  Talked about how it works and it's importance in maintaining their exercise saturations. Reviewed PLB technique with her. SHowed her how it works and when to use it. Taara is noticing improvement on her breathing since starting the program. She has been practicing her PLB with success. She attended the chronic lung disease review class today and enjoyed it. She reads articles on sarcoidosis attempting to find more information since it isn't discussed much. She plans to ask her doctor more questions at their next appt Violetta continues to practice PLB which she believes has helped her breathing in the program. She has experienced some SOB outside of rehab when rushing to get places and walking up inclines. We encouraged her to practice PLB when these situations arise. We also discussed some exercise techniques for her to become more conditioned to walking up inclines.    Goals/Expected Outcomes Short: Become more profiecient at using PLB. Long: Become independent at using PLB. Short: practive using PLB when short of breath and looking to breath easier. Long: become independed at using PLB Short: attend pulmonary rehab for exercise and breathing techniques and ask her doctor her questions on her disease. Long: independenlty manage her breathing. Short: Continue to attend pulmonary rehab for exercise and breathing techniques. Long: Independenlty manage SOB.             Oxygen Discharge (Final Oxygen Re-Evaluation):  Oxygen Re-Evaluation - 09/05/23 1323       Program Oxygen Prescription   Program Oxygen Prescription None      Home Oxygen   Home Oxygen Device None    Sleep Oxygen Prescription CPAP    Home Exercise Oxygen  Prescription None    Home Resting Oxygen Prescription None    Compliance with Home Oxygen Use Yes      Goals/Expected Outcomes   Short Term Goals To learn and exhibit compliance with exercise, home and travel O2 prescription;To learn and understand importance of monitoring SPO2 with pulse oximeter and demonstrate accurate use of the pulse oximeter.;To learn and understand importance of maintaining oxygen saturations>88%;To learn and demonstrate proper pursed lip breathing techniques or other breathing techniques. ;To learn and demonstrate proper use of respiratory medications    Long  Term Goals Exhibits compliance with exercise, home  and travel O2 prescription;Verbalizes importance of monitoring SPO2 with pulse oximeter and return demonstration;Maintenance of O2 saturations>88%;Exhibits proper breathing techniques, such as pursed lip breathing or other method taught during program session;Compliance with respiratory medication;Demonstrates proper use of MDI's    Comments Marinelle continues to practice PLB which she believes has helped her breathing in the program. She has experienced some SOB outside of rehab when rushing to get places and walking up inclines. We encouraged her to practice PLB when these situations arise. We also discussed some exercise techniques for her to become more conditioned to walking up inclines.    Goals/Expected Outcomes Short: Continue to attend pulmonary rehab for exercise and breathing techniques. Long: Independenlty manage SOB.             Initial Exercise Prescription:  Initial Exercise Prescription - 06/28/23 1500       Date of Initial Exercise RX and Referring Provider   Date 06/28/23    Referring Provider Aleskerov, Fuad, MD      Oxygen   Maintain Oxygen Saturation 88% or higher      Recumbant Bike   Level 1    RPM 50    Watts 15    Minutes 15  METs 1.77      NuStep   Level 1    SPM 80    Minutes 15    METs 1.77      Arm Ergometer   Level  1    RPM 30    Minutes 15    METs 1.77      Biostep-RELP   Level 1    SPM 50    Minutes 15    METs 1.77      Track   Laps 14    Minutes 15    METs 1.76      Prescription Details   Frequency (times per week) 3    Duration Progress to 30 minutes of continuous aerobic without signs/symptoms of physical distress      Intensity   THRR 40-80% of Max Heartrate 102-132    Ratings of Perceived Exertion 11-13    Perceived Dyspnea 0-4      Progression   Progression Continue to progress workloads to maintain intensity without signs/symptoms of physical distress.      Resistance Training   Training Prescription Yes    Weight 2 lb    Reps 10-15             Perform Capillary Blood Glucose checks as needed.  Exercise Prescription Changes:   Exercise Prescription Changes     Row Name 06/28/23 1500 07/20/23 1400 08/01/23 1400 08/10/23 1000 08/17/23 1600     Response to Exercise   Blood Pressure (Admit) 112/66 124/76 104/58 -- 108/60   Blood Pressure (Exercise) 126/66 124/70 144/72 -- --   Blood Pressure (Exit) 110/66 118/68 110/58 -- 108/62   Heart Rate (Admit) 73 bpm 64 bpm 71 bpm -- 61 bpm   Heart Rate (Exercise) 90 bpm 88 bpm 88 bpm -- 92 bpm   Heart Rate (Exit) 66 bpm 65 bpm 74 bpm -- 68 bpm   Oxygen Saturation (Admit) 94 % 95 % 95 % -- 94 %   Oxygen Saturation (Exercise) 91 % 91 % 90 % -- 91 %   Oxygen Saturation (Exit) 98 % 95 % 92 % -- 96 %   Rating of Perceived Exertion (Exercise) 13 13 15  -- 14   Perceived Dyspnea (Exercise) 2 2 3  -- 3   Symptoms bilateral hip pain none -- -- none   Comments results First three days of exercise -- -- --   Duration Progress to 30 minutes of  aerobic without signs/symptoms of physical distress Progress to 30 minutes of  aerobic without signs/symptoms of physical distress Continue with 30 min of aerobic exercise without signs/symptoms of physical distress. -- Continue with 30 min of aerobic exercise without signs/symptoms of  physical distress.   Intensity THRR New THRR unchanged THRR unchanged -- THRR unchanged     Progression   Progression Continue to progress workloads to maintain intensity without signs/symptoms of physical distress. Continue to progress workloads to maintain intensity without signs/symptoms of physical distress. Continue to progress workloads to maintain intensity without signs/symptoms of physical distress. -- Continue to progress workloads to maintain intensity without signs/symptoms of physical distress.   Average METs 1.77 2.33 2.58 -- 2.71     Resistance Training   Training Prescription -- Yes Yes -- Yes   Weight -- 3 lb 3 lb -- 3 lb   Reps -- 10-15 10-15 -- 10-15     Interval Training   Interval Training -- No No -- No     Recumbant Bike   Level --  1 2 -- 2   Watts -- 25 25 -- 25   Minutes -- 15 15 -- 15   METs -- 2.95 2.96 -- 2.96     NuStep   Level -- 2 3 -- 4   Minutes -- 30 15 -- 15   METs -- 2.2 2.5 -- 3     Arm Ergometer   Level -- -- 1 -- --   Minutes -- -- 15 -- --   METs -- -- 1.9 -- --     Biostep-RELP   Level -- -- 2 -- 3   Minutes -- -- 15 -- 15   METs -- -- 3 -- 2.8     Track   Laps -- 25 35 -- 15   Minutes -- 15 15 -- 15   METs -- 2.36 2.9 -- 1.82     Home Exercise Plan   Plans to continue exercise at -- -- -- Home (comment)  Recumbent at home, walking outside (weather permitting) Home (comment)  Recumbent at home, walking outside (weather permitting)   Frequency -- -- -- Add 2 additional days to program exercise sessions. Add 2 additional days to program exercise sessions.   Initial Home Exercises Provided -- -- -- 08/10/23 08/10/23     Oxygen   Maintain Oxygen Saturation -- 88% or higher 88% or higher 88% or higher 88% or higher    Row Name 08/29/23 0800 09/13/23 1300           Response to Exercise   Blood Pressure (Admit) 106/54 108/58      Blood Pressure (Exit) 102/62 108/66      Heart Rate (Admit) 75 bpm 61 bpm      Heart Rate  (Exercise) 93 bpm 90 bpm      Heart Rate (Exit) 77 bpm 76 bpm      Oxygen Saturation (Admit) 85 % 92 %      Oxygen Saturation (Exercise) 91 % 90 %      Oxygen Saturation (Exit) 92 % 94 %      Rating of Perceived Exertion (Exercise) 14 14      Perceived Dyspnea (Exercise) 3 1      Symptoms none none      Duration Continue with 30 min of aerobic exercise without signs/symptoms of physical distress. Continue with 30 min of aerobic exercise without signs/symptoms of physical distress.      Intensity THRR unchanged THRR unchanged        Progression   Progression Continue to progress workloads to maintain intensity without signs/symptoms of physical distress. Continue to progress workloads to maintain intensity without signs/symptoms of physical distress.      Average METs 2.87 2.3        Resistance Training   Training Prescription Yes Yes      Weight 3 lb 3 lb      Reps 10-15 10-15        Interval Training   Interval Training No No        Treadmill   MPH -- 1      Grade -- 0      Minutes -- 15      METs -- 1.77        Recumbant Bike   Level 1 1      Watts 19 19      Minutes 15 15      METs 2.74 2.75        NuStep   Level 4 3  Minutes 15 15      METs 3.1 2.8        Track   Laps 30 16      Minutes 15 15      METs 2.63 1.87        Home Exercise Plan   Plans to continue exercise at Home (comment)  Recumbent at home, walking outside (weather permitting) Home (comment)  Recumbent at home, walking outside (weather permitting)      Frequency Add 2 additional days to program exercise sessions. Add 2 additional days to program exercise sessions.      Initial Home Exercises Provided 08/10/23 08/10/23        Oxygen   Maintain Oxygen Saturation 88% or higher 88% or higher               Exercise Comments:   Exercise Comments     Row Name 07/06/23 2956           Exercise Comments First full day of exercise!  Patient was oriented to gym and equipment including  functions, settings, policies, and procedures.  Patient's individual exercise prescription and treatment plan were reviewed.  All starting workloads were established based on the results of the 6 minute walk test done at initial orientation visit.  The plan for exercise progression was also introduced and progression will be customized based on patient's performance and goals.                Exercise Goals and Review:   Exercise Goals     Row Name 06/28/23 1536             Exercise Goals   Increase Physical Activity Yes       Intervention Provide advice, education, support and counseling about physical activity/exercise needs.;Develop an individualized exercise prescription for aerobic and resistive training based on initial evaluation findings, risk stratification, comorbidities and participant's personal goals.       Expected Outcomes Short Term: Attend rehab on a regular basis to increase amount of physical activity.;Long Term: Add in home exercise to make exercise part of routine and to increase amount of physical activity.;Long Term: Exercising regularly at least 3-5 days a week.       Increase Strength and Stamina Yes       Intervention Provide advice, education, support and counseling about physical activity/exercise needs.;Develop an individualized exercise prescription for aerobic and resistive training based on initial evaluation findings, risk stratification, comorbidities and participant's personal goals.       Expected Outcomes Short Term: Increase workloads from initial exercise prescription for resistance, speed, and METs.;Short Term: Perform resistance training exercises routinely during rehab and add in resistance training at home;Long Term: Improve cardiorespiratory fitness, muscular endurance and strength as measured by increased METs and functional capacity ( )       Able to understand and use rate of perceived exertion (RPE) scale Yes       Intervention Provide  education and explanation on how to use RPE scale       Expected Outcomes Short Term: Able to use RPE daily in rehab to express subjective intensity level;Long Term:  Able to use RPE to guide intensity level when exercising independently       Able to understand and use Dyspnea scale Yes       Intervention Provide education and explanation on how to use Dyspnea scale       Expected Outcomes Short Term: Able to use Dyspnea scale daily in rehab  to express subjective sense of shortness of breath during exertion;Long Term: Able to use Dyspnea scale to guide intensity level when exercising independently       Knowledge and understanding of Target Heart Rate Range (THRR) Yes       Intervention Provide education and explanation of THRR including how the numbers were predicted and where they are located for reference       Expected Outcomes Short Term: Able to state/look up THRR;Long Term: Able to use THRR to govern intensity when exercising independently;Short Term: Able to use daily as guideline for intensity in rehab       Able to check pulse independently Yes       Intervention Provide education and demonstration on how to check pulse in carotid and radial arteries.;Review the importance of being able to check your own pulse for safety during independent exercise       Expected Outcomes Short Term: Able to explain why pulse checking is important during independent exercise;Long Term: Able to check pulse independently and accurately       Understanding of Exercise Prescription Yes       Intervention Provide education, explanation, and written materials on patient's individual exercise prescription       Expected Outcomes Short Term: Able to explain program exercise prescription;Long Term: Able to explain home exercise prescription to exercise independently                Exercise Goals Re-Evaluation :  Exercise Goals Re-Evaluation     Row Name 07/06/23 0937 07/20/23 1500 07/25/23 0919 08/01/23  1500 08/10/23 1010     Exercise Goal Re-Evaluation   Exercise Goals Review Able to understand and use rate of perceived exertion (RPE) scale;Able to understand and use Dyspnea scale;Knowledge and understanding of Target Heart Rate Range (THRR);Understanding of Exercise Prescription Increase Physical Activity;Increase Strength and Stamina;Understanding of Exercise Prescription Increase Physical Activity;Increase Strength and Stamina;Understanding of Exercise Prescription Increase Physical Activity;Increase Strength and Stamina;Understanding of Exercise Prescription Increase Physical Activity;Increase Strength and Stamina;Understanding of Exercise Prescription;Able to understand and use Dyspnea scale;Knowledge and understanding of Target Heart Rate Range (THRR);Able to understand and use rate of perceived exertion (RPE) scale;Able to check pulse independently   Comments Reviewed RPE and dyspnea scale, THR and program prescription with pt today.  Pt voiced understanding and was given a copy of goals to take home. Shandi is off to a good start in the program. She was able to walk up to 25 laps on the track and work at level 1 on the recumbent bike. She also improved to level 2 on the T4 nustep and increased to 3 lb hand weights for resistance training. We will continue to monitor her progress in the program. She is doing well with exercise at rehab, currently on level 2 with the T4. was able to get 30laps walking the track. Is still doing well with 3lbs with hand weight. She is not exercising much at home, walking to the mailbox and back, ~1/4 mile daily. Encouraged her to walk and look for exercises she can do at home, to speak with exercise team here at rehab if she has questions. Sumayah continues to do well in rehab. She has increased her level to 3 on the T4 nustep and increased her laps to 35 on the track. She has also increased her level to 2 on the recumbent bike and biostep. We will continue to monitor her  progress in the program. Reviewed home exercise with pt today from  09:20 to 09:34.  Pt plans to use recumbent bike at home and walk outside on days away from rehab for exercise.  Reviewed THR, pulse, RPE, sign and symptoms, pulse oximetery and when to call 911 or MD.  Also discussed weather considerations and indoor options.  Pt voiced understanding.   Expected Outcomes Short: Use RPE daily to regulate intensity. Long: Follow program prescription in THR. Short: Continue to follow current exercise prescription. Long: Continue exercise to improve strength and stamina. Short: Continue to follow current exercise at rehab and at home. Long: Continue exercise and improve strength and stamina Short: Continue to progressively increase workloads with the nustep, recumbent bike, biostep, and track. Long: Continue exercise to improve strength and stamina. Short: Begin using recumbent bike at home on days away from rehab. Long: Continue to exercise independently.    Row Name 08/17/23 1656 08/29/23 0810 09/05/23 0957 09/13/23 1342       Exercise Goal Re-Evaluation   Exercise Goals Review Increase Physical Activity;Increase Strength and Stamina;Understanding of Exercise Prescription Increase Physical Activity;Increase Strength and Stamina;Understanding of Exercise Prescription Increase Physical Activity;Increase Strength and Stamina;Understanding of Exercise Prescription Increase Physical Activity;Increase Strength and Stamina;Understanding of Exercise Prescription    Comments Pa is doing well in rehab. She was able to increase her level on the T4 nustep from level 3 to 4. She was also able to increase her level on the biostep from level 2 to 3. We will continue to monitor her progress in the program. Keana continues to do well in the program. She continues to work at level 4 on the T4 nustep and level 1 on the recumbent bike. She also continues to walk 30 laps on the track and uses 3 lb hand weights for resistance  training. We will continue to monitor her progress in the program. Haniah is doing well in the program. She states that she has not yet started exercising on her days away from rehab as she was busy around the holidays. She plans to start adding in home exercise by walking at home and using her recumbent bike. She also mentions that she and her husband are looking into possibly joining a gym. We will continue to monitor her progress int the program. Shanteria is doing well in rehab. She recently used the treadmill for the first time at a speed of . She was also able to maintain her level on the recumbent bike, and decreased to level 3 on the T4 nustep. We will continue to monitor her progression in the program.    Expected Outcomes Short: Try level 3 on the recumbent bike. Long: Continue to exercise to improve strength and stamina. Short: Try improving to level 2 on the recumbent bike. Long: Continue to exercise to improve strength and stamina. Short: Begin walking and using recumbent bike on days away from rehab. Long: Continue exercise to improve strength and stamina. Short: Continue to use and increase treadmill workloads. Long: Continue exercise to increase strength and stamina.             Discharge Exercise Prescription (Final Exercise Prescription Changes):  Exercise Prescription Changes - 09/13/23 1300       Response to Exercise   Blood Pressure (Admit) 108/58    Blood Pressure (Exit) 108/66    Heart Rate (Admit) 61 bpm    Heart Rate (Exercise) 90 bpm    Heart Rate (Exit) 76 bpm    Oxygen Saturation (Admit) 92 %    Oxygen Saturation (Exercise) 90 %  Oxygen Saturation (Exit) 94 %    Rating of Perceived Exertion (Exercise) 14    Perceived Dyspnea (Exercise) 1    Symptoms none    Duration Continue with 30 min of aerobic exercise without signs/symptoms of physical distress.    Intensity THRR unchanged      Progression   Progression Continue to progress workloads to maintain  intensity without signs/symptoms of physical distress.    Average METs 2.3      Resistance Training   Training Prescription Yes    Weight 3 lb    Reps 10-15      Interval Training   Interval Training No      Treadmill   MPH 1    Grade 0    Minutes 15    METs 1.77      Recumbant Bike   Level 1    Watts 19    Minutes 15    METs 2.75      NuStep   Level 3    Minutes 15    METs 2.8      Track   Laps 16    Minutes 15    METs 1.87      Home Exercise Plan   Plans to continue exercise at Home (comment)   Recumbent at home, walking outside (weather permitting)   Frequency Add 2 additional days to program exercise sessions.    Initial Home Exercises Provided 08/10/23      Oxygen   Maintain Oxygen Saturation 88% or higher             Nutrition:  Target Goals: Understanding of nutrition guidelines, daily intake of sodium 1500mg , cholesterol 200mg , calories 30% from fat and 7% or less from saturated fats, daily to have 5 or more servings of fruits and vegetables.  Education: All About Nutrition: -Group instruction provided by verbal, written material, interactive activities, discussions, models, and posters to present general guidelines for heart healthy nutrition including fat, fiber, MyPlate, the role of sodium in heart healthy nutrition, utilization of the nutrition label, and utilization of this knowledge for meal planning. Follow up email sent as well. Written material given at graduation.   Biometrics:  Pre Biometrics - 06/28/23 1537       Pre Biometrics   Height 5' 2.1" (1.577 m)    Weight 182 lb 3.2 oz (82.6 kg)    Waist Circumference 43 inches    Hip Circumference 45.5 inches    Waist to Hip Ratio 0.95 %    BMI (Calculated) 33.23    Single Leg Stand 12 seconds              Nutrition Therapy Plan and Nutrition Goals:  Nutrition Therapy & Goals - 06/28/23 1602       Nutrition Therapy   Diet Cardiac, Low Na    Protein (specify units) 75     Fiber 25 grams    Whole Grain Foods 3 servings    Saturated Fats 15 max. grams    Fruits and Vegetables 5 servings/day    Sodium 2 grams      Personal Nutrition Goals   Nutrition Goal Drink ~3 bottles of water per day    Personal Goal #2 Eat a protein and carb at each meal    Personal Goal #3 Use small nutrient dense snacks when not very hungry    Comments Patient drinking 32oz of water daily, spoke about increasing to 48oz. She says sweets are her biggest struggle. Recommended some  healthier sweet treat ideas to try. Encouraged her to drink some water before snacking. She reports she is trying to lose weight, doesn't eat a lot but still struggling to lose the weight. Spoke to her about nutrient and calorie density, encouraged her to eat smaller but more nutrient dense foods to help her meet her nutrient goals like protein, fiber, and calories. Reviewed mediterranean diet handout, education on types of fats, sources, and how to read label. Built out several meals and snacks with foods she likes and will eat.      Intervention Plan   Intervention Prescribe, educate and counsel regarding individualized specific dietary modifications aiming towards targeted core components such as weight, hypertension, lipid management, diabetes, heart failure and other comorbidities.;Nutrition handout(s) given to patient.    Expected Outcomes Short Term Goal: Understand basic principles of dietary content, such as calories, fat, sodium, cholesterol and nutrients.;Short Term Goal: A plan has been developed with personal nutrition goals set during dietitian appointment.;Long Term Goal: Adherence to prescribed nutrition plan.             Nutrition Assessments:  MEDIFICTS Score Key: >=70 Need to make dietary changes  40-70 Heart Healthy Diet <= 40 Therapeutic Level Cholesterol Diet  Flowsheet Row Pulmonary Rehab from 06/28/2023 in [facility]  Picture Your Plate Total Score on Admission  72      Picture Your Plate Scores: <16 Unhealthy dietary pattern with much room for improvement. 41-50 Dietary pattern unlikely to meet recommendations for good health and room for improvement. 51-60 More healthful dietary pattern, with some room for improvement.  >60 Healthy dietary pattern, although there may be some specific behaviors that could be improved.   Nutrition Goals Re-Evaluation:  Nutrition Goals Re-Evaluation     Row Name 07/25/23 0933 08/17/23 1046 09/05/23 1306         Goals   Nutrition Goal Drink 3 bottles of water per day -- --     Comment She isnt drinking as much water as she should be. Spoke with her baout the barriers to this goals, concluded together that she is likely forgetting. Suggested she set up a few alarms on her phone to remind her. She agrees that would be a good idea as she uses alarms for other things in her daily routine. Also spoke with her about eating adequate protein and carbs to support energy and increased needs. She is doing that most meals but sometimes doesnt feel hungry and will miss dinner or use a snack instead. Raylynne is still trying to increase her water intake. She is carrying her water around more which is an improvement. She has been using alarms to help remind herself. Her appetite is about the same and she notes she has been extra busy during this holiday season. Pamelyn is still trying to increase her water intake. She states that she is doing better, but could still improve. She is still dealing with her poor appetite, but is trying to include more protein in meals, especially with her breakfast.     Expected Outcome Short: Drink 3 bottles of water and dont miss meals. Long: Maintain healthy hydration and eating habits Short: set reminders for water and healthy eating during this busy holiday season. Long:  independently manage healthy eating habits Short: Continue to drink more water throughout the day. Long: Continue to practice dietary  patterns discussed with RD.              Nutrition Goals Discharge (Final  Nutrition Goals Re-Evaluation):  Nutrition Goals Re-Evaluation - 09/05/23 1306       Goals   Comment Bekki is still trying to increase her water intake. She states that she is doing better, but could still improve. She is still dealing with her poor appetite, but is trying to include more protein in meals, especially with her breakfast.    Expected Outcome Short: Continue to drink more water throughout the day. Long: Continue to practice dietary patterns discussed with RD.             Psychosocial: Target Goals: Acknowledge presence or absence of significant depression and/or stress, maximize coping skills, provide positive support system. Participant is able to verbalize types and ability to use techniques and skills needed for reducing stress and depression.   Education: Stress, Anxiety, and Depression - Group verbal and visual presentation to define topics covered.  Reviews how body is impacted by stress, anxiety, and depression.  Also discusses healthy ways to reduce stress and to treat/manage anxiety and depression.  Written material given at graduation.   Education: Sleep Hygiene -Provides group verbal and written instruction about how sleep can affect your health.  Define sleep hygiene, discuss sleep cycles and impact of sleep habits. Review good sleep hygiene tips.    Initial Review & Psychosocial Screening:  Initial Psych Review & Screening - 06/21/23 1339       Initial Review   Current issues with Current Anxiety/Panic      Family Dynamics   Good Support System? Yes    Comments Her husband has some dementia and other health issues. She can call her daughters if she gets anxiety she has some medication for it.      Barriers   Psychosocial barriers to participate in program The patient should benefit from training in stress management and relaxation.      Screening Interventions    Interventions Encouraged to exercise;To provide support and resources with identified psychosocial needs;Provide feedback about the scores to participant    Expected Outcomes Short Term goal: Utilizing psychosocial counselor, staff and physician to assist with identification of specific Stressors or current issues interfering with healing process. Setting desired goal for each stressor or current issue identified.;Long Term Goal: Stressors or current issues are controlled or eliminated.;Short Term goal: Identification and review with participant of any Quality of Life or Depression concerns found by scoring the questionnaire.;Long Term goal: The participant improves quality of Life and PHQ9 Scores as seen by post scores and/or verbalization of changes             Quality of Life Scores:  Scores of 19 and below usually indicate a poorer quality of life in these areas.  A difference of  2-3 points is a clinically meaningful difference.  A difference of 2-3 points in the total score of the Quality of Life Index has been associated with significant improvement in overall quality of life, self-image, physical symptoms, and general health in studies assessing change in quality of life.  PHQ-9: Review Flowsheet       06/28/2023  Depression screen PHQ 2/9  Decreased Interest 0  Down, Depressed, Hopeless 0  PHQ - 2 Score 0  Altered sleeping 0  Tired, decreased energy 1  Change in appetite 1  Feeling bad or failure about yourself  1  Trouble concentrating 0  Moving slowly or fidgety/restless 0  Suicidal thoughts 0  PHQ-9 Score 3   Interpretation of Total Score  Total Score Depression Severity:  1-4 = Minimal depression, 5-9 = Mild depression, 10-14 = Moderate depression, 15-19 = Moderately severe depression, 20-27 = Severe depression   Psychosocial Evaluation and Intervention:  Psychosocial Evaluation - 06/21/23 1342       Psychosocial Evaluation & Interventions   Interventions  Encouraged to exercise with the program and follow exercise prescription;Relaxation education;Stress management education    Comments Her husband has some dementia and other health issues. She can call her daughters if she gets anxiety she has some medication for it.    Expected Outcomes Short: Start [facility] to help with mood. Long: Maintain a healthy mental state.    Continue Psychosocial Services  Follow up required by staff             Psychosocial Re-Evaluation:  Psychosocial Re-Evaluation     Row Name 07/25/23 0930 08/17/23 1035 09/05/23 1302         Psychosocial Re-Evaluation   Current issues with None Identified Current Stress Concerns Current Stress Concerns     Comments She is not dealing with any drepssion or anxiety stressors. She reports she has good social support and relies in prayer and friends. holidays are busy and she needs to get the house ready. Anteria is happy that she can notice some results in the 16 sessions she has been here. She notes her shortness of breath is not as severe doing certain activities. She mentions a some of her family relies on her for transportation and to keep things organized during the holidays. She does have a great support system, but sometimes it weight on her. Her husband's dementia is stable for now and they are looking forward to holiday events because he enjoys eating with friends and makes him happy. 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 She finds comfort in her church and family Mishon reports no major stress concerns at this time. She states that she is still very aware of the reality that her husband is dealing with dementia and parkinsons, however she beleives it is just part of life. She also has a lot of appointments for both her and her husband and she is the one who drives them tot these appointments. She does enjoy reading and doing crafts for stress relief. She also gets together with a ladies group and  plays cards which she finds very enjoyable.     Expected Outcomes short: stay focus and postive during holidays.Long: stay positive and use social support Short: attend pulmonary rehab for education on stress reduction. Long: maintain positive self care habits Short: Continue to manage stress relief through healthy avenues. Long: Maintain positive outlook.     Interventions Encouraged to attend Pulmonary Rehabilitation for the exercise -- Encouraged to attend Pulmonary Rehabilitation for the exercise     Continue Psychosocial Services  Follow up required by staff Follow up required by staff Follow up required by staff              Psychosocial Discharge (Final Psychosocial Re-Evaluation):  Psychosocial Re-Evaluation - 09/05/23 1302       Psychosocial Re-Evaluation   Current issues with Current Stress Concerns    Comments Karon reports no major stress concerns at this time. She states that she is still very aware of the reality that her husband is dealing with dementia and parkinsons, however she beleives it is just part of life. She also has a lot of appointments for both her and her husband and she is the  one who drives them tot these appointments. She does enjoy reading and doing crafts for stress relief. She also gets together with a ladies group and plays cards which she finds very enjoyable.    Expected Outcomes Short: Continue to manage stress relief through healthy avenues. Long: Maintain positive outlook.    Interventions Encouraged to attend Pulmonary Rehabilitation for the exercise    Continue Psychosocial Services  Follow up required by staff             Education: Education Goals: Education classes will be provided on a weekly basis, covering required topics. Participant will state understanding/return demonstration of topics presented.  Learning Barriers/Preferences:  Learning Barriers/Preferences - 06/21/23 1338       Learning Barriers/Preferences   Learning  Barriers Hearing    Learning Preferences None             General Pulmonary Education Topics:  Infection Prevention: - Provides verbal and written material to individual with discussion of infection control including proper hand washing and proper equipment cleaning during exercise session. Flowsheet Row Pulmonary Rehab from 08/17/2023 in [facility] Cardiac and Pulmonary Rehab  Date 06/28/23  Educator MB  Instruction Review Code 1- Verbalizes Understanding       Falls Prevention: - Provides verbal and written material to individual with discussion of falls prevention and safety. Flowsheet Row Pulmonary Rehab from 08/17/2023 in [facility] Cardiac and Pulmonary Rehab  Date 06/28/23  Educator MB  Instruction Review Code 1- Verbalizes Understanding       Chronic Lung Disease Review: - Group verbal instruction with posters, models, PowerPoint presentations and videos,  to review new updates, new respiratory medications, new advancements in procedures and treatments. Providing information on websites and "800" numbers for continued self-education. Includes information about supplement oxygen, available portable oxygen systems, continuous and intermittent flow rates, oxygen safety, concentrators, and Medicare reimbursement for oxygen. Explanation of Pulmonary Drugs, including class, frequency, complications, importance of spacers, rinsing mouth after steroid MDI's, and proper cleaning methods for nebulizers. Review of basic lung anatomy and physiology related to function, structure, and complications of lung disease. Review of risk factors. Discussion about methods for diagnosing sleep apnea and types of masks and machines for OSA. Includes a review of the use of types of environmental controls: home humidity, furnaces, filters, dust mite/pet prevention, HEPA vacuums. Discussion about weather changes, air quality and the benefits of nasal washing. Instruction on Warning signs, infection symptoms,  calling MD promptly, preventive modes, and value of vaccinations. Review of effective airway clearance, coughing and/or vibration techniques. Emphasizing that all should Create an Action Plan. Written material given at graduation. Flowsheet Row Pulmonary Rehab from 08/17/2023 in [facility] Cardiac and Pulmonary Rehab  Education need identified 06/28/23  Date 08/17/23  Educator [facility]  Instruction Review Code 1- Verbalizes Understanding       AED/CPR: - Group verbal and written instruction with the use of models to demonstrate the basic use of the AED with the basic ABC's of resuscitation.    Anatomy and Cardiac Procedures: - Group verbal and visual presentation and models provide information about basic cardiac anatomy and function. Reviews the testing methods done to diagnose heart disease and the outcomes of the test results. Describes the treatment choices: Medical Management, Angioplasty, or Coronary Bypass Surgery for treating various heart conditions including Myocardial Infarction, Angina, Valve Disease, and Cardiac Arrhythmias.  Written material given at graduation.   Medication Safety: - Group verbal and visual instruction to review commonly prescribed medications for heart and lung  disease. Reviews the medication, class of the drug, and side effects. Includes the steps to properly store meds and maintain the prescription regimen.  Written material given at graduation.   Other: -Provides group and verbal instruction on various topics (see comments)   Knowledge Questionnaire Score:  Knowledge Questionnaire Score - 06/28/23 1554       Knowledge Questionnaire Score   Pre Score 16/18              Core Components/Risk Factors/Patient Goals at Admission:  Personal Goals and Risk Factors at Admission - 06/28/23 1539       Core Components/Risk Factors/Patient Goals on Admission    Weight Management Weight Loss;Yes    Intervention Weight Management: Develop a combined nutrition  and exercise program designed to reach desired caloric intake, while maintaining appropriate intake of nutrient and fiber, sodium and fats, and appropriate energy expenditure required for the weight goal.;Weight Management: Provide education and appropriate resources to help participant work on and attain dietary goals.;Weight Management/Obesity: Establish reasonable short term and long term weight goals.    Admit Weight 182 lb 3.2 oz (82.6 kg)    Goal Weight: Short Term 172 lb 3.2 oz (78.1 kg)    Goal Weight: Long Term 162 lb 3.2 oz (73.6 kg)    Expected Outcomes Short Term: Continue to assess and modify interventions until short term weight is achieved;Long Term: Adherence to nutrition and physical activity/exercise program aimed toward attainment of established weight goal;Weight Loss: Understanding of general recommendations for a balanced deficit meal plan, which promotes 1-2 lb weight loss per week and includes a negative energy balance of [id number] kcal/d;Understanding recommendations for meals to include 15-35% energy as protein, 25-35% energy from fat, 35-60% energy from carbohydrates, less than 200mg  of dietary cholesterol, 20-35 gm of total fiber daily;Understanding of distribution of calorie intake throughout the day with the consumption of 4-5 meals/snacks    Improve shortness of breath with ADL's Yes    Intervention Provide education, individualized exercise plan and daily activity instruction to help decrease symptoms of SOB with activities of daily living.    Expected Outcomes Long Term: Be able to perform more ADLs without symptoms or delay the onset of symptoms;Short Term: Improve cardiorespiratory fitness to achieve a reduction of symptoms when performing ADLs    Hypertension Yes    Intervention Provide education on lifestyle modifcations including regular physical activity/exercise, weight management, moderate sodium restriction and increased consumption of fresh fruit, vegetables, and  low fat dairy, alcohol moderation, and smoking cessation.;Monitor prescription use compliance.    Expected Outcomes Short Term: Continued assessment and intervention until BP is < 140/80mm HG in hypertensive participants. < 130/2mm HG in hypertensive participants with diabetes, heart failure or chronic kidney disease.;Long Term: Maintenance of blood pressure at goal levels.    Lipids Yes    Intervention Provide education and support for participant on nutrition & aerobic/resistive exercise along with prescribed medications to achieve LDL 70mg , HDL >40mg .    Expected Outcomes Short Term: Participant states understanding of desired cholesterol values and is compliant with medications prescribed. Participant is following exercise prescription and nutrition guidelines.;Long Term: Cholesterol controlled with medications as prescribed, with individualized exercise RX and with personalized nutrition plan. Value goals: LDL < 70mg , HDL > 40 mg.             Education:Diabetes - Individual verbal and written instruction to review signs/symptoms of diabetes, desired ranges of glucose level fasting, after meals and with exercise. Acknowledge that pre and post  exercise glucose checks will be done for 3 sessions at entry of program.   Know Your Numbers and Heart Failure: - Group verbal and visual instruction to discuss disease risk factors for cardiac and pulmonary disease and treatment options.  Reviews associated critical values for Overweight/Obesity, Hypertension, Cholesterol, and Diabetes.  Discusses basics of heart failure: signs/symptoms and treatments.  Introduces Heart Failure Zone chart for action plan for heart failure.  Written material given at graduation.   Core Components/Risk Factors/Patient Goals Review:   Goals and Risk Factor Review     Row Name 07/25/23 0935 08/17/23 1031 09/05/23 1318         Core Components/Risk Factors/Patient Goals Review   Personal Goals Review Weight  Management/Obesity Improve shortness of breath with ADL's;Hypertension Hypertension;Improve shortness of breath with ADL's     Review Patient is eating better snacks, working on drinking more water, choosing healthy foods but is frustrated at not losing weight. Spoke with her about the importance of not missing meals as she sometimes does, how low caloire foods are often healthy but can make it hard to meet caloire goals when not eaten in larger quantities. Educated her on the use of healthy fats for calorie dense options. Alaina has noticed an improvement in her breathing since starting the program. She notes that it is easier to go up and down the bleachers at her grandson's basketball game and to keep the same pace as her husband. Her blood pressure has been stable and she saw her primary care doctor this week and was given the all good to keep taking her current medication. He did add metformin because she is becoming insulin resistant so they are trying to get ahead of it by medication and lifestyle management. Deprise states that her SOB has been well controlled while in the program, however, when she is rushing or going up an incline she tends to get more SOB. We discussed beginning to add incline on the treadmill to condition her, so that she will be able to walk up inclines without being as SOB. She also states that she checks her BP routinely throughout the week and has noticed that it has been lower. She also states that her HR has been lower, sometimes in the 50's which she believes has made her feel more fatigued. I encouraged her to talk to her doctor about her medications, as she feels this is the cause of her lower BP and heart rate.     Expected Outcomes Short: do not miss meals, use healthy fats to meet caloire needs for weight loss. Long: Maintain healthy eating habits to support weight loss Short: attend pulmonary rehab for supervised exercise education and take her metformin  Long:  independently manage risk factors Short: Talk to doctor about medications and low heart rate. Long: Continue to manage lifestyle risk factors.              Core Components/Risk Factors/Patient Goals at Discharge (Final Review):   Goals and Risk Factor Review - 09/05/23 1318       Core Components/Risk Factors/Patient Goals Review   Personal Goals Review Hypertension;Improve shortness of breath with ADL's    Review Britzel states that her SOB has been well controlled while in the program, however, when she is rushing or going up an incline she tends to get more SOB. We discussed beginning to add incline on the treadmill to condition her, so that she will be able to walk up inclines without being as SOB.  She also states that she checks her BP routinely throughout the week and has noticed that it has been lower. She also states that her HR has been lower, sometimes in the 50's which she believes has made her feel more fatigued. I encouraged her to talk to her doctor about her medications, as she feels this is the cause of her lower BP and heart rate.    Expected Outcomes Short: Talk to doctor about medications and low heart rate. Long: Continue to manage lifestyle risk factors.             ITP Comments:  ITP Comments     Row Name 06/21/23 1336 06/28/23 1556 07/05/23 0834 07/06/23 0937 07/26/23 1123   ITP Comments Virtual Visit completed. Patient informed on EP and RD appointment and 6 Minute walk test. Patient also informed of patient health questionnaires on My Chart. Patient Verbalizes understanding. Visit diagnosis can be found in [facility] 06/13/2023. Completed and gym orientation. Initial ITP created and sent for review to Dr. Faud Aleskerov, Medical Director. 30 Day review completed. Medical Director ITP review done, changes made as directed, and signed approval by Medical Director.    new to program First full day of exercise!  Patient was oriented to gym and equipment including functions,  settings, policies, and procedures.  Patient's individual exercise prescription and treatment plan were reviewed.  All starting workloads were established based on the results of the 6 minute walk test done at initial orientation visit.  The plan for exercise progression was also introduced and progression will be customized based on patient's performance and goals. 30 Day review completed. Medical Director ITP review done, changes made as directed, and signed approval by Medical Director.    new to program    Row Name 08/23/23 0937 09/20/23 1244         ITP Comments 30 Day review completed. Medical Director ITP review done, changes made as directed, and signed approval by Medical Director. 30 Day review completed. Medical Director ITP review done, changes made as directed, and signed approval by Medical Director.               Comments:

## 2023-09-21 ENCOUNTER — Other Ambulatory Visit: Payer: Self-pay | Admitting: Emergency Medicine

## 2023-09-21 ENCOUNTER — Encounter: Payer: Medicare Other | Admitting: *Deleted

## 2023-09-21 DIAGNOSIS — D860 Sarcoidosis of lung: Secondary | ICD-10-CM

## 2023-09-21 DIAGNOSIS — D869 Sarcoidosis, unspecified: Secondary | ICD-10-CM | POA: Diagnosis not present

## 2023-09-21 NOTE — Progress Notes (Signed)
Daily Session Note  Patient Details  Name: Megan Daniels MRN: [id number] Date of Birth: March 02, 1951 Referring Provider:   Flowsheet Row Pulmonary Rehab from 06/28/2023 in [facility] Cardiac and Pulmonary Rehab  Referring Provider Vida Rigger, MD       Encounter Date: 09/21/2023  Check In:  Session Check In - 09/21/23 1002       Check-In   Supervising physician immediately available to respond to emergencies See telemetry face sheet for immediately available ER MD    Location [facility]-Cardiac & Pulmonary Rehab    Staff Present Cora Collum, RN, BSN, CCRP;Meredith Jewel Baize RN,BSN;[employer], MS, Exercise Physiologist    Virtual Visit No    Medication changes reported     No    Fall or balance concerns reported    No    Warm-up and Cool-down Performed on first and last piece of equipment    Resistance Training Performed Yes    VAD Patient? No    PAD/SET Patient? No      Pain Assessment   Currently in Pain? No/denies                Social History   Tobacco Use  Smoking Status Never  Smokeless Tobacco Never    Goals Met:  Proper associated with RPD/PD & O2 Sat Independence with exercise equipment Exercise tolerated well No report of concerns or symptoms today  Goals Unmet:  Not Applicable  Comments: Pt able to follow exercise prescription today without complaint.  Will continue to monitor for progression.    Dr. Bethann Punches is Medical Director for [facility] Cardiac Rehabilitation.  Dr. Vida Rigger is Medical Director for [facility] Pulmonary Rehabilitation.

## 2023-09-22 ENCOUNTER — Encounter: Payer: Medicare Other | Admitting: *Deleted

## 2023-09-22 DIAGNOSIS — D869 Sarcoidosis, unspecified: Secondary | ICD-10-CM | POA: Diagnosis not present

## 2023-09-22 NOTE — Progress Notes (Signed)
Daily Session Note  Patient Details  Name: Megan Daniels MRN: [id number] Date of Birth: 05-09-1951 Referring Provider:   Flowsheet Row Pulmonary Rehab from 06/28/2023 in [facility] Cardiac and Pulmonary Rehab  Referring Provider Vida Rigger, MD       Encounter Date: 09/22/2023  Check In:  Session Check In - 09/22/23 0932       Check-In   Supervising physician immediately available to respond to emergencies See telemetry face sheet for immediately available ER MD    Location [facility]-Cardiac & Pulmonary Rehab    Staff Present Bess Kinds RN,BSN;Joseph [facility] [place], [place], Exercise Physiologist    Virtual Visit No    Medication changes reported     No    Fall or balance concerns reported    No    Warm-up and Cool-down Performed on first and last piece of equipment    Resistance Training Performed Yes    VAD Patient? No    PAD/SET Patient? No      Pain Assessment   Currently in Pain? No/denies                Social History   Tobacco Use  Smoking Status Never  Smokeless Tobacco Never    Goals Met:  Proper associated with RPD/PD & O2 Sat Independence with exercise equipment Exercise tolerated well No report of concerns or symptoms today Strength training completed today  Goals Unmet:  Not Applicable  Comments: Pt able to follow exercise prescription today without complaint.  Will continue to monitor for progression.    Dr. Bethann Punches is Medical Director for [facility] Cardiac Rehabilitation.  Dr. Vida Rigger is Medical Director for [facility] Pulmonary Rehabilitation.

## 2023-09-25 ENCOUNTER — Ambulatory Visit
Admission: RE | Admit: 2023-09-25 | Discharge: 2023-09-25 | Disposition: A | Discharge status: Home / Self Care | Payer: Medicare Other | Source: Ambulatory Visit | Attending: Emergency Medicine | Admitting: Emergency Medicine

## 2023-09-25 DIAGNOSIS — D860 Sarcoidosis of lung: Secondary | ICD-10-CM | POA: Insufficient documentation

## 2023-09-26 DIAGNOSIS — D869 Sarcoidosis, unspecified: Secondary | ICD-10-CM | POA: Diagnosis not present

## 2023-09-26 NOTE — Progress Notes (Signed)
Daily Session Note  Patient Details  Name: Megan Daniels MRN: [id number] Date of Birth: 04/22/1951 Referring Provider:   Flowsheet Row Pulmonary Rehab from 06/28/2023 in [facility] Cardiac and Pulmonary Rehab  Referring Provider Vida Rigger, MD       Encounter Date: 09/26/2023  Check In:  Session Check In - 09/26/23 0910       Check-In   Supervising physician immediately available to respond to emergencies See telemetry face sheet for immediately available ER MD    Location [facility]-Cardiac & Pulmonary Rehab    Staff Present Kelton Pillar RN,BSN,MPA;Margaret Best, MS, Exercise Physiologist;Maxon Conetta BS, Exercise Physiologist;Noah Tickle, BS, Exercise Physiologist    Virtual Visit No    Medication changes reported     No    Fall or balance concerns reported    No    Warm-up and Cool-down Performed on first and last piece of equipment    Resistance Training Performed Yes    VAD Patient? No    PAD/SET Patient? No      Pain Assessment   Currently in Pain? No/denies                Social History   Tobacco Use  Smoking Status Never  Smokeless Tobacco Never    Goals Met:  Independence with exercise equipment Exercise tolerated well No report of concerns or symptoms today Strength training completed today  Goals Unmet:  Not Applicable  Comments: Pt able to follow exercise prescription today without complaint.  Will continue to monitor for progression.    Dr. Bethann Punches is Medical Director for [facility] Cardiac Rehabilitation.  Dr. Vida Rigger is Medical Director for [facility] Pulmonary Rehabilitation.

## 2023-09-28 ENCOUNTER — Encounter: Payer: Medicare Other | Admitting: *Deleted

## 2023-09-28 DIAGNOSIS — D869 Sarcoidosis, unspecified: Secondary | ICD-10-CM | POA: Diagnosis not present

## 2023-09-28 NOTE — Progress Notes (Signed)
Daily Session Note  Patient Details  Name: Megan Daniels MRN: [id number] Date of Birth: 02-14-1951 Referring Provider:   Flowsheet Row Pulmonary Rehab from 06/28/2023 in [facility] Cardiac and Pulmonary Rehab  Referring Provider Vida Rigger, MD       Encounter Date: 09/28/2023  Check In:  Session Check In - 09/28/23 0933       Check-In   Supervising physician immediately available to respond to emergencies See telemetry face sheet for immediately available ER MD    Location [facility]-Cardiac & Pulmonary Rehab    Staff Present Rory Percy, MS, Exercise Physiologist;Jason Wallace Cullens RDN,LDN;Kaisyn Reinhold, RN, BSN, CCRP    Virtual Visit No    Medication changes reported     No    Fall or balance concerns reported    No    Warm-up and Cool-down Performed on first and last piece of equipment    Resistance Training Performed Yes    VAD Patient? No    PAD/SET Patient? No      Pain Assessment   Currently in Pain? No/denies                Social History   Tobacco Use  Smoking Status Never  Smokeless Tobacco Never    Goals Met:  Proper associated with RPD/PD & O2 Sat Independence with exercise equipment Exercise tolerated well No report of concerns or symptoms today  Goals Unmet:  Not Applicable  Comments: Pt able to follow exercise prescription today without complaint.  Will continue to monitor for progression.    Dr. Bethann Punches is Medical Director for [facility] Cardiac Rehabilitation.  Dr. Vida Rigger is Medical Director for [facility] Pulmonary Rehabilitation.

## 2023-09-29 ENCOUNTER — Encounter: Payer: Medicare Other | Admitting: *Deleted

## 2023-09-29 DIAGNOSIS — D869 Sarcoidosis, unspecified: Secondary | ICD-10-CM | POA: Diagnosis not present

## 2023-09-29 NOTE — Progress Notes (Signed)
Daily Session Note  Patient Details  Name: Megan Daniels MRN: [id number] Date of Birth: [birth year] Referring Provider:   Flowsheet Row Pulmonary Rehab from 06/28/2023 in [facility] Cardiac and Pulmonary Rehab  Referring Provider Vida Rigger, MD       Encounter Date: 09/29/2023  Check In:  Session Check In - 09/29/23 0929       Check-In   Supervising physician immediately available to respond to emergencies See telemetry face sheet for immediately available ER MD    Location [facility]-Cardiac & Pulmonary Rehab    Staff Present Cora Collum, RN, BSN, CCRP;Joseph Hood RCP,RRT,BSRT;Noah Tickle, [place], Exercise Physiologist    Virtual Visit No    Medication changes reported     No    Fall or balance concerns reported    No    Warm-up and Cool-down Performed on first and last piece of equipment    Resistance Training Performed Yes    VAD Patient? No    PAD/SET Patient? No      Pain Assessment   Currently in Pain? No/denies                Social History   Tobacco Use  Smoking Status Never  Smokeless Tobacco Never    Goals Met:  Proper associated with RPD/PD & O2 Sat Independence with exercise equipment Exercise tolerated well No report of concerns or symptoms today  Goals Unmet:  Not Applicable  Comments: Pt able to follow exercise prescription today without complaint.  Will continue to monitor for progression.    Dr. Bethann Punches is Medical Director for [facility] Cardiac Rehabilitation.  Dr. Vida Rigger is Medical Director for [facility] Pulmonary Rehabilitation.

## 2023-10-05 ENCOUNTER — Encounter: Payer: Medicare Other | Admitting: *Deleted

## 2023-10-05 VITALS — Ht 62.09 in | Wt 175.1 lb

## 2023-10-05 DIAGNOSIS — D869 Sarcoidosis, unspecified: Secondary | ICD-10-CM | POA: Diagnosis not present

## 2023-10-05 NOTE — Patient Instructions (Signed)
Discharge Patient Instructions  Patient Details  Name: Megan Daniels MRN: [id number] Date of Birth: [birth year] Referring Provider:  Marguarite Arbour, MD   Number of Visits: 62  Reason for Discharge:  Patient reached a stable level of exercise. Patient independent in their exercise. Patient has met program and personal goals.   Diagnosis:  No diagnosis found.  Initial Exercise Prescription:  Initial Exercise Prescription - 06/28/23 1500       Date of Initial Exercise RX and Referring Provider   Date 06/28/23    Referring Provider Vida Rigger, MD      Oxygen   Maintain Oxygen Saturation 88% or higher      Recumbant Bike   Level 1    RPM 50    Watts 15    Minutes 15    METs 1.77      NuStep   Level 1    SPM 80    Minutes 15    METs 1.77      Arm Ergometer   Level 1    RPM 30    Minutes 15    METs 1.77      Biostep-RELP   Level 1    SPM 50    Minutes 15    METs 1.77      Track   Laps 14    Minutes 15    METs 1.76      Prescription Details   Frequency (times per week) 3    Duration Progress to 30 minutes of continuous aerobic without signs/symptoms of physical distress      Intensity   THRR 40-80% of Max Heartrate 102-132    Ratings of Perceived Exertion 11-13    Perceived Dyspnea 0-4      Progression   Progression Continue to progress workloads to maintain intensity without signs/symptoms of physical distress.      Resistance Training   Training Prescription Yes    Weight 2 lb    Reps 10-15             Discharge Exercise Prescription (Final Exercise Prescription Changes):  Exercise Prescription Changes - 09/27/23 0900       Response to Exercise   Blood Pressure (Admit) 108/66    Blood Pressure (Exit) 104/56    Heart Rate (Admit) 66 bpm    Heart Rate (Exercise) 103 bpm    Heart Rate (Exit) 74 bpm    Oxygen Saturation (Admit) 95 %    Oxygen Saturation (Exercise) 92 %    Oxygen Saturation (Exit) 93 %    Rating of  Perceived Exertion (Exercise) 15    Perceived Dyspnea (Exercise) 2    Symptoms none    Duration Continue with 30 min of aerobic exercise without signs/symptoms of physical distress.    Intensity THRR unchanged      Progression   Progression Continue to progress workloads to maintain intensity without signs/symptoms of physical distress.    Average METs 2.7      Resistance Training   Training Prescription Yes    Weight 3 lb    Reps 10-15      Interval Training   Interval Training No      Treadmill   MPH 2.2    Grade 0    Minutes 15    METs 2.84      Recumbant Bike   Level 1    Watts 19    Minutes 15    METs 2.75      NuStep  Level 3    Minutes 15    METs 3.3      Home Exercise Plan   Plans to continue exercise at Home (comment)   Recumbent at home, walking outside (weather permitting)   Frequency Add 2 additional days to program exercise sessions.    Initial Home Exercises Provided 08/10/23      Oxygen   Maintain Oxygen Saturation 88% or higher             Functional Capacity:  6 Minute Walk     Row Name 06/28/23 1525 10/05/23 0938       6 Minute Walk   Phase Initial Discharge    Distance 1040 feet 1390 feet    Distance % Change -- 33.6 %    Distance Feet Change -- 350 ft    Walk Time 6 minutes 6 minutes    # of Rest Breaks 0 0    MPH 1.97 2.63    METS 1.77 2.68    RPE 13 15    Perceived Dyspnea  2 2    VO2 Peak 6.19 --    Symptoms Yes (comment) Yes (comment)    Comments Bilateral hip pain Chest pain 4/10 resolved after the test concluded    Resting HR 73 bpm 72 bpm    Resting BP 112/66 --    Resting Oxygen Saturation  94 % 95 %    Exercise Oxygen Saturation  during 6 min walk 91 % 93 %    Max Ex. HR 90 bpm 106 bpm    Max Ex. BP 126/66 138/64    2 Minute Post BP 110/66 120/64      Interval HR   1 Minute HR 67 78    2 Minute HR 89 98    3 Minute HR 90 99    4 Minute HR 90 97    5 Minute HR 90 100    6 Minute HR 89 97    2 Minute Post  HR 66 67    Interval Heart Rate? Yes Yes      Interval Oxygen   Interval Oxygen? Yes Yes    Baseline Oxygen Saturation % 94 % 95 %    1 Minute Oxygen Saturation % 93 % 94 %    1 Minute Liters of Oxygen 0 L 0 L    2 Minute Oxygen Saturation % 91 % 93 %    2 Minute Liters of Oxygen 0 L 0 L    3 Minute Oxygen Saturation % 92 % 93 %    3 Minute Liters of Oxygen 0 L 0 L    4 Minute Oxygen Saturation % 92 % 94 %    4 Minute Liters of Oxygen 0 L 0 L    5 Minute Oxygen Saturation % 93 % 93 %    5 Minute Liters of Oxygen 0 L 0 L    6 Minute Oxygen Saturation % 95 % 94 %    6 Minute Liters of Oxygen 0 L 0 L    2 Minute Post Oxygen Saturation % 98 % 95 %    2 Minute Post Liters of Oxygen 0 L 0 L             Nutrition & Weight - Outcomes:  Pre Biometrics - 06/28/23 1537       Pre Biometrics   Height 5' 2.1" (1.577 m)    Weight 182 lb 3.2 oz (82.6 kg)    Waist  Circumference 43 inches    Hip Circumference 45.5 inches    Waist to Hip Ratio 0.95 %    BMI (Calculated) 33.23    Single Leg Stand 12 seconds             Post Biometrics - 10/05/23 0944        Post  Biometrics   Height 5' 2.09" (1.577 m)    Weight 175 lb 1.6 oz (79.4 kg)    Waist Circumference 40.5 inches    Hip Circumference 39.5 inches    Waist to Hip Ratio 1.03 %    BMI (Calculated) 31.94    Single Leg Stand 13.35 seconds             Goals reviewed with patient; copy given to patient.

## 2023-10-05 NOTE — Progress Notes (Signed)
Daily Session Note  Patient Details  Name: Megan Daniels MRN: [id number] Date of Birth: [birth year] Referring Provider:   Flowsheet Row Pulmonary Rehab from 06/28/2023 in [facility] Cardiac and Pulmonary Rehab  Referring Provider Vida Rigger, MD       Encounter Date: 10/05/2023  Check In:  Session Check In - 10/05/23 1008       Check-In   Supervising physician immediately available to respond to emergencies See telemetry face sheet for immediately available ER MD    Location [facility]-Cardiac & Pulmonary Rehab    Staff Present Cora Collum, RN, BSN, CCRP;Maxon Conetta BS, Exercise Physiologist;Joseph Hood RCP,RRT,BSRT;Noah Tickle, BS, Exercise Physiologist    Virtual Visit No    Medication changes reported     No    Fall or balance concerns reported    No    Warm-up and Cool-down Performed on first and last piece of equipment    Resistance Training Performed Yes    VAD Patient? No    PAD/SET Patient? No      Pain Assessment   Currently in Pain? No/denies              6 Minute Walk     Row Name 06/28/23 1525 10/05/23 0938       6 Minute Walk   Phase Initial Discharge    Distance 1040 feet 1390 feet    Distance % Change -- 33.6 %    Distance Feet Change -- 350 ft    Walk Time 6 minutes 6 minutes    # of Rest Breaks 0 0    MPH 1.97 2.63    METS 1.77 2.68    RPE 13 15    Perceived Dyspnea  2 2    VO2 Peak 6.19 --    Symptoms Yes (comment) Yes (comment)    Comments Bilateral hip pain Chest pain 4/10 resolved after the test concluded    Resting HR 73 bpm 72 bpm    Resting BP 112/66 --    Resting Oxygen Saturation  94 % 95 %    Exercise Oxygen Saturation  during 6 min walk 91 % 93 %    Max Ex. HR 90 bpm 106 bpm    Max Ex. BP 126/66 138/64    2 Minute Post BP 110/66 120/64      Interval HR   1 Minute HR 67 78    2 Minute HR 89 98    3 Minute HR 90 99    4 Minute HR 90 97    5 Minute HR 90 100    6 Minute HR 89 97    2 Minute Post HR 66 67    Interval Heart  Rate? Yes Yes      Interval Oxygen   Interval Oxygen? Yes Yes    Baseline Oxygen Saturation % 94 % 95 %    1 Minute Oxygen Saturation % 93 % 94 %    1 Minute Liters of Oxygen 0 L 0 L    2 Minute Oxygen Saturation % 91 % 93 %    2 Minute Liters of Oxygen 0 L 0 L    3 Minute Oxygen Saturation % 92 % 93 %    3 Minute Liters of Oxygen 0 L 0 L    4 Minute Oxygen Saturation % 92 % 94 %    4 Minute Liters of Oxygen 0 L 0 L    5 Minute Oxygen Saturation % 93 % 93 %  5 Minute Liters of Oxygen 0 L 0 L    6 Minute Oxygen Saturation % 95 % 94 %    6 Minute Liters of Oxygen 0 L 0 L    2 Minute Post Oxygen Saturation % 98 % 95 %    2 Minute Post Liters of Oxygen 0 L 0 L                Social History   Tobacco Use  Smoking Status Never  Smokeless Tobacco Never    Goals Met:  Proper associated with RPD/PD & O2 Sat Independence with exercise equipment Exercise tolerated well No report of concerns or symptoms today  Goals Unmet:  Not Applicable  Comments: Pt able to follow exercise prescription today without complaint.  Will continue to monitor for progression.    Dr. Bethann Punches is Medical Director for [facility] Cardiac Rehabilitation.  Dr. Vida Rigger is Medical Director for [facility] Pulmonary Rehabilitation.

## 2023-10-10 ENCOUNTER — Encounter: Payer: Medicare Other | Attending: Pulmonary Disease | Admitting: *Deleted

## 2023-10-10 DIAGNOSIS — D869 Sarcoidosis, unspecified: Secondary | ICD-10-CM | POA: Diagnosis present

## 2023-10-10 DIAGNOSIS — D860 Sarcoidosis of lung: Secondary | ICD-10-CM | POA: Insufficient documentation

## 2023-10-10 NOTE — Progress Notes (Signed)
Daily Session Note  Patient Details  Name: Megan Daniels MRN: [id number] Date of Birth: 07/11/1951 Referring Provider:   Flowsheet Row Pulmonary Rehab from 06/28/2023 in [facility] Cardiac and Pulmonary Rehab  Referring Provider Vida Rigger, MD       Encounter Date: 10/10/2023  Check In:  Session Check In - 10/10/23 0956       Check-In   Supervising physician immediately available to respond to emergencies See telemetry face sheet for immediately available ER MD    Location [facility]-Cardiac & Pulmonary Rehab    Staff Present Rory Percy, MS, Exercise Physiologist;Sagan Maselli, RN, BSN, CCRP;Maxon Conetta BS, Exercise Physiologist;Noah Tickle, BS, Exercise Physiologist    Virtual Visit No    Medication changes reported     No    Fall or balance concerns reported    No    Warm-up and Cool-down Performed on first and last piece of equipment    Resistance Training Performed Yes    VAD Patient? No    PAD/SET Patient? No      Pain Assessment   Currently in Pain? No/denies                Social History   Tobacco Use  Smoking Status Never  Smokeless Tobacco Never    Goals Met:  Proper associated with RPD/PD & O2 Sat Independence with exercise equipment Exercise tolerated well No report of concerns or symptoms today  Goals Unmet:  Not Applicable  Comments: Pt able to follow exercise prescription today without complaint.  Will continue to monitor for progression.     Dr. Bethann Punches is Medical Director for [facility] Cardiac Rehabilitation.  Dr. Vida Rigger is Medical Director for [facility] Pulmonary Rehabilitation.

## 2023-10-12 ENCOUNTER — Encounter: Payer: Medicare Other | Admitting: *Deleted

## 2023-10-12 DIAGNOSIS — D860 Sarcoidosis of lung: Secondary | ICD-10-CM | POA: Diagnosis not present

## 2023-10-12 DIAGNOSIS — D869 Sarcoidosis, unspecified: Secondary | ICD-10-CM

## 2023-10-12 NOTE — Progress Notes (Signed)
 Daily Session Note  Patient Details  Name: SKYLINN VIALPANDO MRN: [id number] Date of Birth: 03-Oct-1950 Referring Provider:   Flowsheet Row Pulmonary Rehab from 06/28/2023 in [facility] Cardiac and Pulmonary Rehab  Referring Provider Parris Manna, MD       Encounter Date: 10/12/2023  Check In:  Session Check In - 10/12/23 0939       Check-In   Supervising physician immediately available to respond to emergencies See telemetry face sheet for immediately available ER MD    Location [facility]-Cardiac & Pulmonary Rehab    Staff Present Othel Durand, RN, BSN, CCRP;Maxon Conetta BS, Exercise Physiologist;Joseph Hood RCP,RRT,BSRT;Noah Tickle, [place], Exercise Physiologist    Virtual Visit No    Medication changes reported     No    Fall or balance concerns reported    No    Warm-up and Cool-down Performed on first and last piece of equipment    Resistance Training Performed Yes    VAD Patient? No    PAD/SET Patient? No      Pain Assessment   Currently in Pain? No/denies                Social History   Tobacco Use  Smoking Status Never  Smokeless Tobacco Never    Goals Met:  Proper associated with RPD/PD & O2 Sat Independence with exercise equipment Exercise tolerated well No report of concerns or symptoms today  Goals Unmet:  Not Applicable  Comments: Pt able to follow exercise prescription today without complaint.  Will continue to monitor for progression.    Dr. Oneil Pinal is Medical Director for [facility] Cardiac Rehabilitation.  Dr. Fuad Aleskerov is Medical Director for [facility] Pulmonary Rehabilitation.

## 2023-10-13 ENCOUNTER — Encounter: Payer: Medicare Other | Admitting: *Deleted

## 2023-10-13 DIAGNOSIS — D869 Sarcoidosis, unspecified: Secondary | ICD-10-CM

## 2023-10-13 DIAGNOSIS — D860 Sarcoidosis of lung: Secondary | ICD-10-CM | POA: Diagnosis not present

## 2023-10-13 NOTE — Progress Notes (Signed)
 Daily Session Note  Patient Details  Name: Megan Daniels MRN: [id number] Date of Birth: 1951-06-30 Referring Provider:   Flowsheet Row Pulmonary Rehab from 06/28/2023 in [facility] Cardiac and Pulmonary Rehab  Referring Provider Parris Manna, MD       Encounter Date: 10/13/2023  Check In:  Session Check In - 10/13/23 0930       Check-In   Supervising physician immediately available to respond to emergencies See telemetry face sheet for immediately available ER MD    Location [facility]-Cardiac & Pulmonary Rehab    Staff Present Othel Durand, RN, BSN, CCRP;Noah Tickle, BS, Exercise Physiologist;Joseph Hood RCP,RRT,BSRT    Virtual Visit No    Medication changes reported     No    Fall or balance concerns reported    No    Warm-up and Cool-down Performed on first and last piece of equipment    Resistance Training Performed Yes    VAD Patient? No    PAD/SET Patient? No      Pain Assessment   Currently in Pain? No/denies                Social History   Tobacco Use  Smoking Status Never  Smokeless Tobacco Never    Goals Met:  Proper associated with RPD/PD & O2 Sat Independence with exercise equipment Exercise tolerated well No report of concerns or symptoms today  Goals Unmet:  Not Applicable  Comments: Pt able to follow exercise prescription today without complaint.  Will continue to monitor for progression.    Dr. Oneil Pinal is Medical Director for [facility] Cardiac Rehabilitation.  Dr. Fuad Aleskerov is Medical Director for [facility] Pulmonary Rehabilitation.

## 2023-10-17 ENCOUNTER — Encounter: Payer: Medicare Other | Admitting: *Deleted

## 2023-10-17 DIAGNOSIS — D860 Sarcoidosis of lung: Secondary | ICD-10-CM | POA: Diagnosis not present

## 2023-10-17 DIAGNOSIS — D869 Sarcoidosis, unspecified: Secondary | ICD-10-CM

## 2023-10-17 NOTE — Progress Notes (Signed)
Daily Session Note  Patient Details  Name: Megan Daniels MRN: [id number] Date of Birth: 1951/07/05 Referring Provider:   Flowsheet Row Pulmonary Rehab from 06/28/2023 in [facility] Cardiac and Pulmonary Rehab  Referring Provider Vida Rigger, MD       Encounter Date: 10/17/2023  Check In:  Session Check In - 10/17/23 0926       Check-In   Supervising physician immediately available to respond to emergencies See telemetry face sheet for immediately available ER MD    Location [facility]-Cardiac & Pulmonary Rehab    Staff Present Rory Percy, MS, Exercise Physiologist;Maxon Conetta BS, Exercise Physiologist;Estrellita Lasky, RN, BSN, CCRP;Noah Tickle, BS, Exercise Physiologist    Virtual Visit No    Medication changes reported     No    Fall or balance concerns reported    No    Warm-up and Cool-down Performed on first and last piece of equipment    Resistance Training Performed Yes    VAD Patient? No    PAD/SET Patient? No      Pain Assessment   Currently in Pain? No/denies                Social History   Tobacco Use  Smoking Status Never  Smokeless Tobacco Never    Goals Met:  Proper associated with RPD/PD & O2 Sat Independence with exercise equipment Exercise tolerated well No report of concerns or symptoms today  Goals Unmet:  Not Applicable  Comments: Pt able to follow exercise prescription today without complaint.  Will continue to monitor for progression.    Dr. Bethann Punches is Medical Director for [facility] Cardiac Rehabilitation.  Dr. Vida Rigger is Medical Director for [facility] Pulmonary Rehabilitation.

## 2023-10-18 ENCOUNTER — Encounter: Payer: Self-pay | Admitting: *Deleted

## 2023-10-18 DIAGNOSIS — D869 Sarcoidosis, unspecified: Secondary | ICD-10-CM

## 2023-10-18 NOTE — Progress Notes (Signed)
Pulmonary Individual Treatment Plan  Patient Details  Name: AVELYN TOUCH MRN: [id number] Date of Birth: 05-26-1951 Referring Provider:   Flowsheet Row Pulmonary Rehab from 06/28/2023 in [facility] Re[facility]  Referring Provider Vida Rigger, MD       Initial Encounter Date:  Flowsheet Row Pulmonary Rehab from 06/28/2023 in [facility] Cardiac and Pulmonary Rehab  Date 06/28/23       Visit Diagnosis: Sarcoidosis  Patient's Home Medications on Admission:  Current Outpatient Medications:    acetaminophen (TYLENOL) 500 MG tablet, Take 1,000 mg by mouth every 6 (six) hours as needed for moderate pain or headache., Disp: , Rfl:    albuterol (VENTOLIN HFA) 108 (90 Base) MCG/ACT inhaler, Inhale 2 puffs into the lungs every 4 (four) hours as needed for wheezing or shortness of breath.  (Patient not taking: Reported on 11/25/2022), Disp: , Rfl:    albuterol (VENTOLIN HFA) 108 (90 Base) MCG/ACT inhaler, Inhale into the lungs., Disp: , Rfl:    amLODipine (NORVASC) 5 MG tablet, Take 5 mg by mouth daily., Disp: , Rfl:    aspirin EC 81 MG tablet, Take 1 tablet (81 mg total) by mouth daily. (Patient taking differently: Take 81 mg by mouth every evening.), Disp: , Rfl:    Black Elderberry (SAMBUCUS ELDERBERRY) 50 MG/5ML SYRP, Take by mouth., Disp: , Rfl:    busPIRone (BUSPAR) 10 MG tablet, Take 10 mg by mouth 2 (two) times daily. (Patient not taking: Reported on 06/21/2023), Disp: , Rfl:    carvedilol (COREG) 12.5 MG tablet, Take 12.5 mg by mouth., Disp: , Rfl:    carvedilol (COREG) 12.5 MG tablet, Take by mouth., Disp: , Rfl:    cephALEXin (KEFLEX) 500 MG capsule, Take 500 mg by mouth 3 (three) times daily. (Patient not taking: Reported on 06/21/2023), Disp: , Rfl:    cetirizine (ZYRTEC) 10 MG chewable tablet, Chew by mouth., Disp: , Rfl:    cetirizine (ZYRTEC) 10 MG tablet, Take by mouth. (Patient not taking: Reported on 06/21/2023), Disp: , Rfl:    Cholecalciferol (VITAMIN D) 2000  units CAPS, Take 2,000 Units by mouth daily. , Disp: , Rfl:    Cholecalciferol (VITAMIN D3) 50 MCG (2000 UT) capsule, Take by mouth., Disp: , Rfl:    Collagen Matrix, Porcine, 2 X 2 X 2 CM MISC, Use 1,000 mg once daily, Disp: , Rfl:    cyanocobalamin (VITAMIN B12) 500 MCG tablet, Take by mouth., Disp: , Rfl:    docusate sodium (COLACE) 100 MG capsule, Take 100 mg by mouth 2 (two) times daily. , Disp: , Rfl:    DULoxetine (CYMBALTA) 30 MG capsule, Take by mouth., Disp: , Rfl:    famotidine (PEPCID) 40 MG tablet, Take by mouth., Disp: , Rfl:    fluticasone (FLONASE) 50 MCG/ACT nasal spray, Place into the nose., Disp: , Rfl:    folic acid (FOLVITE) 1 MG tablet, Take by mouth., Disp: , Rfl:    gabapentin (NEURONTIN) 300 MG capsule, Take by mouth. (Patient not taking: Reported on 06/21/2023), Disp: , Rfl:    gabapentin (NEURONTIN) 300 MG capsule, Take by mouth., Disp: , Rfl:    irbesartan (AVAPRO) 300 MG tablet, TAKE 1 TABLET BY MOUTH DAILY, Disp: , Rfl:    irbesartan (AVAPRO) 300 MG tablet, Take by mouth. (Patient not taking: Reported on 06/21/2023), Disp: , Rfl:    losartan (COZAAR) 100 MG tablet, Take 100 mg by mouth at bedtime.  (Patient not taking: Reported on 11/25/2022), Disp: , Rfl:  magnesium oxide (MAG-OX) 400 MG tablet, Take by mouth., Disp: , Rfl:    metaxalone (SKELAXIN) 800 MG tablet, Take by mouth., Disp: , Rfl:    metoprolol succinate (TOPROL-XL) 25 MG 24 hr tablet, Take 25 mg by mouth 2 (two) times daily.  (Patient not taking: Reported on 11/25/2022), Disp: , Rfl:    Multiple Vitamin (MULTIVITAMIN WITH MINERALS) TABS tablet, Take 1 tablet by mouth daily., Disp: , Rfl:    omeprazole (PRILOSEC) 40 MG capsule, TAKE 1 CAPSULE DAILY BEFORE BREAKFAST, Disp: 90 capsule, Rfl: 3   omeprazole (PRILOSEC) 40 MG capsule, Take by mouth. (Patient not taking: Reported on 06/21/2023), Disp: , Rfl:    predniSONE (DELTASONE) 2.5 MG tablet, Take by mouth. (Patient not taking: Reported on 06/21/2023),  Disp: , Rfl:    sulfamethoxazole-trimethoprim (BACTRIM) 400-80 MG tablet, Take 1 tablet by mouth 3 (three) times a week. (Patient not taking: Reported on 06/21/2023), Disp: , Rfl:    topiramate (TOPAMAX) 50 MG tablet, Take 50 mg by mouth 2 (two) times daily., Disp: , Rfl:    vitamin B-12 (CYANOCOBALAMIN) 500 MCG tablet, Take 500 mcg by mouth daily. , Disp: , Rfl:    vitamin C (ASCORBIC ACID) 500 MG tablet, Take 500 mg by mouth daily., Disp: , Rfl:    Vitamins/Minerals TABS, Take by mouth., Disp: , Rfl:   Past Medical History: Past Medical History:  Diagnosis Date   Abnormal Pap smear of cervix    pos hpv-    Acid reflux    Anxiety    Arthritis    Asthma    Symbicort as needed   Cancer (HCC)    Basal Cell Skin Cancer   Chalazion of left eye    Chronic back pain    spondylolisthesis/stenosis   Chronic UTI    was treated for 3 months with Macrobid and in Dec everything was fine   Constipation    COPD (chronic obstructive pulmonary disease) (HCC)    Diverticulosis    Elevated serum creatinine    with nsaids   Foot drop    right   Genital warts    h/o   GERD (gastroesophageal reflux disease)    takes Prevacid daily   Headache    History of bronchitis    History of colon polyps    History of hiatal hernia    Hx of vertigo    Hypertension    takes Losartan daily   IBS (irritable bowel syndrome)    Joint pain    Palpitations    takes Metoprolol daily   Pneumonia    hx of-early 2000's   PONV (postoperative nausea and vomiting)    extreme nausea and vomiting -none last surgery 10/29/14   Sarcoidosis    Seasonal allergies    takes Claritin daily   Shortness of breath dyspnea    Sleep apnea    cpap 10 yrs   Stress incontinence    Thyroid nodule    URI (upper respiratory infection)    treated self with Mucinex and no fever   Urinary urgency    Vitamin D deficiency    Weakness    numbness and tingling in both legs    Tobacco Use: Social History   Tobacco Use   Smoking Status Never  Smokeless Tobacco Never    Labs: Review Flowsheet        No data to display           Pulmonary Assessment Scores:  Pulmonary Assessment Scores  Row Name 06/28/23 1537 10/10/23 1001       ADL UCSD   SOB Score total 30 34    Rest 0 0    Walk 0 0    Stairs 1 3    Bath 1 0    Dress 1 0    Shop 1 1      CAT Score   CAT Score 16 13      mMRC Score   mMRC Score 1 1             UCSD: Self-administered rating of dyspnea associated with activities of daily living (ADLs) 6-point scale (0 = "not at all" to 5 = "maximal or unable to do because of breathlessness")  Scoring Scores range from 0 to 120.  Minimally important difference is 5 units  CAT: CAT can identify the health impairment of COPD patients and is better correlated with disease progression.  CAT has a scoring range of zero to 40. The CAT score is classified into four groups of low (less than 10), medium (10 - 20), high (21-30) and very high (31-40) based on the impact level of disease on health status. A CAT score over 10 suggests significant symptoms.  A worsening CAT score could be explained by an exacerbation, poor medication adherence, poor inhaler technique, or progression of COPD or comorbid conditions.  CAT MCID is 2 points  mMRC: mMRC (Modified Medical Research Council) Dyspnea Scale is used to assess the degree of baseline functional disability in patients of respiratory disease due to dyspnea. No minimal important difference is established. A decrease in score of 1 point or greater is considered a positive change.   Pulmonary Function Assessment:  Pulmonary Function Assessment - 06/21/23 1338       Breath   Shortness of Breath Yes;Limiting activity             Exercise Target Goals: Exercise Program Goal: Individual exercise prescription set using results from initial 6 min walk test and THRR while considering  patient's activity barriers and safety.    Exercise Prescription Goal: Initial exercise prescription builds to 30-45 minutes a day of aerobic activity, 2-3 days per week.  Home exercise guidelines will be given to patient during program as part of exercise prescription that the participant will acknowledge.  Education: Aerobic Exercise: - Group verbal and visual presentation on the components of exercise prescription. Introduces F.I.T.T principle from ACSM for exercise prescriptions.  Reviews F.I.T.T. principles of aerobic exercise including progression. Written material given at graduation.   Education: Resistance Exercise: - Group verbal and visual presentation on the components of exercise prescription. Introduces F.I.T.T principle from ACSM for exercise prescriptions  Reviews F.I.T.T. principles of resistance exercise including progression. Written material given at graduation.    Education: Exercise & Equipment Safety: - Individual verbal instruction and demonstration of equipment use and safety with use of the equipment. Flowsheet Row Pulmonary Rehab from 08/17/2023 in [facility] Cardiac and Pulmonary Rehab  Date 06/28/23  Educator MB  Instruction Review Code 1- Verbalizes Understanding       Education: Exercise Physiology & General Exercise Guidelines: - Group verbal and written instruction with models to review the exercise physiology of the cardiovascular system and associated critical values. Provides general exercise guidelines with specific guidelines to those with heart or lung disease.    Education: Flexibility, Balance, Mind/Body Relaxation: - Group verbal and visual presentation with interactive activity on the components of exercise prescription. Introduces F.I.T.T principle from ACSM for exercise prescriptions.  Reviews F.I.T.T. principles of flexibility and balance exercise training including progression. Also discusses the mind body connection.  Reviews various relaxation techniques to help reduce and manage stress  (i.e. Deep breathing, progressive muscle relaxation, and visualization). Balance handout provided to take home. Written material given at graduation.   Activity Barriers & Risk Stratification:  Activity Barriers & Cardiac Risk Stratification - 06/28/23 1528       Activity Barriers & Cardiac Risk Stratification   Activity Barriers Back Problems;Shortness of Breath             6 Minute Walk:  6 Minute Walk     Row Name 06/28/23 1525 10/05/23 0938       6 Minute Walk   Phase Initial Discharge    Distance 1040 feet 1390 feet    Distance % Change -- 33.6 %    Distance Feet Change -- 350 ft    Walk Time 6 minutes 6 minutes    # of Rest Breaks 0 0    MPH 1.97 2.63    METS 1.77 2.68    RPE 13 15    Perceived Dyspnea  2 2    VO2 Peak 6.19 --    Symptoms Yes (comment) Yes (comment)    Comments Bilateral hip pain Chest pain 4/10 resolved after the test concluded    Resting HR 73 bpm 72 bpm    Resting BP 112/66 --    Resting Oxygen Saturation  94 % 95 %    Exercise Oxygen Saturation  during 6 min walk 91 % 93 %    Max Ex. HR 90 bpm 106 bpm    Max Ex. BP 126/66 138/64    2 Minute Post BP 110/66 120/64      Interval HR   1 Minute HR 67 78    2 Minute HR 89 98    3 Minute HR 90 99    4 Minute HR 90 97    5 Minute HR 90 100    6 Minute HR 89 97    2 Minute Post HR 66 67    Interval Heart Rate? Yes Yes      Interval Oxygen   Interval Oxygen? Yes Yes    Baseline Oxygen Saturation % 94 % 95 %    1 Minute Oxygen Saturation % 93 % 94 %    1 Minute Liters of Oxygen 0 L 0 L    2 Minute Oxygen Saturation % 91 % 93 %    2 Minute Liters of Oxygen 0 L 0 L    3 Minute Oxygen Saturation % 92 % 93 %    3 Minute Liters of Oxygen 0 L 0 L    4 Minute Oxygen Saturation % 92 % 94 %    4 Minute Liters of Oxygen 0 L 0 L    5 Minute Oxygen Saturation % 93 % 93 %    5 Minute Liters of Oxygen 0 L 0 L    6 Minute Oxygen Saturation % 95 % 94 %    6 Minute Liters of Oxygen 0 L 0 L    2  Minute Post Oxygen Saturation % 98 % 95 %    2 Minute Post Liters of Oxygen 0 L 0 L            Oxygen Initial Assessment:  Oxygen Initial Assessment - 06/21/23 1337       Home Oxygen   Home Oxygen Device None  Sleep Oxygen Prescription CPAP    Liters per minute 0    Home Exercise Oxygen Prescription None    Home Resting Oxygen Prescription None    Compliance with Home Oxygen Use Yes      Initial 6 min Walk   Oxygen Used None      Program Oxygen Prescription   Program Oxygen Prescription None      Intervention   Short Term Goals To learn and exhibit compliance with exercise, home and travel O2 prescription;To learn and understand importance of monitoring SPO2 with pulse oximeter and demonstrate accurate use of the pulse oximeter.;To learn and understand importance of maintaining oxygen saturations>88%;To learn and demonstrate proper pursed lip breathing techniques or other breathing techniques. ;To learn and demonstrate proper use of respiratory medications    Long  Term Goals Exhibits compliance with exercise, home  and travel O2 prescription;Verbalizes importance of monitoring SPO2 with pulse oximeter and return demonstration;Maintenance of O2 saturations>88%;Exhibits proper breathing techniques, such as pursed lip breathing or other method taught during program session;Compliance with respiratory medication;Demonstrates proper use of MDI's             Oxygen Re-Evaluation:  Oxygen Re-Evaluation     Row Name 07/06/23 2956 07/25/23 0925 08/17/23 1040 09/05/23 1323 10/12/23 0945     Program Oxygen Prescription   Program Oxygen Prescription -- None None None None     Home Oxygen   Home Oxygen Device -- None None None None   Sleep Oxygen Prescription -- CPAP CPAP CPAP CPAP   Liters per minute -- 0 0 -- 0   Home Exercise Oxygen Prescription -- None None None None   Home Resting Oxygen Prescription -- None None None None   Compliance with Home Oxygen Use -- Yes Yes Yes  Yes     Goals/Expected Outcomes   Short Term Goals -- To learn and exhibit compliance with exercise, home and travel O2 prescription;To learn and understand importance of monitoring SPO2 with pulse oximeter and demonstrate accurate use of the pulse oximeter.;To learn and understand importance of maintaining oxygen saturations>88%;To learn and demonstrate proper pursed lip breathing techniques or other breathing techniques. ;To learn and demonstrate proper use of respiratory medications To learn and exhibit compliance with exercise, home and travel O2 prescription;To learn and understand importance of monitoring SPO2 with pulse oximeter and demonstrate accurate use of the pulse oximeter.;To learn and understand importance of maintaining oxygen saturations>88%;To learn and demonstrate proper pursed lip breathing techniques or other breathing techniques. ;To learn and demonstrate proper use of respiratory medications To learn and exhibit compliance with exercise, home and travel O2 prescription;To learn and understand importance of monitoring SPO2 with pulse oximeter and demonstrate accurate use of the pulse oximeter.;To learn and understand importance of maintaining oxygen saturations>88%;To learn and demonstrate proper pursed lip breathing techniques or other breathing techniques. ;To learn and demonstrate proper use of respiratory medications To learn and exhibit compliance with exercise, home and travel O2 prescription;To learn and understand importance of monitoring SPO2 with pulse oximeter and demonstrate accurate use of the pulse oximeter.;To learn and understand importance of maintaining oxygen saturations>88%;To learn and demonstrate proper pursed lip breathing techniques or other breathing techniques. ;To learn and demonstrate proper use of respiratory medications   Long  Term Goals -- Exhibits compliance with exercise, home  and travel O2 prescription;Verbalizes importance of monitoring SPO2 with pulse  oximeter and return demonstration;Maintenance of O2 saturations>88%;Exhibits proper breathing techniques, such as pursed lip breathing or other method taught during program  session;Compliance with respiratory medication;Demonstrates proper use of MDI's Exhibits compliance with exercise, home  and travel O2 prescription;Verbalizes importance of monitoring SPO2 with pulse oximeter and return demonstration;Maintenance of O2 saturations>88%;Exhibits proper breathing techniques, such as pursed lip breathing or other method taught during program session;Compliance with respiratory medication;Demonstrates proper use of MDI's Exhibits compliance with exercise, home  and travel O2 prescription;Verbalizes importance of monitoring SPO2 with pulse oximeter and return demonstration;Maintenance of O2 saturations>88%;Exhibits proper breathing techniques, such as pursed lip breathing or other method taught during program session;Compliance with respiratory medication;Demonstrates proper use of MDI's Exhibits compliance with exercise, home  and travel O2 prescription;Verbalizes importance of monitoring SPO2 with pulse oximeter and return demonstration;Maintenance of O2 saturations>88%;Exhibits proper breathing techniques, such as pursed lip breathing or other method taught during program session;Compliance with respiratory medication;Demonstrates proper use of MDI's   Comments Reviewed PLB technique with pt.  Talked about how it works and it's importance in maintaining their exercise saturations. Reviewed PLB technique with her. SHowed her how it works and when to use it. Charron is noticing improvement on her breathing since starting the program. She has been practicing her PLB with success. She attended the chronic lung disease review class today and enjoyed it. She reads articles on sarcoidosis attempting to find more information since it isn't discussed much. She plans to ask her doctor more questions at their next appt Akeyla  continues to practice PLB which she believes has helped her breathing in the program. She has experienced some SOB outside of rehab when rushing to get places and walking up inclines. We encouraged her to practice PLB when these situations arise. We also discussed some exercise techniques for her to become more conditioned to walking up inclines. Shaylynn understands when and how to utilize her breathing techniques. She still experiences some SOB when walking uphill, and knows to use breathing techniques to help with those symptoms. Discussed the importance of conditioning to improve her stamina and SOB.   Goals/Expected Outcomes Short: Become more profiecient at using PLB. Long: Become independent at using PLB. Short: practive using PLB when short of breath and looking to breath easier. Long: become independed at using PLB Short: attend pulmonary rehab for exercise and breathing techniques and ask her doctor her questions on her disease. Long: independenlty manage her breathing. Short: Continue to attend pulmonary rehab for exercise and breathing techniques. Long: Independenlty manage SOB. Short: Continue to attend pulmonary rehab for exercise and breathing techniques. Long: Independenlty manage SOB.            Oxygen Discharge (Final Oxygen Re-Evaluation):  Oxygen Re-Evaluation - 10/12/23 0945       Program Oxygen Prescription   Program Oxygen Prescription None      Home Oxygen   Home Oxygen Device None    Sleep Oxygen Prescription CPAP    Liters per minute 0    Home Exercise Oxygen Prescription None    Home Resting Oxygen Prescription None    Compliance with Home Oxygen Use Yes      Goals/Expected Outcomes   Short Term Goals To learn and exhibit compliance with exercise, home and travel O2 prescription;To learn and understand importance of monitoring SPO2 with pulse oximeter and demonstrate accurate use of the pulse oximeter.;To learn and understand importance of maintaining oxygen  saturations>88%;To learn and demonstrate proper pursed lip breathing techniques or other breathing techniques. ;To learn and demonstrate proper use of respiratory medications    Long  Term Goals Exhibits compliance with exercise, home  and travel O2 prescription;Verbalizes importance of monitoring SPO2 with pulse oximeter and return demonstration;Maintenance of O2 saturations>88%;Exhibits proper breathing techniques, such as pursed lip breathing or other method taught during program session;Compliance with respiratory medication;Demonstrates proper use of MDI's    Comments Abella understands when and how to utilize her breathing techniques. She still experiences some SOB when walking uphill, and knows to use breathing techniques to help with those symptoms. Discussed the importance of conditioning to improve her stamina and SOB.    Goals/Expected Outcomes Short: Continue to attend pulmonary rehab for exercise and breathing techniques. Long: Independenlty manage SOB.             Initial Exercise Prescription:  Initial Exercise Prescription - 06/28/23 1500       Date of Initial Exercise RX and Referring Provider   Date 06/28/23    Referring Provider Vida Rigger, MD      Oxygen   Maintain Oxygen Saturation 88% or higher      Recumbant Bike   Level 1    RPM 50    Watts 15    Minutes 15    METs 1.77      NuStep   Level 1    SPM 80    Minutes 15    METs 1.77      Arm Ergometer   Level 1    RPM 30    Minutes 15    METs 1.77      Biostep-RELP   Level 1    SPM 50    Minutes 15    METs 1.77      Track   Laps 14    Minutes 15    METs 1.76      Prescription Details   Frequency (times per week) 3    Duration Progress to 30 minutes of continuous aerobic without signs/symptoms of physical distress      Intensity   THRR 40-80% of Max Heartrate 102-132    Ratings of Perceived Exertion 11-13    Perceived Dyspnea 0-4      Progression   Progression Continue to progress  workloads to maintain intensity without signs/symptoms of physical distress.      Resistance Training   Training Prescription Yes    Weight 2 lb    Reps 10-15             Perform Capillary Blood Glucose checks as needed.  Exercise Prescription Changes:   Exercise Prescription Changes     Row Name 06/28/23 1500 07/20/23 1400 08/01/23 1400 08/10/23 1000 08/17/23 1600     Response to Exercise   Blood Pressure (Admit) 112/66 124/76 104/58 -- 108/60   Blood Pressure (Exercise) 126/66 124/70 144/72 -- --   Blood Pressure (Exit) 110/66 118/68 110/58 -- 108/62   Heart Rate (Admit) 73 bpm 64 bpm 71 bpm -- 61 bpm   Heart Rate (Exercise) 90 bpm 88 bpm 88 bpm -- 92 bpm   Heart Rate (Exit) 66 bpm 65 bpm 74 bpm -- 68 bpm   Oxygen Saturation (Admit) 94 % 95 % 95 % -- 94 %   Oxygen Saturation (Exercise) 91 % 91 % 90 % -- 91 %   Oxygen Saturation (Exit) 98 % 95 % 92 % -- 96 %   Rating of Perceived Exertion (Exercise) 13 13 15  -- 14   Perceived Dyspnea (Exercise) 2 2 3  -- 3   Symptoms bilateral hip pain none -- -- none   Comments results First three days  of exercise -- -- --   Duration Progress to 30 minutes of  aerobic without signs/symptoms of physical distress Progress to 30 minutes of  aerobic without signs/symptoms of physical distress Continue with 30 min of aerobic exercise without signs/symptoms of physical distress. -- Continue with 30 min of aerobic exercise without signs/symptoms of physical distress.   Intensity THRR New THRR unchanged THRR unchanged -- THRR unchanged     Progression   Progression Continue to progress workloads to maintain intensity without signs/symptoms of physical distress. Continue to progress workloads to maintain intensity without signs/symptoms of physical distress. Continue to progress workloads to maintain intensity without signs/symptoms of physical distress. -- Continue to progress workloads to maintain intensity without signs/symptoms of physical  distress.   Average METs 1.77 2.33 2.58 -- 2.71     Resistance Training   Training Prescription -- Yes Yes -- Yes   Weight -- 3 lb 3 lb -- 3 lb   Reps -- 10-15 10-15 -- 10-15     Interval Training   Interval Training -- No No -- No     Recumbant Bike   Level -- 1 2 -- 2   Watts -- 25 25 -- 25   Minutes -- 15 15 -- 15   METs -- 2.95 2.96 -- 2.96     NuStep   Level -- 2 3 -- 4   Minutes -- 30 15 -- 15   METs -- 2.2 2.5 -- 3     Arm Ergometer   Level -- -- 1 -- --   Minutes -- -- 15 -- --   METs -- -- 1.9 -- --     Biostep-RELP   Level -- -- 2 -- 3   Minutes -- -- 15 -- 15   METs -- -- 3 -- 2.8     Track   Laps -- 25 35 -- 15   Minutes -- 15 15 -- 15   METs -- 2.36 2.9 -- 1.82     Home Exercise Plan   Plans to continue exercise at -- -- -- Home (comment)  Recumbent at home, walking outside (weather permitting) Home (comment)  Recumbent at home, walking outside (weather permitting)   Frequency -- -- -- Add 2 additional days to program exercise sessions. Add 2 additional days to program exercise sessions.   Initial Home Exercises Provided -- -- -- 08/10/23 08/10/23     Oxygen   Maintain Oxygen Saturation -- 88% or higher 88% or higher 88% or higher 88% or higher    Row Name 08/29/23 0800 09/13/23 1300 09/27/23 0900 10/10/23 0700       Response to Exercise   Blood Pressure (Admit) 106/54 108/58 108/66 98/58    Blood Pressure (Exit) 102/62 108/66 104/56 110/64    Heart Rate (Admit) 75 bpm 61 bpm 66 bpm 68 bpm    Heart Rate (Exercise) 93 bpm 90 bpm 103 bpm 96 bpm    Heart Rate (Exit) 77 bpm 76 bpm 74 bpm 72 bpm    Oxygen Saturation (Admit) 85 % 92 % 95 % 98 %    Oxygen Saturation (Exercise) 91 % 90 % 92 % 92 %    Oxygen Saturation (Exit) 92 % 94 % 93 % 94 %    Rating of Perceived Exertion (Exercise) 14 14 15 15     Perceived Dyspnea (Exercise) 3 1 2 3     Symptoms none none none none    Duration Continue with 30 min of aerobic exercise without  signs/symptoms of  physical distress. Continue with 30 min of aerobic exercise without signs/symptoms of physical distress. Continue with 30 min of aerobic exercise without signs/symptoms of physical distress. Continue with 30 min of aerobic exercise without signs/symptoms of physical distress.    Intensity THRR unchanged THRR unchanged THRR unchanged THRR unchanged      Progression   Progression Continue to progress workloads to maintain intensity without signs/symptoms of physical distress. Continue to progress workloads to maintain intensity without signs/symptoms of physical distress. Continue to progress workloads to maintain intensity without signs/symptoms of physical distress. Continue to progress workloads to maintain intensity without signs/symptoms of physical distress.    Average METs 2.87 2.3 2.7 2.7      Resistance Training   Training Prescription Yes Yes Yes Yes    Weight 3 lb 3 lb 3 lb 3 lb    Reps 10-15 10-15 10-15 10-15      Interval Training   Interval Training No No No No      Treadmill   MPH -- 1 2.2 2.3    Grade -- 0 0 0.5    Minutes -- 15 15 15     METs -- 1.77 2.84 2.92      Recumbant Bike   Level 1 1 1  --    Watts 19 19 19  --    Minutes 15 15 15  --    METs 2.74 2.75 2.75 --      NuStep   Level 4 3 3 3     Minutes 15 15 15 15     METs 3.1 2.8 3.3 3.2      Biostep-RELP   Level -- -- -- 3    Minutes -- -- -- 15    METs -- -- -- 2      Track   Laps 30 16 -- --    Minutes 15 15 -- --    METs 2.63 1.87 -- --      Home Exercise Plan   Plans to continue exercise at Home (comment)  Recumbent at home, walking outside (weather permitting) Home (comment)  Recumbent at home, walking outside (weather permitting) Home (comment)  Recumbent at home, walking outside (weather permitting) Home (comment)  Recumbent at home, walking outside (weather permitting)    Frequency Add 2 additional days to program exercise sessions. Add 2 additional days to program exercise sessions. Add 2  additional days to program exercise sessions. Add 2 additional days to program exercise sessions.    Initial Home Exercises Provided 08/10/23 08/10/23 08/10/23 08/10/23      Oxygen   Maintain Oxygen Saturation 88% or higher 88% or higher 88% or higher 88% or higher             Exercise Comments:   Exercise Comments     Row Name 07/06/23 1610           Exercise Comments First full day of exercise!  Patient was oriented to gym and equipment including functions, settings, policies, and procedures.  Patient's individual exercise prescription and treatment plan were reviewed.  All starting workloads were established based on the results of the 6 minute walk test done at initial orientation visit.  The plan for exercise progression was also introduced and progression will be customized based on patient's performance and goals.                Exercise Goals and Review:   Exercise Goals     Row Name 06/28/23 1536  Exercise Goals   Increase Physical Activity Yes       Intervention Provide advice, education, support and counseling about physical activity/exercise needs.;Develop an individualized exercise prescription for aerobic and resistive training based on initial evaluation findings, risk stratification, comorbidities and participant's personal goals.       Expected Outcomes Short Term: Attend rehab on a regular basis to increase amount of physical activity.;Long Term: Add in home exercise to make exercise part of routine and to increase amount of physical activity.;Long Term: Exercising regularly at least 3-5 days a week.       Increase Strength and Stamina Yes       Intervention Provide advice, education, support and counseling about physical activity/exercise needs.;Develop an individualized exercise prescription for aerobic and resistive training based on initial evaluation findings, risk stratification, comorbidities and participant's personal goals.        Expected Outcomes Short Term: Increase workloads from initial exercise prescription for resistance, speed, and METs.;Short Term: Perform resistance training exercises routinely during rehab and add in resistance training at home;Long Term: Improve cardiorespiratory fitness, muscular endurance and strength as measured by increased METs and functional capacity ( )       Able to understand and use rate of perceived exertion (RPE) scale Yes       Intervention Provide education and explanation on how to use RPE scale       Expected Outcomes Short Term: Able to use RPE daily in rehab to express subjective intensity level;Long Term:  Able to use RPE to guide intensity level when exercising independently       Able to understand and use Dyspnea scale Yes       Intervention Provide education and explanation on how to use Dyspnea scale       Expected Outcomes Short Term: Able to use Dyspnea scale daily in rehab to express subjective sense of shortness of breath during exertion;Long Term: Able to use Dyspnea scale to guide intensity level when exercising independently       Knowledge and understanding of Target Heart Rate Range (THRR) Yes       Intervention Provide education and explanation of THRR including how the numbers were predicted and where they are located for reference       Expected Outcomes Short Term: Able to state/look up THRR;Long Term: Able to use THRR to govern intensity when exercising independently;Short Term: Able to use daily as guideline for intensity in rehab       Able to check pulse independently Yes       Intervention Provide education and demonstration on how to check pulse in carotid and radial arteries.;Review the importance of being able to check your own pulse for safety during independent exercise       Expected Outcomes Short Term: Able to explain why pulse checking is important during independent exercise;Long Term: Able to check pulse independently and accurately        Understanding of Exercise Prescription Yes       Intervention Provide education, explanation, and written materials on patient's individual exercise prescription       Expected Outcomes Short Term: Able to explain program exercise prescription;Long Term: Able to explain home exercise prescription to exercise independently                Exercise Goals Re-Evaluation :  Exercise Goals Re-Evaluation     Row Name 07/06/23 1914 07/20/23 1500 07/25/23 0919 08/01/23 1500 08/10/23 1010     Exercise Goal Re-Evaluation  Exercise Goals Review Able to understand and use rate of perceived exertion (RPE) scale;Able to understand and use Dyspnea scale;Knowledge and understanding of Target Heart Rate Range (THRR);Understanding of Exercise Prescription Increase Physical Activity;Increase Strength and Stamina;Understanding of Exercise Prescription Increase Physical Activity;Increase Strength and Stamina;Understanding of Exercise Prescription Increase Physical Activity;Increase Strength and Stamina;Understanding of Exercise Prescription Increase Physical Activity;Increase Strength and Stamina;Understanding of Exercise Prescription;Able to understand and use Dyspnea scale;Knowledge and understanding of Target Heart Rate Range (THRR);Able to understand and use rate of perceived exertion (RPE) scale;Able to check pulse independently   Comments Reviewed RPE and dyspnea scale, THR and program prescription with pt today.  Pt voiced understanding and was given a copy of goals to take home. Kenedie is off to a good start in the program. She was able to walk up to 25 laps on the track and work at level 1 on the recumbent bike. She also improved to level 2 on the T4 nustep and increased to 3 lb hand weights for resistance training. We will continue to monitor her progress in the program. She is doing well with exercise at rehab, currently on level 2 with the T4. was able to get 30laps walking the track. Is still doing well with  3lbs with hand weight. She is not exercising much at home, walking to the mailbox and back, ~1/4 mile daily. Encouraged her to walk and look for exercises she can do at home, to speak with exercise team here at rehab if she has questions. Dorrine continues to do well in rehab. She has increased her level to 3 on the T4 nustep and increased her laps to 35 on the track. She has also increased her level to 2 on the recumbent bike and biostep. We will continue to monitor her progress in the program. Reviewed home exercise with pt today from 09:20 to 09:34.  Pt plans to use recumbent bike at home and walk outside on days away from rehab for exercise.  Reviewed THR, pulse, RPE, sign and symptoms, pulse oximetery and when to call 911 or MD.  Also discussed weather considerations and indoor options.  Pt voiced understanding.   Expected Outcomes Short: Use RPE daily to regulate intensity. Long: Follow program prescription in THR. Short: Continue to follow current exercise prescription. Long: Continue exercise to improve strength and stamina. Short: Continue to follow current exercise at rehab and at home. Long: Continue exercise and improve strength and stamina Short: Continue to progressively increase workloads with the nustep, recumbent bike, biostep, and track. Long: Continue exercise to improve strength and stamina. Short: Begin using recumbent bike at home on days away from rehab. Long: Continue to exercise independently.    Row Name 08/17/23 1656 08/29/23 0810 09/05/23 0957 09/13/23 1342 09/27/23 0940     Exercise Goal Re-Evaluation   Exercise Goals Review Increase Physical Activity;Increase Strength and Stamina;Understanding of Exercise Prescription Increase Physical Activity;Increase Strength and Stamina;Understanding of Exercise Prescription Increase Physical Activity;Increase Strength and Stamina;Understanding of Exercise Prescription Increase Physical Activity;Increase Strength and Stamina;Understanding of  Exercise Prescription Increase Physical Activity;Increase Strength and Stamina;Understanding of Exercise Prescription   Comments Meloney is doing well in rehab. She was able to increase her level on the T4 nustep from level 3 to 4. She was also able to increase her level on the biostep from level 2 to 3. We will continue to monitor her progress in the program. Berlin continues to do well in the program. She continues to work at level 4 on the T4  nustep and level 1 on the recumbent bike. She also continues to walk 30 laps on the track and uses 3 lb hand weights for resistance training. We will continue to monitor her progress in the program. Brigit is doing well in the program. She states that she has not yet started exercising on her days away from rehab as she was busy around the holidays. She plans to start adding in home exercise by walking at home and using her recumbent bike. She also mentions that she and her husband are looking into possibly joining a gym. We will continue to monitor her progress int the program. Isadore is doing well in rehab. She recently used the treadmill for the first time at a speed of . She was also able to maintain her level on the recumbent bike, and decreased to level 3 on the T4 nustep. We will continue to monitor her progression in the program. Liberty continues to do well in rehab. She has gotten more comfortable with the treadmill and increased her workload with a speed of 2.2 mph and 0.5% incline. She has maintained level 3 on the T4 nustep. We will continue to monitor (his/her) progress in the program.   Expected Outcomes Short: Try level 3 on the recumbent bike. Long: Continue to exercise to improve strength and stamina. Short: Try improving to level 2 on the recumbent bike. Long: Continue to exercise to improve strength and stamina. Short: Begin walking and using recumbent bike on days away from rehab. Long: Continue exercise to improve strength and stamina. Short:  Continue to use and increase treadmill workloads. Long: Continue exercise to increase strength and stamina. Short: Continue to progressively increase treadmill workload and level on the T4 nustep. Long: Continue exercise to improve strength and stamina.    Row Name 10/10/23 1610 10/12/23 0949           Exercise Goal Re-Evaluation   Exercise Goals Review Increase Physical Activity;Increase Strength and Stamina;Understanding of Exercise Prescription Increase Physical Activity;Increase Strength and Stamina;Understanding of Exercise Prescription      Comments Avalene is doing well in the program. She recently completed her post-6MWT and was able to improve by 33.6%. She was also able to increase her workload on the treadmill to a speed of 2. and an incline of 0.5%. We will continue to monitor her pogress in the program. Lillith still plans on joining a gym after she completes the program. She states that there isnt many options in her area that take her insurance. She has begun to utilize her home exercise handouts to help her stay active at home. She still walks outside with her husband but allows herself to recover from days she attends the program.      Expected Outcomes Short: Continue to progressively increase treadmill workload. Long: Continue exercise to improve strength and stamina. Short: Continue to search for a program alternative for post-grad. Long: Continue exercise to improve strength and stamina.               Discharge Exercise Prescription (Final Exercise Prescription Changes):  Exercise Prescription Changes - 10/10/23 0700       Response to Exercise   Blood Pressure (Admit) 98/58    Blood Pressure (Exit) 110/64    Heart Rate (Admit) 68 bpm    Heart Rate (Exercise) 96 bpm    Heart Rate (Exit) 72 bpm    Oxygen Saturation (Admit) 98 %    Oxygen Saturation (Exercise) 92 %    Oxygen Saturation (  Exit) 94 %    Rating of Perceived Exertion (Exercise) 15    Perceived Dyspnea  (Exercise) 3    Symptoms none    Duration Continue with 30 min of aerobic exercise without signs/symptoms of physical distress.    Intensity THRR unchanged      Progression   Progression Continue to progress workloads to maintain intensity without signs/symptoms of physical distress.    Average METs 2.7      Resistance Training   Training Prescription Yes    Weight 3 lb    Reps 10-15      Interval Training   Interval Training No      Treadmill   MPH 2.3    Grade 0.5    Minutes 15    METs 2.92      NuStep   Level 3    Minutes 15    METs 3.2      Biostep-RELP   Level 3    Minutes 15    METs 2      Home Exercise Plan   Plans to continue exercise at Home (comment)   Recumbent at home, walking outside (weather permitting)   Frequency Add 2 additional days to program exercise sessions.    Initial Home Exercises Provided 08/10/23      Oxygen   Maintain Oxygen Saturation 88% or higher             Nutrition:  Target Goals: Understanding of nutrition guidelines, daily intake of sodium 1500mg , cholesterol 200mg , calories 30% from fat and 7% or less from saturated fats, daily to have 5 or more servings of fruits and vegetables.  Education: All About Nutrition: -Group instruction provided by verbal, written material, interactive activities, discussions, models, and posters to present general guidelines for heart healthy nutrition including fat, fiber, MyPlate, the role of sodium in heart healthy nutrition, utilization of the nutrition label, and utilization of this knowledge for meal planning. Follow up email sent as well. Written material given at graduation.   Biometrics:  Pre Biometrics - 06/28/23 1537       Pre Biometrics   Height 5' 2.1" (1.577 m)    Weight 182 lb 3.2 oz (82.6 kg)    Waist Circumference 43 inches    Hip Circumference 45.5 inches    Waist to Hip Ratio 0.95 %    BMI (Calculated) 33.23    Single Leg Stand 12 seconds             Post  Biometrics - 10/05/23 0944        Post  Biometrics   Height 5' 2.09" (1.577 m)    Weight 175 lb 1.6 oz (79.4 kg)    Waist Circumference 40.5 inches    Hip Circumference 39.5 inches    Waist to Hip Ratio 1.03 %    BMI (Calculated) 31.94    Single Leg Stand 13.35 seconds             Nutrition Therapy Plan and Nutrition Goals:  Nutrition Therapy & Goals - 06/28/23 1602       Nutrition Therapy   Diet Cardiac, Low Na    Protein (specify units) 75    Fiber 25 grams    Whole Grain Foods 3 servings    Saturated Fats 15 max. grams    Fruits and Vegetables 5 servings/day    Sodium 2 grams      Personal Nutrition Goals   Nutrition Goal Drink ~3 bottles of water per day  Personal Goal #2 Eat a protein and carb at each meal    Personal Goal #3 Use small nutrient dense snacks when not very hungry    Comments Patient drinking 32oz of water daily, spoke about increasing to 48oz. She says sweets are her biggest struggle. Recommended some healthier sweet treat ideas to try. Encouraged her to drink some water before snacking. She reports she is trying to lose weight, doesn't eat a lot but still struggling to lose the weight. Spoke to her about nutrient and calorie density, encouraged her to eat smaller but more nutrient dense foods to help her meet her nutrient goals like protein, fiber, and calories. Reviewed mediterranean diet handout, education on types of fats, sources, and how to read label. Built out several meals and snacks with foods she likes and will eat.      Intervention Plan   Intervention Prescribe, educate and counsel regarding individualized specific dietary modifications aiming towards targeted core components such as weight, hypertension, lipid management, diabetes, heart failure and other comorbidities.;Nutrition handout(s) given to patient.    Expected Outcomes Short Term Goal: Understand basic principles of dietary content, such as calories, fat, sodium, cholesterol and  nutrients.;Short Term Goal: A plan has been developed with personal nutrition goals set during dietitian appointment.;Long Term Goal: Adherence to prescribed nutrition plan.             Nutrition Assessments:  MEDIFICTS Score Key: >=70 Need to make dietary changes  40-70 Heart Healthy Diet <= 40 Therapeutic Level Cholesterol Diet  Flowsheet Row Pulmonary Rehab from 10/10/2023 in [facility]  Picture Your Plate Total Score on Discharge 68      Picture Your Plate Scores: <16 Unhealthy dietary pattern with much room for improvement. 41-50 Dietary pattern unlikely to meet recommendations for good health and room for improvement. 51-60 More healthful dietary pattern, with some room for improvement.  >60 Healthy dietary pattern, although there may be some specific behaviors that could be improved.   Nutrition Goals Re-Evaluation:  Nutrition Goals Re-Evaluation     Row Name 07/25/23 0933 08/17/23 1046 09/05/23 1306 10/12/23 0934       Goals   Nutrition Goal Drink 3 bottles of water per day -- -- Drink 3 bottles of water per day    Comment She isnt drinking as much water as she should be. Spoke with her baout the barriers to this goals, concluded together that she is likely forgetting. Suggested she set up a few alarms on her phone to remind her. She agrees that would be a good idea as she uses alarms for other things in her daily routine. Also spoke with her about eating adequate protein and carbs to support energy and increased needs. She is doing that most meals but sometimes doesnt feel hungry and will miss dinner or use a snack instead. Jalani is still trying to increase her water intake. She is carrying her water around more which is an improvement. She has been using alarms to help remind herself. Her appetite is about the same and she notes she has been extra busy during this holiday season. Nikitta is still trying to increase her water intake. She states that she  is doing better, but could still improve. She is still dealing with her poor appetite, but is trying to include more protein in meals, especially with her breakfast. Fredia is still managing her water intake. She talked to her doctor about her weight loss goals and that she had been  unable to lose. She was recently diagnosed with insulin resistance and was prescribed metformin. She is still trying to keep her protein intake constant, and is trying to eat at least 2 meals a day.    Expected Outcome Short: Drink 3 bottles of water and dont miss meals. Long: Maintain healthy hydration and eating habits Short: set reminders for water and healthy eating during this busy holiday season. Long:  independently manage healthy eating habits Short: Continue to drink more water throughout the day. Long: Continue to practice dietary patterns discussed with RD. Short: Continue to drink more water, and try to increase meals to 3. Long: Continue to practice dietary patterns discussed with RD.             Nutrition Goals Discharge (Final Nutrition Goals Re-Evaluation):  Nutrition Goals Re-Evaluation - 10/12/23 0934       Goals   Nutrition Goal Drink 3 bottles of water per day    Comment Mehak is still managing her water intake. She talked to her doctor about her weight loss goals and that she had been unable to lose. She was recently diagnosed with insulin resistance and was prescribed metformin. She is still trying to keep her protein intake constant, and is trying to eat at least 2 meals a day.    Expected Outcome Short: Continue to drink more water, and try to increase meals to 3. Long: Continue to practice dietary patterns discussed with RD.             Psychosocial: Target Goals: Acknowledge presence or absence of significant depression and/or stress, maximize coping skills, provide positive support system. Participant is able to verbalize types and ability to use techniques and skills needed for  reducing stress and depression.   Education: Stress, Anxiety, and Depression - Group verbal and visual presentation to define topics covered.  Reviews how body is impacted by stress, anxiety, and depression.  Also discusses healthy ways to reduce stress and to treat/manage anxiety and depression.  Written material given at graduation.   Education: Sleep Hygiene -Provides group verbal and written instruction about how sleep can affect your health.  Define sleep hygiene, discuss sleep cycles and impact of sleep habits. Review good sleep hygiene tips.    Initial Review & Psychosocial Screening:  Initial Psych Review & Screening - 06/21/23 1339       Initial Review   Current issues with Current Anxiety/Panic      Family Dynamics   Good Support System? Yes    Comments Her husband has some dementia and other health issues. She can call her daughters if she gets anxiety she has some medication for it.      Barriers   Psychosocial barriers to participate in program The patient should benefit from training in stress management and relaxation.      Screening Interventions   Interventions Encouraged to exercise;To provide support and resources with identified psychosocial needs;Provide feedback about the scores to participant    Expected Outcomes Short Term goal: Utilizing psychosocial counselor, staff and physician to assist with identification of specific Stressors or current issues interfering with healing process. Setting desired goal for each stressor or current issue identified.;Long Term Goal: Stressors or current issues are controlled or eliminated.;Short Term goal: Identification and review with participant of any Quality of Life or Depression concerns found by scoring the questionnaire.;Long Term goal: The participant improves quality of Life and PHQ9 Scores as seen by post scores and/or verbalization of changes  Quality of Life Scores:  Scores of 19 and below usually  indicate a poorer quality of life in these areas.  A difference of  2-3 points is a clinically meaningful difference.  A difference of 2-3 points in the total score of the Quality of Life Index has been associated with significant improvement in overall quality of life, self-image, physical symptoms, and general health in studies assessing change in quality of life.  PHQ-9: Review Flowsheet       10/10/2023 06/28/2023  Depression screen PHQ 2/9  Decreased Interest 0 0  Down, Depressed, Hopeless 0 0  PHQ - 2 Score 0 0  Altered sleeping 0 0  Tired, decreased energy 1 1  Change in appetite 2 1  Feeling bad or failure about yourself  0 1  Trouble concentrating 0 0  Moving slowly or fidgety/restless 0 0  Suicidal thoughts 0 0  PHQ-9 Score 3 3  Difficult doing work/chores Not difficult at all -   Interpretation of Total Score  Total Score Depression Severity:  1-4 = Minimal depression, 5-9 = Mild depression, 10-14 = Moderate depression, 15-19 = Moderately severe depression, 20-27 = Severe depression   Psychosocial Evaluation and Intervention:  Psychosocial Evaluation - 06/21/23 1342       Psychosocial Evaluation & Interventions   Interventions Encouraged to exercise with the program and follow exercise prescription;Relaxation education;Stress management education    Comments Her husband has some dementia and other health issues. She can call her daughters if she gets anxiety she has some medication for it.    Expected Outcomes Short: Start [facility] to help with mood. Long: Maintain a healthy mental state.    Continue Psychosocial Services  Follow up required by staff             Psychosocial Re-Evaluation:  Psychosocial Re-Evaluation     Row Name 07/25/23 0930 08/17/23 1035 09/05/23 1302 10/12/23 1610       Psychosocial Re-Evaluation   Current issues with None Identified Current Stress Concerns Current Stress Concerns Current Stress Concerns    Comments She is not dealing  with any drepssion or anxiety stressors. She reports she has good social support and relies in prayer and friends. holidays are busy and she needs to get the house ready. Elize is happy that she can notice some results in the 16 sessions she has been here. She notes her shortness of breath is not as severe doing certain activities. She mentions a some of her family relies on her for transportation and to keep things organized during the holidays. She does have a great support system, but sometimes it weight on her. Her husband's dementia is stable for now and they are looking forward to holiday events because he enjoys eating with friends and makes him happy. 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 She finds comfort in her church and family Zonia reports no major stress concerns at this time. She states that she is still very aware of the reality that her husband is dealing with dementia and parkinsons, however she beleives it is just part of life. She also has a lot of appointments for both her and her husband and she is the one who drives them tot these appointments. She does enjoy reading and doing crafts for stress relief. She also gets together with a ladies group and plays cards which she finds very enjoyable. Lauran is having some stress as of right now. Her husband  has parkinsons and dementia that continues to give her stress. He recently had a fall, which then turned into an infection, which caused her to have a lot of stress last week. She says that the program, and her womens group has helped her relieve a lot of her current stress.    Expected Outcomes short: stay focus and postive during holidays.Long: stay positive and use social support Short: attend pulmonary rehab for education on stress reduction. Long: maintain positive self care habits Short: Continue to manage stress relief through healthy avenues. Long: Maintain positive outlook. Short: Continue to exercise and  look to her support groups for healthy stress relief. Long: Continue to maintain a positive outlook.    Interventions Encouraged to attend Pulmonary Rehabilitation for the exercise -- Encouraged to attend Pulmonary Rehabilitation for the exercise Encouraged to attend Pulmonary Rehabilitation for the exercise    Continue Psychosocial Services  Follow up required by staff Follow up required by staff Follow up required by staff Follow up required by staff             Psychosocial Discharge (Final Psychosocial Re-Evaluation):  Psychosocial Re-Evaluation - 10/12/23 0927       Psychosocial Re-Evaluation   Current issues with Current Stress Concerns    Comments Iliani is having some stress as of right now. Her husband has parkinsons and dementia that continues to give her stress. He recently had a fall, which then turned into an infection, which caused her to have a lot of stress last week. She says that the program, and her womens group has helped her relieve a lot of her current stress.    Expected Outcomes Short: Continue to exercise and look to her support groups for healthy stress relief. Long: Continue to maintain a positive outlook.    Interventions Encouraged to attend Pulmonary Rehabilitation for the exercise    Continue Psychosocial Services  Follow up required by staff             Education: Education Goals: Education classes will be provided on a weekly basis, covering required topics. Participant will state understanding/return demonstration of topics presented.  Learning Barriers/Preferences:  Learning Barriers/Preferences - 06/21/23 1338       Learning Barriers/Preferences   Learning Barriers Hearing    Learning Preferences None             General Pulmonary Education Topics:  Infection Prevention: - Provides verbal and written material to individual with discussion of infection control including proper hand washing and proper equipment cleaning during exercise  session. Flowsheet Row Pulmonary Rehab from 08/17/2023 in [facility] Cardiac and Pulmonary Rehab  Date 06/28/23  Educator MB  Instruction Review Code 1- Verbalizes Understanding       Falls Prevention: - Provides verbal and written material to individual with discussion of falls prevention and safety. Flowsheet Row Pulmonary Rehab from 08/17/2023 in [facility] Cardiac and Pulmonary Rehab  Date 06/28/23  Educator MB  Instruction Review Code 1- Verbalizes Understanding       Chronic Lung Disease Review: - Group verbal instruction with posters, models, PowerPoint presentations and videos,  to review new updates, new respiratory medications, new advancements in procedures and treatments. Providing information on websites and "800" numbers for continued self-education. Includes information about supplement oxygen, available portable oxygen systems, continuous and intermittent flow rates, oxygen safety, concentrators, and Medicare reimbursement for oxygen. Explanation of Pulmonary Drugs, including class, frequency, complications, importance of spacers, rinsing mouth after steroid MDI's, and proper cleaning methods for nebulizers. Review  of basic lung anatomy and physiology related to function, structure, and complications of lung disease. Review of risk factors. Discussion about methods for diagnosing sleep apnea and types of masks and machines for OSA. Includes a review of the use of types of environmental controls: home humidity, furnaces, filters, dust mite/pet prevention, HEPA vacuums. Discussion about weather changes, air quality and the benefits of nasal washing. Instruction on Warning signs, infection symptoms, calling MD promptly, preventive modes, and value of vaccinations. Review of effective airway clearance, coughing and/or vibration techniques. Emphasizing that all should Create an Action Plan. Written material given at graduation. Flowsheet Row Pulmonary Rehab from 08/17/2023 in [facility] Cardiac and  Pulmonary Rehab  Education need identified 06/28/23  Date 08/17/23  Educator [facility]  Instruction Review Code 1- Verbalizes Understanding       AED/CPR: - Group verbal and written instruction with the use of models to demonstrate the basic use of the AED with the basic ABC's of resuscitation.    Anatomy and Cardiac Procedures: - Group verbal and visual presentation and models provide information about basic cardiac anatomy and function. Reviews the testing methods done to diagnose heart disease and the outcomes of the test results. Describes the treatment choices: Medical Management, Angioplasty, or Coronary Bypass Surgery for treating various heart conditions including Myocardial Infarction, Angina, Valve Disease, and Cardiac Arrhythmias.  Written material given at graduation.   Medication Safety: - Group verbal and visual instruction to review commonly prescribed medications for heart and lung disease. Reviews the medication, class of the drug, and side effects. Includes the steps to properly store meds and maintain the prescription regimen.  Written material given at graduation.   Other: -Provides group and verbal instruction on various topics (see comments)   Knowledge Questionnaire Score:  Knowledge Questionnaire Score - 10/10/23 1006       Knowledge Questionnaire Score   Post Score 17/18              Core Components/Risk Factors/Patient Goals at Admission:  Personal Goals and Risk Factors at Admission - 06/28/23 1539       Core Components/Risk Factors/Patient Goals on Admission    Weight Management Weight Loss;Yes    Intervention Weight Management: Develop a combined nutrition and exercise program designed to reach desired caloric intake, while maintaining appropriate intake of nutrient and fiber, sodium and fats, and appropriate energy expenditure required for the weight goal.;Weight Management: Provide education and appropriate resources to help participant work on and  attain dietary goals.;Weight Management/Obesity: Establish reasonable short term and long term weight goals.    Admit Weight 182 lb 3.2 oz (82.6 kg)    Goal Weight: Short Term 172 lb 3.2 oz (78.1 kg)    Goal Weight: Long Term 162 lb 3.2 oz (73.6 kg)    Expected Outcomes Short Term: Continue to assess and modify interventions until short term weight is achieved;Long Term: Adherence to nutrition and physical activity/exercise program aimed toward attainment of established weight goal;Weight Loss: Understanding of general recommendations for a balanced deficit meal plan, which promotes 1-2 lb weight loss per week and includes a negative energy balance of [phone] kcal/d;Understanding recommendations for meals to include 15-35% energy as protein, 25-35% energy from fat, 35-60% energy from carbohydrates, less than 200mg  of dietary cholesterol, 20-35 gm of total fiber daily;Understanding of distribution of calorie intake throughout the day with the consumption of 4-5 meals/snacks    Improve shortness of breath with ADL's Yes    Intervention Provide education, individualized exercise plan and daily  activity instruction to help decrease symptoms of SOB with activities of daily living.    Expected Outcomes Long Term: Be able to perform more ADLs without symptoms or delay the onset of symptoms;Short Term: Improve cardiorespiratory fitness to achieve a reduction of symptoms when performing ADLs    Hypertension Yes    Intervention Provide education on lifestyle modifcations including regular physical activity/exercise, weight management, moderate sodium restriction and increased consumption of fresh fruit, vegetables, and low fat dairy, alcohol moderation, and smoking cessation.;Monitor prescription use compliance.    Expected Outcomes Short Term: Continued assessment and intervention until BP is < 140/42mm HG in hypertensive participants. < 130/18mm HG in hypertensive participants with diabetes, heart failure or  chronic kidney disease.;Long Term: Maintenance of blood pressure at goal levels.    Lipids Yes    Intervention Provide education and support for participant on nutrition & aerobic/resistive exercise along with prescribed medications to achieve LDL 70mg , HDL >40mg .    Expected Outcomes Short Term: Participant states understanding of desired cholesterol values and is compliant with medications prescribed. Participant is following exercise prescription and nutrition guidelines.;Long Term: Cholesterol controlled with medications as prescribed, with individualized exercise RX and with personalized nutrition plan. Value goals: LDL < 70mg , HDL > 40 mg.             Education:Diabetes - Individual verbal and written instruction to review signs/symptoms of diabetes, desired ranges of glucose level fasting, after meals and with exercise. Acknowledge that pre and post exercise glucose checks will be done for 3 sessions at entry of program.   Know Your Numbers and Heart Failure: - Group verbal and visual instruction to discuss disease risk factors for cardiac and pulmonary disease and treatment options.  Reviews associated critical values for Overweight/Obesity, Hypertension, Cholesterol, and Diabetes.  Discusses basics of heart failure: signs/symptoms and treatments.  Introduces Heart Failure Zone chart for action plan for heart failure.  Written material given at graduation.   Core Components/Risk Factors/Patient Goals Review:   Goals and Risk Factor Review     Row Name 07/25/23 0935 08/17/23 1031 09/05/23 1318 10/12/23 0939       Core Components/Risk Factors/Patient Goals Review   Personal Goals Review Weight Management/Obesity Improve shortness of breath with ADL's;Hypertension Hypertension;Improve shortness of breath with ADL's Hypertension;Improve shortness of breath with ADL's    Review Patient is eating better snacks, working on drinking more water, choosing healthy foods but is frustrated at  not losing weight. Spoke with her about the importance of not missing meals as she sometimes does, how low caloire foods are often healthy but can make it hard to meet caloire goals when not eaten in larger quantities. Educated her on the use of healthy fats for calorie dense options. Likisha has noticed an improvement in her breathing since starting the program. She notes that it is easier to go up and down the bleachers at her grandson's basketball game and to keep the same pace as her husband. Her blood pressure has been stable and she saw her primary care doctor this week and was given the all good to keep taking her current medication. He did add metformin because she is becoming insulin resistant so they are trying to get ahead of it by medication and lifestyle management. Chasitie states that her SOB has been well controlled while in the program, however, when she is rushing or going up an incline she tends to get more SOB. We discussed beginning to add incline on the treadmill to condition  her, so that she will be able to walk up inclines without being as SOB. She also states that she checks her BP routinely throughout the week and has noticed that it has been lower. She also states that her HR has been lower, sometimes in the 50's which she believes has made her feel more fatigued. I encouraged her to talk to her doctor about her medications, as she feels this is the cause of her lower BP and heart rate. Kiffany has stated that her SOB has still been most noticeable while walking at an incline. She has begun utilizing the incline on the treadmill in order to condition herself to a grade. She has noted some improvement with hills since introducing incline on her treadmill workloads. Estera has not been taking her BP at home as often as she was. She understands that if she feels off that she should check it.    Expected Outcomes Short: do not miss meals, use healthy fats to meet caloire needs for weight loss.  Long: Maintain healthy eating habits to support weight loss Short: attend pulmonary rehab for supervised exercise education and take her metformin  Long: independently manage risk factors Short: Talk to doctor about medications and low heart rate. Long: Continue to manage lifestyle risk factors. Short: start taking BP at home. Long: Continue to manage risk factors.             Core Components/Risk Factors/Patient Goals at Discharge (Final Review):   Goals and Risk Factor Review - 10/12/23 0939       Core Components/Risk Factors/Patient Goals Review   Personal Goals Review Hypertension;Improve shortness of breath with ADL's    Review Mike has stated that her SOB has still been most noticeable while walking at an incline. She has begun utilizing the incline on the treadmill in order to condition herself to a grade. She has noted some improvement with hills since introducing incline on her treadmill workloads. Aashritha has not been taking her BP at home as often as she was. She understands that if she feels off that she should check it.    Expected Outcomes Short: start taking BP at home. Long: Continue to manage risk factors.             ITP Comments:  ITP Comments     Row Name 06/21/23 1336 06/28/23 1556 07/05/23 0834 07/06/23 0937 07/26/23 1123   ITP Comments Virtual Visit completed. Patient informed on EP and RD appointment and 6 Minute walk test. Patient also informed of patient health questionnaires on My Chart. Patient Verbalizes understanding. Visit diagnosis can be found in [facility] 06/13/2023. Completed and gym orientation. Initial ITP created and sent for review to Dr. Jinny Sanders, Medical Director. 30 Day review completed. Medical Director ITP review done, changes made as directed, and signed approval by Medical Director.    new to program First full day of exercise!  Patient was oriented to gym and equipment including functions, settings, policies, and procedures.  Patient's  individual exercise prescription and treatment plan were reviewed.  All starting workloads were established based on the results of the 6 minute walk test done at initial orientation visit.  The plan for exercise progression was also introduced and progression will be customized based on patient's performance and goals. 30 Day review completed. Medical Director ITP review done, changes made as directed, and signed approval by Medical Director.    new to program    Row Name 08/23/23 [phone] 09/20/23 1244 10/18/23  3244       ITP Comments 30 Day review completed. Medical Director ITP review done, changes made as directed, and signed approval by Medical Director. 30 Day review completed. Medical Director ITP review done, changes made as directed, and signed approval by Medical Director. 30 Day review completed. Medical Director ITP review done, changes made as directed, and signed approval by Medical Director.              Comments:

## 2023-10-24 ENCOUNTER — Encounter: Payer: Medicare Other | Admitting: *Deleted

## 2023-10-24 DIAGNOSIS — D860 Sarcoidosis of lung: Secondary | ICD-10-CM | POA: Diagnosis not present

## 2023-10-24 DIAGNOSIS — D869 Sarcoidosis, unspecified: Secondary | ICD-10-CM

## 2023-10-24 NOTE — Progress Notes (Signed)
Daily Session Note  Patient Details  Name: Megan Daniels MRN: [id number] Date of Birth: [birth year] Referring Provider:   Flowsheet Row Pulmonary Rehab from 06/28/2023 in [facility] Cardiac and Pulmonary Rehab  Referring Provider Vida Rigger, MD       Encounter Date: 10/24/2023  Check In:  Session Check In - 10/24/23 0926       Check-In   Supervising physician immediately available to respond to emergencies See telemetry face sheet for immediately available ER MD    Location [facility]-Cardiac & Pulmonary Rehab    Staff Present Cora Collum, RN, BSN, CCRP;Noah Tickle, BS, Exercise Physiologist;Maxon Conetta BS, Exercise Physiologist;Margaret Best, MS, Exercise Physiologist    Virtual Visit No    Medication changes reported     No    Fall or balance concerns reported    No    Warm-up and Cool-down Performed on first and last piece of equipment    Resistance Training Performed Yes    VAD Patient? No    PAD/SET Patient? No      Pain Assessment   Currently in Pain? No/denies                Social History   Tobacco Use  Smoking Status Never  Smokeless Tobacco Never    Goals Met:  Proper associated with RPD/PD & O2 Sat Independence with exercise equipment Exercise tolerated well No report of concerns or symptoms today  Goals Unmet:  Not Applicable  Comments:  [name] graduated today from  rehab with 35 sessions completed.  Details of the patient's exercise prescription and what She needs to do in order to continue the prescription and progress were discussed with patient.  Patient was given a copy of prescription and goals.  Patient verbalized understanding. Megan Daniels plans to continue to exercise by walking at home.   Dr. Bethann Punches is Medical Director for [facility] Cardiac Rehabilitation.  Dr. Vida Rigger is Medical Director for [facility] Pulmonary Rehabilitation.

## 2023-10-24 NOTE — Progress Notes (Signed)
Discharge Note for  Megan Daniels     11-29-1950        Kerry Fort graduated today from  rehab with 35 sessions completed.  Details of the patient's exercise prescription and what She needs to do in order to continue the prescription and progress were discussed with patient.  Patient was given a copy of prescription and goals.  Patient verbalized understanding. Gladys plans to continue to exercise by walking at home.    6 Minute Walk     Row Name 06/28/23 1525 10/05/23 0938       6 Minute Walk   Phase Initial Discharge    Distance 1040 feet 1390 feet    Distance % Change -- 33.6 %    Distance Feet Change -- 350 ft    Walk Time 6 minutes 6 minutes    # of Rest Breaks 0 0    MPH 1.97 2.63    METS 1.77 2.68    RPE 13 15    Perceived Dyspnea  2 2    VO2 Peak 6.19 --    Symptoms Yes (comment) Yes (comment)    Comments Bilateral hip pain Chest pain 4/10 resolved after the test concluded    Resting HR 73 bpm 72 bpm    Resting BP 112/66 --    Resting Oxygen Saturation  94 % 95 %    Exercise Oxygen Saturation  during 6 min walk 91 % 93 %    Max Ex. HR 90 bpm 106 bpm    Max Ex. BP 126/66 138/64    2 Minute Post BP 110/66 120/64      Interval HR   1 Minute HR 67 78    2 Minute HR 89 98    3 Minute HR 90 99    4 Minute HR 90 97    5 Minute HR 90 100    6 Minute HR 89 97    2 Minute Post HR 66 67    Interval Heart Rate? Yes Yes      Interval Oxygen   Interval Oxygen? Yes Yes    Baseline Oxygen Saturation % 94 % 95 %    1 Minute Oxygen Saturation % 93 % 94 %    1 Minute Liters of Oxygen 0 L 0 L    2 Minute Oxygen Saturation % 91 % 93 %    2 Minute Liters of Oxygen 0 L 0 L    3 Minute Oxygen Saturation % 92 % 93 %    3 Minute Liters of Oxygen 0 L 0 L    4 Minute Oxygen Saturation % 92 % 94 %    4 Minute Liters of Oxygen 0 L 0 L    5 Minute Oxygen Saturation % 93 % 93 %    5 Minute Liters of Oxygen 0 L 0 L    6 Minute Oxygen Saturation % 95 % 94 %    6 Minute Liters of Oxygen 0  L 0 L    2 Minute Post Oxygen Saturation % 98 % 95 %    2 Minute Post Liters of Oxygen 0 L 0 L

## 2023-10-24 NOTE — Progress Notes (Signed)
Pulmonary Individual Treatment Plan  Patient Details  Name: Megan Daniels MRN: [id number] Date of Birth: 13-Oct-1950 Referring Provider:   Flowsheet Row Pulmonary Rehab from 06/28/2023 in [facility] Cardiac and Pulmonary Rehab  Referring Provider Vida Rigger, MD       Initial Encounter Date:  Flowsheet Row Pulmonary Rehab from 06/28/2023 in [facility] Cardiac and Pulmonary Rehab  Date 06/28/23       Visit Diagnosis: Sarcoidosis  Patient's Home Medications on Admission:  Current Outpatient Medications:    acetaminophen (TYLENOL) 500 MG tablet, Take 1,000 mg by mouth every 6 (six) hours as needed for moderate pain or headache., Disp: , Rfl:    albuterol (VENTOLIN HFA) 108 (90 Base) MCG/ACT inhaler, Inhale 2 puffs into the lungs every 4 (four) hours as needed for wheezing or shortness of breath.  (Patient not taking: Reported on 11/25/2022), Disp: , Rfl:    albuterol (VENTOLIN HFA) 108 (90 Base) MCG/ACT inhaler, Inhale into the lungs., Disp: , Rfl:    amLODipine (NORVASC) 5 MG tablet, Take 5 mg by mouth daily., Disp: , Rfl:    aspirin EC 81 MG tablet, Take 1 tablet (81 mg total) by mouth daily. (Patient taking differently: Take 81 mg by mouth every evening.), Disp: , Rfl:    Black Elderberry (SAMBUCUS ELDERBERRY) 50 MG/5ML SYRP, Take by mouth., Disp: , Rfl:    busPIRone (BUSPAR) 10 MG tablet, Take 10 mg by mouth 2 (two) times daily. (Patient not taking: Reported on 06/21/2023), Disp: , Rfl:    carvedilol (COREG) 12.5 MG tablet, Take 12.5 mg by mouth., Disp: , Rfl:    carvedilol (COREG) 12.5 MG tablet, Take by mouth., Disp: , Rfl:    cephALEXin (KEFLEX) 500 MG capsule, Take 500 mg by mouth 3 (three) times daily. (Patient not taking: Reported on 06/21/2023), Disp: , Rfl:    cetirizine (ZYRTEC) 10 MG chewable tablet, Chew by mouth., Disp: , Rfl:    cetirizine (ZYRTEC) 10 MG tablet, Take by mouth. (Patient not taking: Reported on 06/21/2023), Disp: , Rfl:    Cholecalciferol (VITAMIN D) 2000  units CAPS, Take 2,000 Units by mouth daily. , Disp: , Rfl:    Cholecalciferol (VITAMIN D3) 50 MCG (2000 UT) capsule, Take by mouth., Disp: , Rfl:    Collagen Matrix, Porcine, 2 X 2 X 2 CM MISC, Use 1,000 mg once daily, Disp: , Rfl:    cyanocobalamin (VITAMIN B12) 500 MCG tablet, Take by mouth., Disp: , Rfl:    docusate sodium (COLACE) 100 MG capsule, Take 100 mg by mouth 2 (two) times daily. , Disp: , Rfl:    DULoxetine (CYMBALTA) 30 MG capsule, Take by mouth., Disp: , Rfl:    famotidine (PEPCID) 40 MG tablet, Take by mouth., Disp: , Rfl:    fluticasone (FLONASE) 50 MCG/ACT nasal spray, Place into the nose., Disp: , Rfl:    folic acid (FOLVITE) 1 MG tablet, Take by mouth., Disp: , Rfl:    gabapentin (NEURONTIN) 300 MG capsule, Take by mouth. (Patient not taking: Reported on 06/21/2023), Disp: , Rfl:    gabapentin (NEURONTIN) 300 MG capsule, Take by mouth., Disp: , Rfl:    irbesartan (AVAPRO) 300 MG tablet, TAKE 1 TABLET BY MOUTH DAILY, Disp: , Rfl:    irbesartan (AVAPRO) 300 MG tablet, Take by mouth. (Patient not taking: Reported on 06/21/2023), Disp: , Rfl:    losartan (COZAAR) 100 MG tablet, Take 100 mg by mouth at bedtime.  (Patient not taking: Reported on 11/25/2022), Disp: , Rfl:  magnesium oxide (MAG-OX) 400 MG tablet, Take by mouth., Disp: , Rfl:    metaxalone (SKELAXIN) 800 MG tablet, Take by mouth., Disp: , Rfl:    metoprolol succinate (TOPROL-XL) 25 MG 24 hr tablet, Take 25 mg by mouth 2 (two) times daily.  (Patient not taking: Reported on 11/25/2022), Disp: , Rfl:    Multiple Vitamin (MULTIVITAMIN WITH MINERALS) TABS tablet, Take 1 tablet by mouth daily., Disp: , Rfl:    omeprazole (PRILOSEC) 40 MG capsule, TAKE 1 CAPSULE DAILY BEFORE BREAKFAST, Disp: 90 capsule, Rfl: 3   omeprazole (PRILOSEC) 40 MG capsule, Take by mouth. (Patient not taking: Reported on 06/21/2023), Disp: , Rfl:    predniSONE (DELTASONE) 2.5 MG tablet, Take by mouth. (Patient not taking: Reported on 06/21/2023),  Disp: , Rfl:    sulfamethoxazole-trimethoprim (BACTRIM) 400-80 MG tablet, Take 1 tablet by mouth 3 (three) times a week. (Patient not taking: Reported on 06/21/2023), Disp: , Rfl:    topiramate (TOPAMAX) 50 MG tablet, Take 50 mg by mouth 2 (two) times daily., Disp: , Rfl:    vitamin B-12 (CYANOCOBALAMIN) 500 MCG tablet, Take 500 mcg by mouth daily. , Disp: , Rfl:    vitamin C (ASCORBIC ACID) 500 MG tablet, Take 500 mg by mouth daily., Disp: , Rfl:    Vitamins/Minerals TABS, Take by mouth., Disp: , Rfl:   Past Medical History: Past Medical History:  Diagnosis Date   Abnormal Pap smear of cervix    pos hpv-    Acid reflux    Anxiety    Arthritis    Asthma    Symbicort as needed   Cancer (HCC)    Basal Cell Skin Cancer   Chalazion of left eye    Chronic back pain    spondylolisthesis/stenosis   Chronic UTI    was treated for 3 months with Macrobid and in Dec everything was fine   Constipation    COPD (chronic obstructive pulmonary disease) (HCC)    Diverticulosis    Elevated serum creatinine    with nsaids   Foot drop    right   Genital warts    h/o   GERD (gastroesophageal reflux disease)    takes Prevacid daily   Headache    History of bronchitis    History of colon polyps    History of hiatal hernia    Hx of vertigo    Hypertension    takes Losartan daily   IBS (irritable bowel syndrome)    Joint pain    Palpitations    takes Metoprolol daily   Pneumonia    hx of-early 2000's   PONV (postoperative nausea and vomiting)    extreme nausea and vomiting -none last surgery 10/29/14   Sarcoidosis    Seasonal allergies    takes Claritin daily   Shortness of breath dyspnea    Sleep apnea    cpap 10 yrs   Stress incontinence    Thyroid nodule    URI (upper respiratory infection)    treated self with Mucinex and no fever   Urinary urgency    Vitamin D deficiency    Weakness    numbness and tingling in both legs    Tobacco Use: Social History   Tobacco Use   Smoking Status Never  Smokeless Tobacco Never    Labs: Review Flowsheet        No data to display           Pulmonary Assessment Scores:  Pulmonary Assessment Scores  Row Name 06/28/23 1537 10/10/23 1001       ADL UCSD   SOB Score total 30 34    Rest 0 0    Walk 0 0    Stairs 1 3    Bath 1 0    Dress 1 0    Shop 1 1      CAT Score   CAT Score 16 13      mMRC Score   mMRC Score 1 1             UCSD: Self-administered rating of dyspnea associated with activities of daily living (ADLs) 6-point scale (0 = "not at all" to 5 = "maximal or unable to do because of breathlessness")  Scoring Scores range from 0 to 120.  Minimally important difference is 5 units  CAT: CAT can identify the health impairment of COPD patients and is better correlated with disease progression.  CAT has a scoring range of zero to 40. The CAT score is classified into four groups of low (less than 10), medium (10 - 20), high (21-30) and very high (31-40) based on the impact level of disease on health status. A CAT score over 10 suggests significant symptoms.  A worsening CAT score could be explained by an exacerbation, poor medication adherence, poor inhaler technique, or progression of COPD or comorbid conditions.  CAT MCID is 2 points  mMRC: mMRC (Modified Medical Research Council) Dyspnea Scale is used to assess the degree of baseline functional disability in patients of respiratory disease due to dyspnea. No minimal important difference is established. A decrease in score of 1 point or greater is considered a positive change.   Pulmonary Function Assessment:  Pulmonary Function Assessment - 06/21/23 1338       Breath   Shortness of Breath Yes;Limiting activity             Exercise Target Goals: Exercise Program Goal: Individual exercise prescription set using results from initial 6 min walk test and THRR while considering  patient's activity barriers and safety.    Exercise Prescription Goal: Initial exercise prescription builds to 30-45 minutes a day of aerobic activity, 2-3 days per week.  Home exercise guidelines will be given to patient during program as part of exercise prescription that the participant will acknowledge.  Education: Aerobic Exercise: - Group verbal and visual presentation on the components of exercise prescription. Introduces F.I.T.T principle from ACSM for exercise prescriptions.  Reviews F.I.T.T. principles of aerobic exercise including progression. Written material given at graduation.   Education: Resistance Exercise: - Group verbal and visual presentation on the components of exercise prescription. Introduces F.I.T.T principle from ACSM for exercise prescriptions  Reviews F.I.T.T. principles of resistance exercise including progression. Written material given at graduation.    Education: Exercise & Equipment Safety: - Individual verbal instruction and demonstration of equipment use and safety with use of the equipment. Flowsheet Row Pulmonary Rehab from 08/17/2023 in [facility] Cardiac and Pulmonary Rehab  Date 06/28/23  Educator MB  Instruction Review Code 1- Verbalizes Understanding       Education: Exercise Physiology & General Exercise Guidelines: - Group verbal and written instruction with models to review the exercise physiology of the cardiovascular system and associated critical values. Provides general exercise guidelines with specific guidelines to those with heart or lung disease.    Education: Flexibility, Balance, Mind/Body Relaxation: - Group verbal and visual presentation with interactive activity on the components of exercise prescription. Introduces F.I.T.T principle from ACSM for exercise prescriptions.  Reviews F.I.T.T. principles of flexibility and balance exercise training including progression. Also discusses the mind body connection.  Reviews various relaxation techniques to help reduce and manage stress  (i.e. Deep breathing, progressive muscle relaxation, and visualization). Balance handout provided to take home. Written material given at graduation.   Activity Barriers & Risk Stratification:  Activity Barriers & Cardiac Risk Stratification - 06/28/23 1528       Activity Barriers & Cardiac Risk Stratification   Activity Barriers Back Problems;Shortness of Breath             6 Minute Walk:  6 Minute Walk     Row Name 06/28/23 1525 10/05/23 0938       6 Minute Walk   Phase Initial Discharge    Distance 1040 feet 1390 feet    Distance % Change -- 33.6 %    Distance Feet Change -- 350 ft    Walk Time 6 minutes 6 minutes    # of Rest Breaks 0 0    MPH 1.97 2.63    METS 1.77 2.68    RPE 13 15    Perceived Dyspnea  2 2    VO2 Peak 6.19 --    Symptoms Yes (comment) Yes (comment)    Comments Bilateral hip pain Chest pain 4/10 resolved after the test concluded    Resting HR 73 bpm 72 bpm    Resting BP 112/66 --    Resting Oxygen Saturation  94 % 95 %    Exercise Oxygen Saturation  during 6 min walk 91 % 93 %    Max Ex. HR 90 bpm 106 bpm    Max Ex. BP 126/66 138/64    2 Minute Post BP 110/66 120/64      Interval HR   1 Minute HR 67 78    2 Minute HR 89 98    3 Minute HR 90 99    4 Minute HR 90 97    5 Minute HR 90 100    6 Minute HR 89 97    2 Minute Post HR 66 67    Interval Heart Rate? Yes Yes      Interval Oxygen   Interval Oxygen? Yes Yes    Baseline Oxygen Saturation % 94 % 95 %    1 Minute Oxygen Saturation % 93 % 94 %    1 Minute Liters of Oxygen 0 L 0 L    2 Minute Oxygen Saturation % 91 % 93 %    2 Minute Liters of Oxygen 0 L 0 L    3 Minute Oxygen Saturation % 92 % 93 %    3 Minute Liters of Oxygen 0 L 0 L    4 Minute Oxygen Saturation % 92 % 94 %    4 Minute Liters of Oxygen 0 L 0 L    5 Minute Oxygen Saturation % 93 % 93 %    5 Minute Liters of Oxygen 0 L 0 L    6 Minute Oxygen Saturation % 95 % 94 %    6 Minute Liters of Oxygen 0 L 0 L    2  Minute Post Oxygen Saturation % 98 % 95 %    2 Minute Post Liters of Oxygen 0 L 0 L            Oxygen Initial Assessment:  Oxygen Initial Assessment - 06/21/23 1337       Home Oxygen   Home Oxygen Device None  Sleep Oxygen Prescription CPAP    Liters per minute 0    Home Exercise Oxygen Prescription None    Home Resting Oxygen Prescription None    Compliance with Home Oxygen Use Yes      Initial 6 min Walk   Oxygen Used None      Program Oxygen Prescription   Program Oxygen Prescription None      Intervention   Short Term Goals To learn and exhibit compliance with exercise, home and travel O2 prescription;To learn and understand importance of monitoring SPO2 with pulse oximeter and demonstrate accurate use of the pulse oximeter.;To learn and understand importance of maintaining oxygen saturations>88%;To learn and demonstrate proper pursed lip breathing techniques or other breathing techniques. ;To learn and demonstrate proper use of respiratory medications    Long  Term Goals Exhibits compliance with exercise, home  and travel O2 prescription;Verbalizes importance of monitoring SPO2 with pulse oximeter and return demonstration;Maintenance of O2 saturations>88%;Exhibits proper breathing techniques, such as pursed lip breathing or other method taught during program session;Compliance with respiratory medication;Demonstrates proper use of MDI's             Oxygen Re-Evaluation:  Oxygen Re-Evaluation     Row Name 07/06/23 5621 07/25/23 0925 08/17/23 1040 09/05/23 1323 10/12/23 0945     Program Oxygen Prescription   Program Oxygen Prescription -- None None None None     Home Oxygen   Home Oxygen Device -- None None None None   Sleep Oxygen Prescription -- CPAP CPAP CPAP CPAP   Liters per minute -- 0 0 -- 0   Home Exercise Oxygen Prescription -- None None None None   Home Resting Oxygen Prescription -- None None None None   Compliance with Home Oxygen Use -- Yes Yes Yes  Yes     Goals/Expected Outcomes   Short Term Goals -- To learn and exhibit compliance with exercise, home and travel O2 prescription;To learn and understand importance of monitoring SPO2 with pulse oximeter and demonstrate accurate use of the pulse oximeter.;To learn and understand importance of maintaining oxygen saturations>88%;To learn and demonstrate proper pursed lip breathing techniques or other breathing techniques. ;To learn and demonstrate proper use of respiratory medications To learn and exhibit compliance with exercise, home and travel O2 prescription;To learn and understand importance of monitoring SPO2 with pulse oximeter and demonstrate accurate use of the pulse oximeter.;To learn and understand importance of maintaining oxygen saturations>88%;To learn and demonstrate proper pursed lip breathing techniques or other breathing techniques. ;To learn and demonstrate proper use of respiratory medications To learn and exhibit compliance with exercise, home and travel O2 prescription;To learn and understand importance of monitoring SPO2 with pulse oximeter and demonstrate accurate use of the pulse oximeter.;To learn and understand importance of maintaining oxygen saturations>88%;To learn and demonstrate proper pursed lip breathing techniques or other breathing techniques. ;To learn and demonstrate proper use of respiratory medications To learn and exhibit compliance with exercise, home and travel O2 prescription;To learn and understand importance of monitoring SPO2 with pulse oximeter and demonstrate accurate use of the pulse oximeter.;To learn and understand importance of maintaining oxygen saturations>88%;To learn and demonstrate proper pursed lip breathing techniques or other breathing techniques. ;To learn and demonstrate proper use of respiratory medications   Long  Term Goals -- Exhibits compliance with exercise, home  and travel O2 prescription;Verbalizes importance of monitoring SPO2 with pulse  oximeter and return demonstration;Maintenance of O2 saturations>88%;Exhibits proper breathing techniques, such as pursed lip breathing or other method taught during program  session;Compliance with respiratory medication;Demonstrates proper use of MDI's Exhibits compliance with exercise, home  and travel O2 prescription;Verbalizes importance of monitoring SPO2 with pulse oximeter and return demonstration;Maintenance of O2 saturations>88%;Exhibits proper breathing techniques, such as pursed lip breathing or other method taught during program session;Compliance with respiratory medication;Demonstrates proper use of MDI's Exhibits compliance with exercise, home  and travel O2 prescription;Verbalizes importance of monitoring SPO2 with pulse oximeter and return demonstration;Maintenance of O2 saturations>88%;Exhibits proper breathing techniques, such as pursed lip breathing or other method taught during program session;Compliance with respiratory medication;Demonstrates proper use of MDI's Exhibits compliance with exercise, home  and travel O2 prescription;Verbalizes importance of monitoring SPO2 with pulse oximeter and return demonstration;Maintenance of O2 saturations>88%;Exhibits proper breathing techniques, such as pursed lip breathing or other method taught during program session;Compliance with respiratory medication;Demonstrates proper use of MDI's   Comments Reviewed PLB technique with pt.  Talked about how it works and it's importance in maintaining their exercise saturations. Reviewed PLB technique with her. SHowed her how it works and when to use it. Nikiyah is noticing improvement on her breathing since starting the program. She has been practicing her PLB with success. She attended the chronic lung disease review class today and enjoyed it. She reads articles on sarcoidosis attempting to find more information since it isn't discussed much. She plans to ask her doctor more questions at their next appt Cherylyn  continues to practice PLB which she believes has helped her breathing in the program. She has experienced some SOB outside of rehab when rushing to get places and walking up inclines. We encouraged her to practice PLB when these situations arise. We also discussed some exercise techniques for her to become more conditioned to walking up inclines. Mersadie understands when and how to utilize her breathing techniques. She still experiences some SOB when walking uphill, and knows to use breathing techniques to help with those symptoms. Discussed the importance of conditioning to improve her stamina and SOB.   Goals/Expected Outcomes Short: Become more profiecient at using PLB. Long: Become independent at using PLB. Short: practive using PLB when short of breath and looking to breath easier. Long: become independed at using PLB Short: attend pulmonary rehab for exercise and breathing techniques and ask her doctor her questions on her disease. Long: independenlty manage her breathing. Short: Continue to attend pulmonary rehab for exercise and breathing techniques. Long: Independenlty manage SOB. Short: Continue to attend pulmonary rehab for exercise and breathing techniques. Long: Independenlty manage SOB.            Oxygen Discharge (Final Oxygen Re-Evaluation):  Oxygen Re-Evaluation - 10/12/23 0945       Program Oxygen Prescription   Program Oxygen Prescription None      Home Oxygen   Home Oxygen Device None    Sleep Oxygen Prescription CPAP    Liters per minute 0    Home Exercise Oxygen Prescription None    Home Resting Oxygen Prescription None    Compliance with Home Oxygen Use Yes      Goals/Expected Outcomes   Short Term Goals To learn and exhibit compliance with exercise, home and travel O2 prescription;To learn and understand importance of monitoring SPO2 with pulse oximeter and demonstrate accurate use of the pulse oximeter.;To learn and understand importance of maintaining oxygen  saturations>88%;To learn and demonstrate proper pursed lip breathing techniques or other breathing techniques. ;To learn and demonstrate proper use of respiratory medications    Long  Term Goals Exhibits compliance with exercise, home  and travel O2 prescription;Verbalizes importance of monitoring SPO2 with pulse oximeter and return demonstration;Maintenance of O2 saturations>88%;Exhibits proper breathing techniques, such as pursed lip breathing or other method taught during program session;Compliance with respiratory medication;Demonstrates proper use of MDI's    Comments Jaziya understands when and how to utilize her breathing techniques. She still experiences some SOB when walking uphill, and knows to use breathing techniques to help with those symptoms. Discussed the importance of conditioning to improve her stamina and SOB.    Goals/Expected Outcomes Short: Continue to attend pulmonary rehab for exercise and breathing techniques. Long: Independenlty manage SOB.             Initial Exercise Prescription:  Initial Exercise Prescription - 06/28/23 1500       Date of Initial Exercise RX and Referring Provider   Date 06/28/23    Referring Provider Vida Rigger, MD      Oxygen   Maintain Oxygen Saturation 88% or higher      Recumbant Bike   Level 1    RPM 50    Watts 15    Minutes 15    METs 1.77      NuStep   Level 1    SPM 80    Minutes 15    METs 1.77      Arm Ergometer   Level 1    RPM 30    Minutes 15    METs 1.77      Biostep-RELP   Level 1    SPM 50    Minutes 15    METs 1.77      Track   Laps 14    Minutes 15    METs 1.76      Prescription Details   Frequency (times per week) 3    Duration Progress to 30 minutes of continuous aerobic without signs/symptoms of physical distress      Intensity   THRR 40-80% of Max Heartrate 102-132    Ratings of Perceived Exertion 11-13    Perceived Dyspnea 0-4      Progression   Progression Continue to progress  workloads to maintain intensity without signs/symptoms of physical distress.      Resistance Training   Training Prescription Yes    Weight 2 lb    Reps 10-15             Perform Capillary Blood Glucose checks as needed.  Exercise Prescription Changes:   Exercise Prescription Changes     Row Name 06/28/23 1500 07/20/23 1400 08/01/23 1400 08/10/23 1000 08/17/23 1600     Response to Exercise   Blood Pressure (Admit) 112/66 124/76 104/58 -- 108/60   Blood Pressure (Exercise) 126/66 124/70 144/72 -- --   Blood Pressure (Exit) 110/66 118/68 110/58 -- 108/62   Heart Rate (Admit) 73 bpm 64 bpm 71 bpm -- 61 bpm   Heart Rate (Exercise) 90 bpm 88 bpm 88 bpm -- 92 bpm   Heart Rate (Exit) 66 bpm 65 bpm 74 bpm -- 68 bpm   Oxygen Saturation (Admit) 94 % 95 % 95 % -- 94 %   Oxygen Saturation (Exercise) 91 % 91 % 90 % -- 91 %   Oxygen Saturation (Exit) 98 % 95 % 92 % -- 96 %   Rating of Perceived Exertion (Exercise) 13 13 15  -- 14   Perceived Dyspnea (Exercise) 2 2 3  -- 3   Symptoms bilateral hip pain none -- -- none   Comments results First three days  of exercise -- -- --   Duration Progress to 30 minutes of  aerobic without signs/symptoms of physical distress Progress to 30 minutes of  aerobic without signs/symptoms of physical distress Continue with 30 min of aerobic exercise without signs/symptoms of physical distress. -- Continue with 30 min of aerobic exercise without signs/symptoms of physical distress.   Intensity THRR New THRR unchanged THRR unchanged -- THRR unchanged     Progression   Progression Continue to progress workloads to maintain intensity without signs/symptoms of physical distress. Continue to progress workloads to maintain intensity without signs/symptoms of physical distress. Continue to progress workloads to maintain intensity without signs/symptoms of physical distress. -- Continue to progress workloads to maintain intensity without signs/symptoms of physical  distress.   Average METs 1.77 2.33 2.58 -- 2.71     Resistance Training   Training Prescription -- Yes Yes -- Yes   Weight -- 3 lb 3 lb -- 3 lb   Reps -- 10-15 10-15 -- 10-15     Interval Training   Interval Training -- No No -- No     Recumbant Bike   Level -- 1 2 -- 2   Watts -- 25 25 -- 25   Minutes -- 15 15 -- 15   METs -- 2.95 2.96 -- 2.96     NuStep   Level -- 2 3 -- 4   Minutes -- 30 15 -- 15   METs -- 2.2 2.5 -- 3     Arm Ergometer   Level -- -- 1 -- --   Minutes -- -- 15 -- --   METs -- -- 1.9 -- --     Biostep-RELP   Level -- -- 2 -- 3   Minutes -- -- 15 -- 15   METs -- -- 3 -- 2.8     Track   Laps -- 25 35 -- 15   Minutes -- 15 15 -- 15   METs -- 2.36 2.9 -- 1.82     Home Exercise Plan   Plans to continue exercise at -- -- -- Home (comment)  Recumbent at home, walking outside (weather permitting) Home (comment)  Recumbent at home, walking outside (weather permitting)   Frequency -- -- -- Add 2 additional days to program exercise sessions. Add 2 additional days to program exercise sessions.   Initial Home Exercises Provided -- -- -- 08/10/23 08/10/23     Oxygen   Maintain Oxygen Saturation -- 88% or higher 88% or higher 88% or higher 88% or higher    Row Name 08/29/23 0800 09/13/23 1300 09/27/23 0900 10/10/23 0700       Response to Exercise   Blood Pressure (Admit) 106/54 108/58 108/66 98/58    Blood Pressure (Exit) 102/62 108/66 104/56 110/64    Heart Rate (Admit) 75 bpm 61 bpm 66 bpm 68 bpm    Heart Rate (Exercise) 93 bpm 90 bpm 103 bpm 96 bpm    Heart Rate (Exit) 77 bpm 76 bpm 74 bpm 72 bpm    Oxygen Saturation (Admit) 85 % 92 % 95 % 98 %    Oxygen Saturation (Exercise) 91 % 90 % 92 % 92 %    Oxygen Saturation (Exit) 92 % 94 % 93 % 94 %    Rating of Perceived Exertion (Exercise) 14 14 15 15     Perceived Dyspnea (Exercise) 3 1 2 3     Symptoms none none none none    Duration Continue with 30 min of aerobic exercise without  signs/symptoms of  physical distress. Continue with 30 min of aerobic exercise without signs/symptoms of physical distress. Continue with 30 min of aerobic exercise without signs/symptoms of physical distress. Continue with 30 min of aerobic exercise without signs/symptoms of physical distress.    Intensity THRR unchanged THRR unchanged THRR unchanged THRR unchanged      Progression   Progression Continue to progress workloads to maintain intensity without signs/symptoms of physical distress. Continue to progress workloads to maintain intensity without signs/symptoms of physical distress. Continue to progress workloads to maintain intensity without signs/symptoms of physical distress. Continue to progress workloads to maintain intensity without signs/symptoms of physical distress.    Average METs 2.87 2.3 2.7 2.7      Resistance Training   Training Prescription Yes Yes Yes Yes    Weight 3 lb 3 lb 3 lb 3 lb    Reps 10-15 10-15 10-15 10-15      Interval Training   Interval Training No No No No      Treadmill   MPH -- 1 2.2 2.3    Grade -- 0 0 0.5    Minutes -- 15 15 15     METs -- 1.77 2.84 2.92      Recumbant Bike   Level 1 1 1  --    Watts 19 19 19  --    Minutes 15 15 15  --    METs 2.74 2.75 2.75 --      NuStep   Level 4 3 3 3     Minutes 15 15 15 15     METs 3.1 2.8 3.3 3.2      Biostep-RELP   Level -- -- -- 3    Minutes -- -- -- 15    METs -- -- -- 2      Track   Laps 30 16 -- --    Minutes 15 15 -- --    METs 2.63 1.87 -- --      Home Exercise Plan   Plans to continue exercise at Home (comment)  Recumbent at home, walking outside (weather permitting) Home (comment)  Recumbent at home, walking outside (weather permitting) Home (comment)  Recumbent at home, walking outside (weather permitting) Home (comment)  Recumbent at home, walking outside (weather permitting)    Frequency Add 2 additional days to program exercise sessions. Add 2 additional days to program exercise sessions. Add 2  additional days to program exercise sessions. Add 2 additional days to program exercise sessions.    Initial Home Exercises Provided 08/10/23 08/10/23 08/10/23 08/10/23      Oxygen   Maintain Oxygen Saturation 88% or higher 88% or higher 88% or higher 88% or higher             Exercise Comments:   Exercise Comments     Row Name 07/06/23 1610 10/24/23 1002         Exercise Comments First full day of exercise!  Patient was oriented to gym and equipment including functions, settings, policies, and procedures.  Patient's individual exercise prescription and treatment plan were reviewed.  All starting workloads were established based on the results of the 6 minute walk test done at initial orientation visit.  The plan for exercise progression was also introduced and progression will be customized based on patient's performance and goals. Akesha graduated today from  rehab with 35 sessions completed.  Details of the patient's exercise prescription and what She needs to do in order to continue the prescription and progress were discussed with patient.  Patient was given a copy of prescription and goals.  Patient verbalized understanding. Alayasia plans to continue to exercise by walking at home.               Exercise Goals and Review:   Exercise Goals     Row Name 06/28/23 1536             Exercise Goals   Increase Physical Activity Yes       Intervention Provide advice, education, support and counseling about physical activity/exercise needs.;Develop an individualized exercise prescription for aerobic and resistive training based on initial evaluation findings, risk stratification, comorbidities and participant's personal goals.       Expected Outcomes Short Term: Attend rehab on a regular basis to increase amount of physical activity.;Long Term: Add in home exercise to make exercise part of routine and to increase amount of physical activity.;Long Term: Exercising regularly at  least 3-5 days a week.       Increase Strength and Stamina Yes       Intervention Provide advice, education, support and counseling about physical activity/exercise needs.;Develop an individualized exercise prescription for aerobic and resistive training based on initial evaluation findings, risk stratification, comorbidities and participant's personal goals.       Expected Outcomes Short Term: Increase workloads from initial exercise prescription for resistance, speed, and METs.;Short Term: Perform resistance training exercises routinely during rehab and add in resistance training at home;Long Term: Improve cardiorespiratory fitness, muscular endurance and strength as measured by increased METs and functional capacity ( )       Able to understand and use rate of perceived exertion (RPE) scale Yes       Intervention Provide education and explanation on how to use RPE scale       Expected Outcomes Short Term: Able to use RPE daily in rehab to express subjective intensity level;Long Term:  Able to use RPE to guide intensity level when exercising independently       Able to understand and use Dyspnea scale Yes       Intervention Provide education and explanation on how to use Dyspnea scale       Expected Outcomes Short Term: Able to use Dyspnea scale daily in rehab to express subjective sense of shortness of breath during exertion;Long Term: Able to use Dyspnea scale to guide intensity level when exercising independently       Knowledge and understanding of Target Heart Rate Range (THRR) Yes       Intervention Provide education and explanation of THRR including how the numbers were predicted and where they are located for reference       Expected Outcomes Short Term: Able to state/look up THRR;Long Term: Able to use THRR to govern intensity when exercising independently;Short Term: Able to use daily as guideline for intensity in rehab       Able to check pulse independently Yes       Intervention  Provide education and demonstration on how to check pulse in carotid and radial arteries.;Review the importance of being able to check your own pulse for safety during independent exercise       Expected Outcomes Short Term: Able to explain why pulse checking is important during independent exercise;Long Term: Able to check pulse independently and accurately       Understanding of Exercise Prescription Yes       Intervention Provide education, explanation, and written materials on patient's individual exercise prescription       Expected Outcomes Short  Term: Able to explain program exercise prescription;Long Term: Able to explain home exercise prescription to exercise independently                Exercise Goals Re-Evaluation :  Exercise Goals Re-Evaluation     Row Name 07/06/23 0937 07/20/23 1500 07/25/23 0919 08/01/23 1500 08/10/23 1010     Exercise Goal Re-Evaluation   Exercise Goals Review Able to understand and use rate of perceived exertion (RPE) scale;Able to understand and use Dyspnea scale;Knowledge and understanding of Target Heart Rate Range (THRR);Understanding of Exercise Prescription Increase Physical Activity;Increase Strength and Stamina;Understanding of Exercise Prescription Increase Physical Activity;Increase Strength and Stamina;Understanding of Exercise Prescription Increase Physical Activity;Increase Strength and Stamina;Understanding of Exercise Prescription Increase Physical Activity;Increase Strength and Stamina;Understanding of Exercise Prescription;Able to understand and use Dyspnea scale;Knowledge and understanding of Target Heart Rate Range (THRR);Able to understand and use rate of perceived exertion (RPE) scale;Able to check pulse independently   Comments Reviewed RPE and dyspnea scale, THR and program prescription with pt today.  Pt voiced understanding and was given a copy of goals to take home. Duchess is off to a good start in the program. She was able to walk up to  25 laps on the track and work at level 1 on the recumbent bike. She also improved to level 2 on the T4 nustep and increased to 3 lb hand weights for resistance training. We will continue to monitor her progress in the program. She is doing well with exercise at rehab, currently on level 2 with the T4. was able to get 30laps walking the track. Is still doing well with 3lbs with hand weight. She is not exercising much at home, walking to the mailbox and back, ~1/4 mile daily. Encouraged her to walk and look for exercises she can do at home, to speak with exercise team here at rehab if she has questions. Anuradha continues to do well in rehab. She has increased her level to 3 on the T4 nustep and increased her laps to 35 on the track. She has also increased her level to 2 on the recumbent bike and biostep. We will continue to monitor her progress in the program. Reviewed home exercise with pt today from 09:20 to 09:34.  Pt plans to use recumbent bike at home and walk outside on days away from rehab for exercise.  Reviewed THR, pulse, RPE, sign and symptoms, pulse oximetery and when to call 911 or MD.  Also discussed weather considerations and indoor options.  Pt voiced understanding.   Expected Outcomes Short: Use RPE daily to regulate intensity. Long: Follow program prescription in THR. Short: Continue to follow current exercise prescription. Long: Continue exercise to improve strength and stamina. Short: Continue to follow current exercise at rehab and at home. Long: Continue exercise and improve strength and stamina Short: Continue to progressively increase workloads with the nustep, recumbent bike, biostep, and track. Long: Continue exercise to improve strength and stamina. Short: Begin using recumbent bike at home on days away from rehab. Long: Continue to exercise independently.    Row Name 08/17/23 1656 08/29/23 0810 09/05/23 0957 09/13/23 1342 09/27/23 0940     Exercise Goal Re-Evaluation   Exercise Goals  Review Increase Physical Activity;Increase Strength and Stamina;Understanding of Exercise Prescription Increase Physical Activity;Increase Strength and Stamina;Understanding of Exercise Prescription Increase Physical Activity;Increase Strength and Stamina;Understanding of Exercise Prescription Increase Physical Activity;Increase Strength and Stamina;Understanding of Exercise Prescription Increase Physical Activity;Increase Strength and Stamina;Understanding of Exercise Prescription   Comments Jozlyn  is doing well in rehab. She was able to increase her level on the T4 nustep from level 3 to 4. She was also able to increase her level on the biostep from level 2 to 3. We will continue to monitor her progress in the program. Basilia continues to do well in the program. She continues to work at level 4 on the T4 nustep and level 1 on the recumbent bike. She also continues to walk 30 laps on the track and uses 3 lb hand weights for resistance training. We will continue to monitor her progress in the program. Leverne is doing well in the program. She states that she has not yet started exercising on her days away from rehab as she was busy around the holidays. She plans to start adding in home exercise by walking at home and using her recumbent bike. She also mentions that she and her husband are looking into possibly joining a gym. We will continue to monitor her progress int the program. Likisha is doing well in rehab. She recently used the treadmill for the first time at a speed of . She was also able to maintain her level on the recumbent bike, and decreased to level 3 on the T4 nustep. We will continue to monitor her progression in the program. Anays continues to do well in rehab. She has gotten more comfortable with the treadmill and increased her workload with a speed of 2.2 mph and 0.5% incline. She has maintained level 3 on the T4 nustep. We will continue to monitor (his/her) progress in the program.    Expected Outcomes Short: Try level 3 on the recumbent bike. Long: Continue to exercise to improve strength and stamina. Short: Try improving to level 2 on the recumbent bike. Long: Continue to exercise to improve strength and stamina. Short: Begin walking and using recumbent bike on days away from rehab. Long: Continue exercise to improve strength and stamina. Short: Continue to use and increase treadmill workloads. Long: Continue exercise to increase strength and stamina. Short: Continue to progressively increase treadmill workload and level on the T4 nustep. Long: Continue exercise to improve strength and stamina.    Row Name 10/10/23 1610 10/12/23 0949           Exercise Goal Re-Evaluation   Exercise Goals Review Increase Physical Activity;Increase Strength and Stamina;Understanding of Exercise Prescription Increase Physical Activity;Increase Strength and Stamina;Understanding of Exercise Prescription      Comments Selah is doing well in the program. She recently completed her post-6MWT and was able to improve by 33.6%. She was also able to increase her workload on the treadmill to a speed of 2. and an incline of 0.5%. We will continue to monitor her pogress in the program. Monalisa still plans on joining a gym after she completes the program. She states that there isnt many options in her area that take her insurance. She has begun to utilize her home exercise handouts to help her stay active at home. She still walks outside with her husband but allows herself to recover from days she attends the program.      Expected Outcomes Short: Continue to progressively increase treadmill workload. Long: Continue exercise to improve strength and stamina. Short: Continue to search for a program alternative for post-grad. Long: Continue exercise to improve strength and stamina.               Discharge Exercise Prescription (Final Exercise Prescription Changes):  Exercise Prescription Changes - 10/10/23  0700  Response to Exercise   Blood Pressure (Admit) 98/58    Blood Pressure (Exit) 110/64    Heart Rate (Admit) 68 bpm    Heart Rate (Exercise) 96 bpm    Heart Rate (Exit) 72 bpm    Oxygen Saturation (Admit) 98 %    Oxygen Saturation (Exercise) 92 %    Oxygen Saturation (Exit) 94 %    Rating of Perceived Exertion (Exercise) 15    Perceived Dyspnea (Exercise) 3    Symptoms none    Duration Continue with 30 min of aerobic exercise without signs/symptoms of physical distress.    Intensity THRR unchanged      Progression   Progression Continue to progress workloads to maintain intensity without signs/symptoms of physical distress.    Average METs 2.7      Resistance Training   Training Prescription Yes    Weight 3 lb    Reps 10-15      Interval Training   Interval Training No      Treadmill   MPH 2.3    Grade 0.5    Minutes 15    METs 2.92      NuStep   Level 3    Minutes 15    METs 3.2      Biostep-RELP   Level 3    Minutes 15    METs 2      Home Exercise Plan   Plans to continue exercise at Home (comment)   Recumbent at home, walking outside (weather permitting)   Frequency Add 2 additional days to program exercise sessions.    Initial Home Exercises Provided 08/10/23      Oxygen   Maintain Oxygen Saturation 88% or higher             Nutrition:  Target Goals: Understanding of nutrition guidelines, daily intake of sodium 1500mg , cholesterol 200mg , calories 30% from fat and 7% or less from saturated fats, daily to have 5 or more servings of fruits and vegetables.  Education: All About Nutrition: -Group instruction provided by verbal, written material, interactive activities, discussions, models, and posters to present general guidelines for heart healthy nutrition including fat, fiber, MyPlate, the role of sodium in heart healthy nutrition, utilization of the nutrition label, and utilization of this knowledge for meal planning. Follow up email sent  as well. Written material given at graduation.   Biometrics:  Pre Biometrics - 06/28/23 1537       Pre Biometrics   Height 5' 2.1" (1.577 m)    Weight 182 lb 3.2 oz (82.6 kg)    Waist Circumference 43 inches    Hip Circumference 45.5 inches    Waist to Hip Ratio 0.95 %    BMI (Calculated) 33.23    Single Leg Stand 12 seconds             Post Biometrics - 10/05/23 0944        Post  Biometrics   Height 5' 2.09" (1.577 m)    Weight 175 lb 1.6 oz (79.4 kg)    Waist Circumference 40.5 inches    Hip Circumference 39.5 inches    Waist to Hip Ratio 1.03 %    BMI (Calculated) 31.94    Single Leg Stand 13.35 seconds             Nutrition Therapy Plan and Nutrition Goals:  Nutrition Therapy & Goals - 06/28/23 1602       Nutrition Therapy   Diet Cardiac, Low Na  Protein (specify units) 75    Fiber 25 grams    Whole Grain Foods 3 servings    Saturated Fats 15 max. grams    Fruits and Vegetables 5 servings/day    Sodium 2 grams      Personal Nutrition Goals   Nutrition Goal Drink ~3 bottles of water per day    Personal Goal #2 Eat a protein and carb at each meal    Personal Goal #3 Use small nutrient dense snacks when not very hungry    Comments Patient drinking 32oz of water daily, spoke about increasing to 48oz. She says sweets are her biggest struggle. Recommended some healthier sweet treat ideas to try. Encouraged her to drink some water before snacking. She reports she is trying to lose weight, doesn't eat a lot but still struggling to lose the weight. Spoke to her about nutrient and calorie density, encouraged her to eat smaller but more nutrient dense foods to help her meet her nutrient goals like protein, fiber, and calories. Reviewed mediterranean diet handout, education on types of fats, sources, and how to read label. Built out several meals and snacks with foods she likes and will eat.      Intervention Plan   Intervention Prescribe, educate and counsel  regarding individualized specific dietary modifications aiming towards targeted core components such as weight, hypertension, lipid management, diabetes, heart failure and other comorbidities.;Nutrition handout(s) given to patient.    Expected Outcomes Short Term Goal: Understand basic principles of dietary content, such as calories, fat, sodium, cholesterol and nutrients.;Short Term Goal: A plan has been developed with personal nutrition goals set during dietitian appointment.;Long Term Goal: Adherence to prescribed nutrition plan.             Nutrition Assessments:  MEDIFICTS Score Key: >=70 Need to make dietary changes  40-70 Heart Healthy Diet <= 40 Therapeutic Level Cholesterol Diet  Flowsheet Row Pulmonary Rehab from 10/10/2023 in [facility]  Picture Your Plate Total Score on Discharge 68      Picture Your Plate Scores: <78 Unhealthy dietary pattern with much room for improvement. 41-50 Dietary pattern unlikely to meet recommendations for good health and room for improvement. 51-60 More healthful dietary pattern, with some room for improvement.  >60 Healthy dietary pattern, although there may be some specific behaviors that could be improved.   Nutrition Goals Re-Evaluation:  Nutrition Goals Re-Evaluation     Row Name 07/25/23 0933 08/17/23 1046 09/05/23 1306 10/12/23 0934       Goals   Nutrition Goal Drink 3 bottles of water per day -- -- Drink 3 bottles of water per day    Comment She isnt drinking as much water as she should be. Spoke with her baout the barriers to this goals, concluded together that she is likely forgetting. Suggested she set up a few alarms on her phone to remind her. She agrees that would be a good idea as she uses alarms for other things in her daily routine. Also spoke with her about eating adequate protein and carbs to support energy and increased needs. She is doing that most meals but sometimes doesnt feel hungry and will miss  dinner or use a snack instead. Rhiannon is still trying to increase her water intake. She is carrying her water around more which is an improvement. She has been using alarms to help remind herself. Her appetite is about the same and she notes she has been extra busy during this holiday season. [place]  is still trying to increase her water intake. She states that she is doing better, but could still improve. She is still dealing with her poor appetite, but is trying to include more protein in meals, especially with her breakfast. Jordayn is still managing her water intake. She talked to her doctor about her weight loss goals and that she had been unable to lose. She was recently diagnosed with insulin resistance and was prescribed metformin. She is still trying to keep her protein intake constant, and is trying to eat at least 2 meals a day.    Expected Outcome Short: Drink 3 bottles of water and dont miss meals. Long: Maintain healthy hydration and eating habits Short: set reminders for water and healthy eating during this busy holiday season. Long:  independently manage healthy eating habits Short: Continue to drink more water throughout the day. Long: Continue to practice dietary patterns discussed with RD. Short: Continue to drink more water, and try to increase meals to 3. Long: Continue to practice dietary patterns discussed with RD.             Nutrition Goals Discharge (Final Nutrition Goals Re-Evaluation):  Nutrition Goals Re-Evaluation - 10/12/23 0934       Goals   Nutrition Goal Drink 3 bottles of water per day    Comment Jyoti is still managing her water intake. She talked to her doctor about her weight loss goals and that she had been unable to lose. She was recently diagnosed with insulin resistance and was prescribed metformin. She is still trying to keep her protein intake constant, and is trying to eat at least 2 meals a day.    Expected Outcome Short: Continue to drink more water, and  try to increase meals to 3. Long: Continue to practice dietary patterns discussed with RD.             Psychosocial: Target Goals: Acknowledge presence or absence of significant depression and/or stress, maximize coping skills, provide positive support system. Participant is able to verbalize types and ability to use techniques and skills needed for reducing stress and depression.   Education: Stress, Anxiety, and Depression - Group verbal and visual presentation to define topics covered.  Reviews how body is impacted by stress, anxiety, and depression.  Also discusses healthy ways to reduce stress and to treat/manage anxiety and depression.  Written material given at graduation.   Education: Sleep Hygiene -Provides group verbal and written instruction about how sleep can affect your health.  Define sleep hygiene, discuss sleep cycles and impact of sleep habits. Review good sleep hygiene tips.    Initial Review & Psychosocial Screening:  Initial Psych Review & Screening - 06/21/23 1339       Initial Review   Current issues with Current Anxiety/Panic      Family Dynamics   Good Support System? Yes    Comments Her husband has some dementia and other health issues. She can call her daughters if she gets anxiety she has some medication for it.      Barriers   Psychosocial barriers to participate in program The patient should benefit from training in stress management and relaxation.      Screening Interventions   Interventions Encouraged to exercise;To provide support and resources with identified psychosocial needs;Provide feedback about the scores to participant    Expected Outcomes Short Term goal: Utilizing psychosocial counselor, staff and physician to assist with identification of specific Stressors or current issues interfering with healing process. Setting desired  goal for each stressor or current issue identified.;Long Term Goal: Stressors or current issues are controlled or  eliminated.;Short Term goal: Identification and review with participant of any Quality of Life or Depression concerns found by scoring the questionnaire.;Long Term goal: The participant improves quality of Life and PHQ9 Scores as seen by post scores and/or verbalization of changes             Quality of Life Scores:  Scores of 19 and below usually indicate a poorer quality of life in these areas.  A difference of  2-3 points is a clinically meaningful difference.  A difference of 2-3 points in the total score of the Quality of Life Index has been associated with significant improvement in overall quality of life, self-image, physical symptoms, and general health in studies assessing change in quality of life.  PHQ-9: Review Flowsheet       10/10/2023 06/28/2023  Depression screen PHQ 2/9  Decreased Interest 0 0  Down, Depressed, Hopeless 0 0  PHQ - 2 Score 0 0  Altered sleeping 0 0  Tired, decreased energy 1 1  Change in appetite 2 1  Feeling bad or failure about yourself  0 1  Trouble concentrating 0 0  Moving slowly or fidgety/restless 0 0  Suicidal thoughts 0 0  PHQ-9 Score 3 3  Difficult doing work/chores Not difficult at all -   Interpretation of Total Score  Total Score Depression Severity:  1-4 = Minimal depression, 5-9 = Mild depression, 10-14 = Moderate depression, 15-19 = Moderately severe depression, 20-27 = Severe depression   Psychosocial Evaluation and Intervention:  Psychosocial Evaluation - 06/21/23 1342       Psychosocial Evaluation & Interventions   Interventions Encouraged to exercise with the program and follow exercise prescription;Relaxation education;Stress management education    Comments Her husband has some dementia and other health issues. She can call her daughters if she gets anxiety she has some medication for it.    Expected Outcomes Short: Start [facility] to help with mood. Long: Maintain a healthy mental state.    Continue Psychosocial  Services  Follow up required by staff             Psychosocial Re-Evaluation:  Psychosocial Re-Evaluation     Row Name 07/25/23 0930 08/17/23 1035 09/05/23 1302 10/12/23 1610       Psychosocial Re-Evaluation   Current issues with None Identified Current Stress Concerns Current Stress Concerns Current Stress Concerns    Comments She is not dealing with any drepssion or anxiety stressors. She reports she has good social support and relies in prayer and friends. holidays are busy and she needs to get the house ready. Benigna is happy that she can notice some results in the 16 sessions she has been here. She notes her shortness of breath is not as severe doing certain activities. She mentions a some of her family relies on her for transportation and to keep things organized during the holidays. She does have a great support system, but sometimes it weight on her. Her husband's dementia is stable for now and they are looking forward to holiday events because he enjoys eating with friends and makes him happy. 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 She finds comfort in her church and family Kingsley reports no major stress concerns at this time. She states that she is still very aware of the reality that her husband is dealing with dementia and parkinsons,  however she beleives it is just part of life. She also has a lot of appointments for both her and her husband and she is the one who drives them tot these appointments. She does enjoy reading and doing crafts for stress relief. She also gets together with a ladies group and plays cards which she finds very enjoyable. Katana is having some stress as of right now. Her husband has parkinsons and dementia that continues to give her stress. He recently had a fall, which then turned into an infection, which caused her to have a lot of stress last week. She says that the program, and her womens group has helped her relieve a lot of her  current stress.    Expected Outcomes short: stay focus and postive during holidays.Long: stay positive and use social support Short: attend pulmonary rehab for education on stress reduction. Long: maintain positive self care habits Short: Continue to manage stress relief through healthy avenues. Long: Maintain positive outlook. Short: Continue to exercise and look to her support groups for healthy stress relief. Long: Continue to maintain a positive outlook.    Interventions Encouraged to attend Pulmonary Rehabilitation for the exercise -- Encouraged to attend Pulmonary Rehabilitation for the exercise Encouraged to attend Pulmonary Rehabilitation for the exercise    Continue Psychosocial Services  Follow up required by staff Follow up required by staff Follow up required by staff Follow up required by staff             Psychosocial Discharge (Final Psychosocial Re-Evaluation):  Psychosocial Re-Evaluation - 10/12/23 0927       Psychosocial Re-Evaluation   Current issues with Current Stress Concerns    Comments Adrine is having some stress as of right now. Her husband has parkinsons and dementia that continues to give her stress. He recently had a fall, which then turned into an infection, which caused her to have a lot of stress last week. She says that the program, and her womens group has helped her relieve a lot of her current stress.    Expected Outcomes Short: Continue to exercise and look to her support groups for healthy stress relief. Long: Continue to maintain a positive outlook.    Interventions Encouraged to attend Pulmonary Rehabilitation for the exercise    Continue Psychosocial Services  Follow up required by staff             Education: Education Goals: Education classes will be provided on a weekly basis, covering required topics. Participant will state understanding/return demonstration of topics presented.  Learning Barriers/Preferences:  Learning Barriers/Preferences  - 06/21/23 1338       Learning Barriers/Preferences   Learning Barriers Hearing    Learning Preferences None             General Pulmonary Education Topics:  Infection Prevention: - Provides verbal and written material to individual with discussion of infection control including proper hand washing and proper equipment cleaning during exercise session. Flowsheet Row Pulmonary Rehab from 08/17/2023 in [facility] Card[facility] Pulmonary Rehab  Date 06/28/23  Educator MB  Instruction Review Code 1- Verbalizes Understanding       Falls Prevention: - Provides verbal and written material to individual with discussion of falls prevention and safety. Flowsheet Row Pulmonary Rehab from 08/17/2023 in [facility] Cardiac and Pulmonary Rehab  Date 06/28/23  Educator MB  Instruction Review Code 1- Verbalizes Understanding       Chronic Lung Disease Review: - Group verbal instruction with posters, models, PowerPoint presentations and  videos,  to review new updates, new respiratory medications, new advancements in procedures and treatments. Providing information on websites and "800" numbers for continued self-education. Includes information about supplement oxygen, available portable oxygen systems, continuous and intermittent flow rates, oxygen safety, concentrators, and Medicare reimbursement for oxygen. Explanation of Pulmonary Drugs, including class, frequency, complications, importance of spacers, rinsing mouth after steroid MDI's, and proper cleaning methods for nebulizers. Review of basic lung anatomy and physiology related to function, structure, and complications of lung disease. Review of risk factors. Discussion about methods for diagnosing sleep apnea and types of masks and machines for OSA. Includes a review of the use of types of environmental controls: home humidity, furnaces, filters, dust mite/pet prevention, HEPA vacuums. Discussion about weather changes, air quality and the benefits of  nasal washing. Instruction on Warning signs, infection symptoms, calling MD promptly, preventive modes, and value of vaccinations. Review of effective airway clearance, coughing and/or vibration techniques. Emphasizing that all should Create an Action Plan. Written material given at graduation. Flowsheet Row Pulmonary Rehab from 08/17/2023 in [facility] Cardiac and Pulmonary Rehab  Education need identified 06/28/23  Date 08/17/23  Educator [facility]  Instruction Review Code 1- Verbalizes Understanding       AED/CPR: - Group verbal and written instruction with the use of models to demonstrate the basic use of the AED with the basic ABC's of resuscitation.    Anatomy and Cardiac Procedures: - Group verbal and visual presentation and models provide information about basic cardiac anatomy and function. Reviews the testing methods done to diagnose heart disease and the outcomes of the test results. Describes the treatment choices: Medical Management, Angioplasty, or Coronary Bypass Surgery for treating various heart conditions including Myocardial Infarction, Angina, Valve Disease, and Cardiac Arrhythmias.  Written material given at graduation.   Medication Safety: - Group verbal and visual instruction to review commonly prescribed medications for heart and lung disease. Reviews the medication, class of the drug, and side effects. Includes the steps to properly store meds and maintain the prescription regimen.  Written material given at graduation.   Other: -Provides group and verbal instruction on various topics (see comments)   Knowledge Questionnaire Score:  Knowledge Questionnaire Score - 10/10/23 1006       Knowledge Questionnaire Score   Post Score 17/18              Core Components/Risk Factors/Patient Goals at Admission:  Personal Goals and Risk Factors at Admission - 06/28/23 1539       Core Components/Risk Factors/Patient Goals on Admission    Weight Management Weight Loss;Yes     Intervention Weight Management: Develop a combined nutrition and exercise program designed to reach desired caloric intake, while maintaining appropriate intake of nutrient and fiber, sodium and fats, and appropriate energy expenditure required for the weight goal.;Weight Management: Provide education and appropriate resources to help participant work on and attain dietary goals.;Weight Management/Obesity: Establish reasonable short term and long term weight goals.    Admit Weight 182 lb 3.2 oz (82.6 kg)    Goal Weight: Short Term 172 lb 3.2 oz (78.1 kg)    Goal Weight: Long Term 162 lb 3.2 oz (73.6 kg)    Expected Outcomes Short Term: Continue to assess and modify interventions until short term weight is achieved;Long Term: Adherence to nutrition and physical activity/exercise program aimed toward attainment of established weight goal;Weight Loss: Understanding of general recommendations for a balanced deficit meal plan, which promotes 1-2 lb weight loss per week and includes a  negative energy balance of [phone] kcal/d;Understanding recommendations for meals to include 15-35% energy as protein, 25-35% energy from fat, 35-60% energy from carbohydrates, less than 200mg  of dietary cholesterol, 20-35 gm of total fiber daily;Understanding of distribution of calorie intake throughout the day with the consumption of 4-5 meals/snacks    Improve shortness of breath with ADL's Yes    Intervention Provide education, individualized exercise plan and daily activity instruction to help decrease symptoms of SOB with activities of daily living.    Expected Outcomes Long Term: Be able to perform more ADLs without symptoms or delay the onset of symptoms;Short Term: Improve cardiorespiratory fitness to achieve a reduction of symptoms when performing ADLs    Hypertension Yes    Intervention Provide education on lifestyle modifcations including regular physical activity/exercise, weight management, moderate sodium  restriction and increased consumption of fresh fruit, vegetables, and low fat dairy, alcohol moderation, and smoking cessation.;Monitor prescription use compliance.    Expected Outcomes Short Term: Continued assessment and intervention until BP is < 140/55mm HG in hypertensive participants. < 130/78mm HG in hypertensive participants with diabetes, heart failure or chronic kidney disease.;Long Term: Maintenance of blood pressure at goal levels.    Lipids Yes    Intervention Provide education and support for participant on nutrition & aerobic/resistive exercise along with prescribed medications to achieve LDL 70mg , HDL >40mg .    Expected Outcomes Short Term: Participant states understanding of desired cholesterol values and is compliant with medications prescribed. Participant is following exercise prescription and nutrition guidelines.;Long Term: Cholesterol controlled with medications as prescribed, with individualized exercise RX and with personalized nutrition plan. Value goals: LDL < 70mg , HDL > 40 mg.             Education:Diabetes - Individual verbal and written instruction to review signs/symptoms of diabetes, desired ranges of glucose level fasting, after meals and with exercise. Acknowledge that pre and post exercise glucose checks will be done for 3 sessions at entry of program.   Know Your Numbers and Heart Failure: - Group verbal and visual instruction to discuss disease risk factors for cardiac and pulmonary disease and treatment options.  Reviews associated critical values for Overweight/Obesity, Hypertension, Cholesterol, and Diabetes.  Discusses basics of heart failure: signs/symptoms and treatments.  Introduces Heart Failure Zone chart for action plan for heart failure.  Written material given at graduation.   Core Components/Risk Factors/Patient Goals Review:   Goals and Risk Factor Review     Row Name 07/25/23 0935 08/17/23 1031 09/05/23 1318 10/12/23 0939       Core  Components/Risk Factors/Patient Goals Review   Personal Goals Review Weight Management/Obesity Improve shortness of breath with ADL's;Hypertension Hypertension;Improve shortness of breath with ADL's Hypertension;Improve shortness of breath with ADL's    Review Patient is eating better snacks, working on drinking more water, choosing healthy foods but is frustrated at not losing weight. Spoke with her about the importance of not missing meals as she sometimes does, how low caloire foods are often healthy but can make it hard to meet caloire goals when not eaten in larger quantities. Educated her on the use of healthy fats for calorie dense options. Neriyah has noticed an improvement in her breathing since starting the program. She notes that it is easier to go up and down the bleachers at her grandson's basketball game and to keep the same pace as her husband. Her blood pressure has been stable and she saw her primary care doctor this week and was given the all good  to keep taking her current medication. He did add metformin because she is becoming insulin resistant so they are trying to get ahead of it by medication and lifestyle management. Ariella states that her SOB has been well controlled while in the program, however, when she is rushing or going up an incline she tends to get more SOB. We discussed beginning to add incline on the treadmill to condition her, so that she will be able to walk up inclines without being as SOB. She also states that she checks her BP routinely throughout the week and has noticed that it has been lower. She also states that her HR has been lower, sometimes in the 50's which she believes has made her feel more fatigued. I encouraged her to talk to her doctor about her medications, as she feels this is the cause of her lower BP and heart rate. Oyinkansola has stated that her SOB has still been most noticeable while walking at an incline. She has begun utilizing the incline on the treadmill  in order to condition herself to a grade. She has noted some improvement with hills since introducing incline on her treadmill workloads. Noora has not been taking her BP at home as often as she was. She understands that if she feels off that she should check it.    Expected Outcomes Short: do not miss meals, use healthy fats to meet caloire needs for weight loss. Long: Maintain healthy eating habits to support weight loss Short: attend pulmonary rehab for supervised exercise education and take her metformin  Long: independently manage risk factors Short: Talk to doctor about medications and low heart rate. Long: Continue to manage lifestyle risk factors. Short: start taking BP at home. Long: Continue to manage risk factors.             Core Components/Risk Factors/Patient Goals at Discharge (Final Review):   Goals and Risk Factor Review - 10/12/23 0939       Core Components/Risk Factors/Patient Goals Review   Personal Goals Review Hypertension;Improve shortness of breath with ADL's    Review Chyanna has stated that her SOB has still been most noticeable while walking at an incline. She has begun utilizing the incline on the treadmill in order to condition herself to a grade. She has noted some improvement with hills since introducing incline on her treadmill workloads. Claire has not been taking her BP at home as often as she was. She understands that if she feels off that she should check it.    Expected Outcomes Short: start taking BP at home. Long: Continue to manage risk factors.             ITP Comments:  ITP Comments     Row Name 06/21/23 1336 06/28/23 1556 07/05/23 0834 07/06/23 0937 07/26/23 1123   ITP Comments Virtual Visit completed. Patient informed on EP and RD appointment and 6 Minute walk test. Patient also informed of patient health questionnaires on My Chart. Patient Verbalizes understanding. Visit diagnosis can be found in [facility] 06/13/2023. Completed and gym  orientation. Initial ITP created and sent for review to Dr. Jinny Sanders, Medical Director. 30 Day review completed. Medical Director ITP review done, changes made as directed, and signed approval by Medical Director.    new to program First full day of exercise!  Patient was oriented to gym and equipment including functions, settings, policies, and procedures.  Patient's individual exercise prescription and treatment plan were reviewed.  All starting workloads were established  based on the results of the 6 minute walk test done at initial orientation visit.  The plan for exercise progression was also introduced and progression will be customized based on patient's performance and goals. 30 Day review completed. Medical Director ITP review done, changes made as directed, and signed approval by Medical Director.    new to program    Row Name 08/23/23 0937 09/20/23 1244 10/18/23 0723 10/24/23 1002     ITP Comments 30 Day review completed. Medical Director ITP review done, changes made as directed, and signed approval by Medical Director. 30 Day review completed. Medical Director ITP review done, changes made as directed, and signed approval by Medical Director. 30 Day review completed. Medical Director ITP review done, changes made as directed, and signed approval by Medical Director. Shauni graduated today from  rehab with 35 sessions completed.  Details of the patient's exercise prescription and what She needs to do in order to continue the prescription and progress were discussed with patient.  Patient was given a copy of prescription and goals.  Patient verbalized understanding. Johana plans to continue to exercise by walking at home.             Comments: Discharge ITP

## 2024-05-24 ENCOUNTER — Other Ambulatory Visit: Payer: Self-pay | Admitting: Internal Medicine

## 2024-05-24 DIAGNOSIS — Z1231 Encounter for screening mammogram for malignant neoplasm of breast: Secondary | ICD-10-CM

## 2024-07-02 ENCOUNTER — Other Ambulatory Visit: Payer: Self-pay | Admitting: Pulmonary Disease

## 2024-07-02 DIAGNOSIS — D860 Sarcoidosis of lung: Secondary | ICD-10-CM

## 2024-07-02 DIAGNOSIS — E66811 Obesity, class 1: Secondary | ICD-10-CM

## 2024-07-11 ENCOUNTER — Ambulatory Visit
Admission: RE | Admit: 2024-07-11 | Discharge: 2024-07-11 | Disposition: A | Discharge status: Home / Self Care | Source: Ambulatory Visit | Attending: Pulmonary Disease | Admitting: Pulmonary Disease

## 2024-07-11 DIAGNOSIS — D860 Sarcoidosis of lung: Secondary | ICD-10-CM | POA: Diagnosis present

## 2024-07-11 DIAGNOSIS — E66811 Obesity, class 1: Secondary | ICD-10-CM | POA: Diagnosis present

## 2024-08-06 ENCOUNTER — Ambulatory Visit
Admission: RE | Admit: 2024-08-06 | Discharge: 2024-08-06 | Disposition: A | Discharge status: Home / Self Care | Source: Ambulatory Visit | Attending: Internal Medicine | Admitting: Internal Medicine

## 2024-08-06 DIAGNOSIS — Z1231 Encounter for screening mammogram for malignant neoplasm of breast: Secondary | ICD-10-CM | POA: Insufficient documentation

## 2024-08-13 ENCOUNTER — Other Ambulatory Visit: Payer: Self-pay | Admitting: Internal Medicine

## 2024-08-13 DIAGNOSIS — M542 Cervicalgia: Secondary | ICD-10-CM

## 2024-08-13 NOTE — Progress Notes (Signed)
 Megan Daniels is a 73 y.o. here for Medicare Wellness Visit  MEDICARE WELLNESS VISIT  Providers Rendering Care 1. Dr Reyes Costa (PCP) Pt in NAD. HTN stable on meds. Has HLD not on statin, prediabetes not on meds and asthma/sarcoidosis on inhaler tx. Also with CKD. Weight stable with BMI>31.  Feels OK. Sleeping well. No fever, some HA's. Some left chest discomfort. Some DOE and palpitations. No change in bowels or bladder. UTD with MMG and colon. Also with left ear pain. Has seen ENT.  Functional Assessment (1) Hearing: Demonstrates no difficulty in hearing during normal conversation (2) Risk of Falls: Patient denies any falls or near falls in the last year, Gait steady without assistance during walk from waiting area to exam room (3) Home Safety: Patient feels secure in their home, There are operational smoke alarms in multiple areas of the home (4) Activities of Daily Living: Independently manages personal grooming and household chores, including cooking, cleaning and laundry. Manages Personal finances without assistance.  Depression Screening PHQ 2/9 last 3 flowsheet values     11/04/2021   10:45 AM  PHQ-2/9 Depression Screening   (OBSOLETE) Little interest or pleasure in doing things 0  (OBSOLETE) Feeling down, depressed, or hopeless (or irritable for Teens only)? 0  (OBSOLETE) Total Prescreening Score 0  (OBSOLETE) Total Score = 0     Depression Severity and Treatment Recommendations:  0-4= None  5-9= Mild / Treatment: Support, educate to call if worse; return in one month  10-14= Moderate / Treatment: Support, watchful waiting; Antidepressant or Psychotherapy  15-19= Moderately severe / Treatment: Antidepressant OR Psychotherapy  >= 20 = Major depression, severe / Antidepressant AND Psychotherapy   Cognitive Impairment Patient denies episodes of loosing things, being forgetful. Seems oriented to person, place and time.  Responses appear appropriate and timely  to this observer.  PREVENTION PLAN  Cardiovascular: FLP assessed LDL=110 Diabetes: A1c or FBG assessed A1c=5.7 Glaucoma: N/A Hepatitis B (HBV) Vaccine:  Not Applicable Smoking Cessation:  Not Applicable  Other Personalized Health Advice  Encouraged patient to exercise 5 days a week, walking, water aerobics, gentle stretching recommended. Increase dietary intake of fresh fruits and vegetables, reduce red meat to twice a week.  End of Life Counseling Patient has living will in place; POA - ; Full Code  Current Outpatient Medications  Medication Sig Dispense Refill   acetaminophen  (TYLENOL ) 500 MG tablet Take 500 mg by mouth as needed for Pain. 2 tablets     amLODIPine (NORVASC) 5 MG tablet Take 1 tablet (5 mg total) by mouth once daily 90 tablet 3   ascorbic acid, vitamin C, (VITAMIN C) 1000 MG tablet Take 1,000 mg by mouth once daily.     aspirin  81 MG EC tablet Take 81 mg by mouth once daily.     berberine chloride/herbs (BERBERINE-HERBAL DRUGS ORAL) Take by mouth once daily     carvediloL (COREG) 12.5 MG tablet Take 0.5 tablets (6.25 mg total) by mouth 2 (two) times daily with meals     cetirizine (ZYRTEC) 10 MG chewable tablet Take 10 mg by mouth at bedtime     cholecalciferol  (VITAMIN D3) 2,000 unit capsule Take 5,000 Units by mouth once daily     COLLAGEN MISC Use 1,000 mg once daily     cyanocobalamin (VITAMIN B12) 500 MCG tablet Take 1,000 mcg by mouth once daily     DULoxetine (CYMBALTA) 30 MG DR capsule TAKE 1 CAPSULE(30 MG) BY MOUTH DAILY 100 capsule 1  famotidine (PEPCID) 40 MG tablet Take 40 mg by mouth at bedtime as needed for Heartburn     gabapentin  (NEURONTIN ) 300 MG capsule Take 1 capsule (300 mg total) by mouth at bedtime 90 capsule 1   irbesartan (AVAPRO) 300 MG tablet TAKE 1 TABLET(300 MG) BY MOUTH DAILY 100 tablet 1   magnesium  glycinate 200 mg tablet Take 400 mg by mouth 2 (two) times daily at lunch and dinner     metaxalone (SKELAXIN) 800 mg  tablet Take 1 tablet (800 mg total) by mouth 3 (three) times daily as needed for Pain 30 tablet 5   metFORMIN (GLUCOPHAGE) 500 MG tablet Take 1 tablet (500 mg total) by mouth 2 (two) times daily with meals 60 tablet 11   omeprazole  (PRILOSEC) 40 MG DR capsule TAKE 1 CAPSULE(40 MG) BY MOUTH EVERY MORNING 90 capsule 1   sennosides-docusate (SENOKOT-S) 8.6-50 mg tablet Take 2 tablets by mouth 2 (two) times daily Take 1 tablet by mouth daily as needed for moderate constipation.        topiramate (TOPAMAX) 50 MG tablet Take 1 tablet (50 mg total) by mouth 2 (two) times daily 60 tablet 5   ubidecarenone/vitamin E mixed (COQ10 SG 100 ORAL) Take by mouth Take 1 tablet daily     ZINC  ORAL Take 22 mg by mouth Zinc  quercetin     albuterol  90 mcg/actuation inhaler Inhale 2 inhalations into the lungs every 6 (six) hours as needed for Wheezing 1 each 11   No current facility-administered medications for this visit.    Allergies as of 08/13/2024 - Reviewed 08/13/2024  Allergen Reaction Noted   Talwin [pentazocine lactate] Vomiting    Vicodin [hydrocodone-acetaminophen ] Vomiting    Erythromycin base Nausea 01/24/2014   Percocet [oxycodone -acetaminophen ] Itching 12/30/2014   Rofecoxib Nausea And Vomiting 10/29/2014    Patient Active Problem List  Diagnosis   HTN (hypertension)   Palpitations   Sarcoidosis   Hyperlipidemia   Sleep apnea   Elevated serum creatinine   Hx of adenomatous colonic polyps   Gastroesophageal reflux disease   Other dysphagia   Asthma without status asthmaticus (HHS-HCC)   CKD (chronic kidney disease) stage 3, GFR 30-59 ml/min (CMS-HCC)   Cervical spondylosis with radiculopathy   Family history of breast cancer in mother   History of total vaginal hysterectomy   Lumbar stenosis   Spondylolisthesis of lumbar region   Prediabetes   Obesity due to excess calories with serious comorbidity   Sarcoidosis of lung (HHS-HCC)    Past Medical  History:  Diagnosis Date   Abnormal cytology    Allergic rhinitis due to allergen    Chronic   Allergic state    Anxiety    Arthritis    Asthma without status asthmaticus (HHS-HCC)    Breast lump    cyst   Cataracts, bilateral    Chickenpox    Chronic kidney disease    Colon polyp    COPD (chronic obstructive pulmonary disease) (CMS/HHS-HCC)    Diverticulosis I   Elevated serum creatinine    Gastroesophageal reflux disease 09/01/2016   GERD (gastroesophageal reflux disease)    History of abnormal cervical Pap smear 1994   Office procedure, now negative.   History of pneumonia    1970s, 2004,?2023   Hx of adenomatous colonic polyps 09/01/2016   Hypertension    Lung disease    Neuropathy 1990's   Obesity    Child   Pleurisy    Sarcoidosis 1971   Stable  Seasonal allergies    Sinusitis, unspecified    Sleep apnea    Thyroid nodule    US  thyroid 06/2017 and 08/2018 showed stable rt-sided 2 cm nodule    Past Surgical History:  Procedure Laterality Date   BACK SURGERY Right 09/2001   Right hemilaminectomy   COLONOSCOPY  09/02/2011   Adenomatous Polyp, FH Colon Polyps (Father): [facility] 08/2016; Recall Ltr mailed 06/17/2016 (dw)   Left middle finger distal interphalangeal (DIP) arthrodesis  11/15/2012   ENDOSCOPIC CARPAL TUNNEL RELEASE Right 10/18/2013   ENDOSCOPIC CARPAL TUNNEL RELEASE Right 03/18/2014   COLONOSCOPY  06/21/2017   PH Adenomatous Polyp, FH Colon Polyps (Father): [facility] 06/2022   CARPAL TUNNEL RELEASE AND LEFT THUMB [facility] INJECTION (Left) Left 07/06/2017   Dr. Kathlynn   Colon @ [facility]  11/25/2022   Tubular adenoma/Hyperplastic polyps/PHx CP/Repeat 81yrs/SMR   Bilateral bunionectomy     BRONCHOSCOPY  2005   Unable to biopsy cough   CARPAL TUNNEL RELEASE  Right x2   CATARACT EXTRACTION     Cervical discectomy  1990s   with fusion   COLONOSCOPY  03/27/2006, 05/19/2000   FH Colon Polyps (Father)   EGD  03/27/2009,  03/27/2006, 01/24/1996, 09/22/1987   GASTRIC FUNDOPLICATION     HYSTERECTOMY     PYLOROPLASTY     SPINE SURGERY  2018,2016x2,1990's x2   3- lumbar, 2 cervical   TONSILLECTOMY     UPPER GASTROINTESTINAL ENDOSCOPY      Health Maintenance  Topic Date Due   Parathyroid Hormone  Never done   Hepatitis C Screen  Never done   COVID-19 Vaccine (1) Never done   Annual Urine Albumin Creatinine Ratio  Never done   Adult Tetanus (Td And Tdap)  Never done   RSV Immunization Pregnant or 50+ (1 - Risk 50-74 years 1-dose series) Never done   Pneumococcal Vaccine: 50+ (2 of 2 - PPSV23, PCV20, or PCV21) 04/25/2018   Serum Phosphorus  07/20/2021   Depression Screening  11/05/2022   Mammogram  07/30/2024   Medicare Subsequent AWV H9560  08/16/2024   Creatinine Level  08/06/2025   Potassium Level  08/06/2025   Lipid Panel  08/06/2025   Serum Bicarbonate  08/06/2025   Serum Calcium   08/06/2025   DXA Bone Density Scan  08/17/2025   Diabetes Screening  08/07/2027   Colorectal Cancer Screening  11/24/2029   Medicare Initial AWV H9561  Completed   Shingrix  Completed   Influenza Vaccine  Completed   Hib Vaccines  Aged Out   Hepatitis A Vaccines  Aged Out   Meningococcal B Vaccine  Aged Out   Meningococcal ACWY Vaccine  Aged Out   HPV Vaccines  Aged Out    Vitals:   08/13/24 1238  BP: 130/80  Pulse: 70  SpO2: 99%  Weight: 76.2 kg (168 lb)  PainSc: 0-No pain   Body mass index is 31.23 kg/m. General: Alert oriented x3  Eyes: Sclera and conjunctiva clear; pupils equal round and reactive to light and accommodation; extraocular movements intact Ears: External ears and canal normal; tympanic membranes normal.   Nose: Mucosa healthy without drainage or ulceration Oropharynx: No suspicious lesions Neck: No swelling, masses, stiffness, pain, limited movement, carotid pulses normal bilaterally, thyroid normal size, no masses palpated. No bruits heard. Lungs:  Respirations unlabored; clear to auscultation bilaterally Back: No spinal deformity Cardiovascular: Heart regular rate and rhythm without murmurs, gallops, or rubs Abdomen: Soft; non tender; non distended;  no masses or organomegaly Lymph Nodes: No significant  cervical, supraclavicular, or axillary lymphadenopathy noted Musculoskeletal: No active joint inflammation Extremities: Normal, no edema Pulses: Dorsalis pedis palpable and symmetric bilaterally Neurologic: Alert and oriented; speech intact; face symmetrical; moves all extremities well    Assessment/Plan  1. Medicare wellness visit- Medications and allergies reviewed. Copy of preventative health provided.  Labs reviewed. 2. CP- refer to Cardiology 3. Cervical DDD with radiculopathy- MRI ordered 4. HTN- stable, same meds 5. CKD- push fluids, labs 3 mo 6. Prediabetes- diet/exercise/water, labs 3 mo 7. RTC 3 mo, sooner if needed Reyes JONETTA Costa, MD, MD

## 2024-08-15 ENCOUNTER — Ambulatory Visit
Admission: RE | Admit: 2024-08-15 | Discharge: 2024-08-15 | Disposition: A | Discharge status: Home / Self Care | Source: Ambulatory Visit | Attending: Internal Medicine | Admitting: Internal Medicine

## 2024-08-15 DIAGNOSIS — M542 Cervicalgia: Secondary | ICD-10-CM | POA: Insufficient documentation

## 2024-10-04 ENCOUNTER — Encounter: Attending: Internal Medicine | Admitting: *Deleted

## 2024-10-04 DIAGNOSIS — J4540 Moderate persistent asthma, uncomplicated: Secondary | ICD-10-CM

## 2024-10-04 DIAGNOSIS — G4733 Obstructive sleep apnea (adult) (pediatric): Secondary | ICD-10-CM

## 2024-10-04 NOTE — Progress Notes (Signed)
 Initial phone call completed. Diagnosis can be found in [facility] 1/12. EP Orientation scheduled for Wednesday 2/4 at 1:45.

## 2024-10-09 ENCOUNTER — Encounter

## 2024-10-09 VITALS — Ht 61.6 in | Wt 167.6 lb

## 2024-10-09 DIAGNOSIS — G4733 Obstructive sleep apnea (adult) (pediatric): Secondary | ICD-10-CM

## 2024-10-09 DIAGNOSIS — J4540 Moderate persistent asthma, uncomplicated: Secondary | ICD-10-CM

## 2024-10-09 NOTE — Patient Instructions (Signed)
 Patient Instructions  Patient Details  Name: Megan Daniels MRN: [id number] Date of Birth: July 22, 1951 Referring Provider:  Custovic, Sabina, DO  Below are your personal goals for exercise, nutrition, and risk factors. Our goal is to help you stay on track towards obtaining and maintaining these goals. We will be discussing your progress on these goals with you throughout the program.  Initial Exercise Prescription:  Initial Exercise Prescription - 10/09/24 1500       Date of Initial Exercise RX and Referring Provider   Date 10/09/24    Referring Provider Dr. Sabina Custovic      Oxygen   Maintain Oxygen Saturation 88% or higher      Treadmill   MPH 2    Grade 0    Minutes 15    METs 2.53      Recumbant Bike   Level 2    RPM 50    Watts 25    Minutes 15    METs 2      NuStep   Level 2    SPM 80    Minutes 15    METs 2      Biostep-RELP   Level 2    SPM 50    Minutes 15    METs 2      Prescription Details   Duration Progress to 30 minutes of continuous aerobic without signs/symptoms of physical distress      Intensity   THRR 40-80% of Max Heartrate 104-132    Ratings of Perceived Exertion 11-13    Perceived Dyspnea 0-4      Progression   Progression Continue to progress workloads to maintain intensity without signs/symptoms of physical distress.      Resistance Training   Training Prescription Yes    Weight 2lb    Reps 10-15          Exercise Goals: Frequency: Be able to perform aerobic exercise two to three times per week in program working toward 2-5 days per week of home exercise.  Intensity: Work with a perceived exertion of 11 (fairly light) - 15 (hard) while following your exercise prescription.  We will make changes to your prescription with you as you progress through the program.   Duration: Be able to do 30 to 45 minutes of continuous aerobic exercise in addition to a 5 minute warm-up and a 5 minute cool-down routine.   Nutrition  Goals: Your personal nutrition goals will be established when you do your nutrition analysis with the dietician.  The following are general nutrition guidelines to follow: Cholesterol < 200mg /day Sodium < 1500mg /day Fiber: Women over 50 yrs - 21 grams per day  Personal Goals:  Personal Goals and Risk Factors at Admission - 10/04/24 1343       Core Components/Risk Factors/Patient Goals on Admission    Weight Management Weight Loss;Yes    Intervention Weight Management: Develop a combined nutrition and exercise program designed to reach desired caloric intake, while maintaining appropriate intake of nutrient and fiber, sodium and fats, and appropriate energy expenditure required for the weight goal.;Weight Management: Provide education and appropriate resources to help participant work on and attain dietary goals.;Weight Management/Obesity: Establish reasonable short term and long term weight goals.    Goal Weight: Long Term 160 lb (72.6 kg)    Expected Outcomes Short Term: Continue to assess and modify interventions until short term weight is achieved;Long Term: Adherence to nutrition and physical activity/exercise program aimed toward attainment of established weight goal;Weight Loss:  Understanding of general recommendations for a balanced deficit meal plan, which promotes 1-2 lb weight loss per week and includes a negative energy balance of [id number] kcal/d;Understanding recommendations for meals to include 15-35% energy as protein, 25-35% energy from fat, 35-60% energy from carbohydrates, less than 200mg  of dietary cholesterol, 20-35 gm of total fiber daily;Understanding of distribution of calorie intake throughout the day with the consumption of 4-5 meals/snacks    Hypertension Yes    Intervention Provide education on lifestyle modifcations including regular physical activity/exercise, weight management, moderate sodium restriction and increased consumption of fresh fruit, vegetables, and low fat  dairy, alcohol moderation, and smoking cessation.;Monitor prescription use compliance.    Expected Outcomes Short Term: Continued assessment and intervention until BP is < 140/7mm HG in hypertensive participants. < 130/44mm HG in hypertensive participants with diabetes, heart failure or chronic kidney disease.;Long Term: Maintenance of blood pressure at goal levels.    Lipids Yes    Intervention Provide education and support for participant on nutrition & aerobic/resistive exercise along with prescribed medications to achieve LDL 70mg , HDL >40mg .    Expected Outcomes Short Term: Participant states understanding of desired cholesterol values and is compliant with medications prescribed. Participant is following exercise prescription and nutrition guidelines.;Long Term: Cholesterol controlled with medications as prescribed, with individualized exercise RX and with personalized nutrition plan. Value goals: LDL < 70mg , HDL > 40 mg.          Exercise Goals and Review:  Exercise Goals     Row Name 10/09/24 1536             Exercise Goals   Increase Physical Activity Yes       Intervention Provide advice, education, support and counseling about physical activity/exercise needs.;Develop an individualized exercise prescription for aerobic and resistive training based on initial evaluation findings, risk stratification, comorbidities and participant's personal goals.       Expected Outcomes Short Term: Attend rehab on a regular basis to increase amount of physical activity.;Long Term: Add in home exercise to make exercise part of routine and to increase amount of physical activity.;Long Term: Exercising regularly at least 3-5 days a week.       Increase Strength and Stamina Yes       Intervention Provide advice, education, support and counseling about physical activity/exercise needs.;Develop an individualized exercise prescription for aerobic and resistive training based on initial evaluation  findings, risk stratification, comorbidities and participant's personal goals.       Expected Outcomes Short Term: Increase workloads from initial exercise prescription for resistance, speed, and METs.;Short Term: Perform resistance training exercises routinely during rehab and add in resistance training at home;Long Term: Improve cardiorespiratory fitness, muscular endurance and strength as measured by increased METs and functional capacity ( )       Able to understand and use rate of perceived exertion (RPE) scale Yes       Intervention Provide education and explanation on how to use RPE scale       Expected Outcomes Short Term: Able to use RPE daily in rehab to express subjective intensity level;Long Term:  Able to use RPE to guide intensity level when exercising independently       Able to understand and use Dyspnea scale Yes       Intervention Provide education and explanation on how to use Dyspnea scale       Expected Outcomes Short Term: Able to use Dyspnea scale daily in rehab to express subjective sense of shortness of breath during  exertion;Long Term: Able to use Dyspnea scale to guide intensity level when exercising independently       Knowledge and understanding of Target Heart Rate Range (THRR) Yes       Intervention Provide education and explanation of THRR including how the numbers were predicted and where they are located for reference       Expected Outcomes Short Term: Able to state/look up THRR;Long Term: Able to use THRR to govern intensity when exercising independently;Short Term: Able to use daily as guideline for intensity in rehab       Able to check pulse independently Yes       Intervention Provide education and demonstration on how to check pulse in carotid and radial arteries.;Review the importance of being able to check your own pulse for safety during independent exercise       Expected Outcomes Short Term: Able to explain why pulse checking is important during  independent exercise;Long Term: Able to check pulse independently and accurately       Understanding of Exercise Prescription Yes       Intervention Provide education, explanation, and written materials on patient's individual exercise prescription       Expected Outcomes Short Term: Able to explain program exercise prescription;Long Term: Able to explain home exercise prescription to exercise independently          Copy of goals given to participant.

## 2024-10-09 NOTE — Progress Notes (Signed)
 Pulmonary Individual Treatment Plan  Patient Details  Name: Megan Daniels MRN: [id number] Date of Birth: [birth year] Referring Provider:   Flowsheet Row Pulmonary Rehab from 10/09/2024 in [facility] Cardiac and Pulmonary Rehab  Referring Provider Dr. Annalee Casa    Initial Encounter Date:  Flowsheet Row Pulmonary Rehab from 10/09/2024 in [facility] Cardiac and Pulmonary Rehab  Date 10/09/24    Visit Diagnosis: Moderate persistent asthma without status asthmaticus without complication  Obstructive sleep apnea  Patient's Home Medications on Admission: Current Medications[1]  Past Medical History: Past Medical History:  Diagnosis Date   Abnormal Pap smear of cervix    pos hpv-    Acid reflux    Anxiety    Arthritis    Asthma    Symbicort as needed   Cancer (HCC)    Basal Cell Skin Cancer   Chalazion of left eye    Chronic back pain    spondylolisthesis/stenosis   Chronic UTI    was treated for 3 months with Macrobid and in Dec everything was fine   Constipation    COPD (chronic obstructive pulmonary disease) (HCC)    Diverticulosis    Elevated serum creatinine    with nsaids   Foot drop    right   Genital warts    h/o   GERD (gastroesophageal reflux disease)    takes Prevacid daily   Headache    History of bronchitis    History of colon polyps    History of hiatal hernia    Hx of vertigo    Hypertension    takes Losartan  daily   IBS (irritable bowel syndrome)    Joint pain    Palpitations    takes Metoprolol  daily   Pneumonia    hx of-early 2000's   PONV (postoperative nausea and vomiting)    extreme nausea and vomiting -none last surgery 10/29/14   Sarcoidosis    Seasonal allergies    takes Claritin  daily   Shortness of breath dyspnea    Sleep apnea    cpap 10 yrs   Stress incontinence    Thyroid nodule    URI (upper respiratory infection)    treated self with Mucinex and no fever   Urinary urgency    Vitamin D  deficiency    Weakness    numbness and  tingling in both legs    Tobacco Use: Tobacco Use History[2]  Labs: Review Flowsheet        No data to display           Pulmonary Assessment Scores:  Pulmonary Assessment Scores     Row Name 10/09/24 1538         ADL UCSD   ADL Phase Entry     SOB Score total 37     Rest 0     Walk 0     Stairs 3     Bath 0     Dress 1     Shop 1       CAT Score   CAT Score 20       mMRC Score   mMRC Score 2        UCSD: Self-administered rating of dyspnea associated with activities of daily living (ADLs) 6-point scale (0 = not at all to 5 = maximal or unable to do because of breathlessness)  Scoring Scores range from 0 to 120.  Minimally important difference is 5 units  CAT: CAT can identify the health impairment of COPD patients and is  better correlated with disease progression.  CAT has a scoring range of zero to 40. The CAT score is classified into four groups of low (less than 10), medium (10 - 20), high (21-30) and very high (31-40) based on the impact level of disease on health status. A CAT score over 10 suggests significant symptoms.  A worsening CAT score could be explained by an exacerbation, poor medication adherence, poor inhaler technique, or progression of COPD or comorbid conditions.  CAT MCID is 2 points  mMRC: mMRC (Modified Medical Research Council) Dyspnea Scale is used to assess the degree of baseline functional disability in patients of respiratory disease due to dyspnea. No minimal important difference is established. A decrease in score of 1 point or greater is considered a positive change.   Pulmonary Function Assessment:   Exercise Target Goals: Exercise Program Goal: Individual exercise prescription set using results from initial 6 min walk test and THRR while considering  patients activity barriers and safety.   Exercise Prescription Goal: Initial exercise prescription builds to 30-45 minutes a day of aerobic activity, 2-3 days per week.   Home exercise guidelines will be given to patient during program as part of exercise prescription that the participant will acknowledge.  Education: Aerobic Exercise: - Group verbal and visual presentation on the components of exercise prescription. Introduces F.I.T.T principle from ACSM for exercise prescriptions.  Reviews F.I.T.T. principles of aerobic exercise including progression. Written material provided at class time.   Education: Resistance Exercise: - Group verbal and visual presentation on the components of exercise prescription. Introduces F.I.T.T principle from ACSM for exercise prescriptions  Reviews F.I.T.T. principles of resistance exercise including progression. Written material provided at class time.    Education: Exercise & Equipment Safety: - Individual verbal instruction and demonstration of equipment use and safety with use of the equipment. Flowsheet Row Pulmonary Rehab from 10/09/2024 in [facility] Cardiac and Pulmonary Rehab  Date 10/09/24  Educator [facility]  Instruction Review Code 1- Verbalizes Understanding    Education: Exercise Physiology & General Exercise Guidelines: - Group verbal and written instruction with models to review the exercise physiology of the cardiovascular system and associated critical values. Provides general exercise guidelines with specific guidelines to those with heart or lung disease.    Education: Flexibility, Balance, Mind/Body Relaxation: - Group verbal and visual presentation with interactive activity on the components of exercise prescription. Introduces F.I.T.T principle from ACSM for exercise prescriptions. Reviews F.I.T.T. principles of flexibility and balance exercise training including progression. Also discusses the mind body connection.  Reviews various relaxation techniques to help reduce and manage stress (i.e. Deep breathing, progressive muscle relaxation, and visualization). Balance handout provided to take home. Written material  provided at class time.   Activity Barriers & Risk Stratification:  Activity Barriers & Cardiac Risk Stratification - 10/09/24 1526       Activity Barriers & Cardiac Risk Stratification   Activity Barriers Back Problems;Neck/Spine Problems;Shortness of Breath          6 Minute Walk:  6 Minute Walk     Row Name 10/09/24 1521         6 Minute Walk   Phase Initial     Distance 1060 feet     Walk Time 6 minutes     # of Rest Breaks 0     MPH 2     METS 2.06     RPE 15     Perceived Dyspnea  3     VO2 Peak 7.2  Symptoms Yes (comment)     Comments Left hip pain 4/10     Resting HR 77 bpm     Resting BP 114/62     Resting Oxygen Saturation  96 %     Exercise Oxygen Saturation  during 6 min walk 93 %     Max Ex. HR 100 bpm     Max Ex. BP 142/68     2 Minute Post BP 124/60       Interval HR   1 Minute HR 87     2 Minute HR 95     3 Minute HR 96     4 Minute HR 100     5 Minute HR 100     6 Minute HR 96     2 Minute Post HR 76     Interval Heart Rate? Yes       Interval Oxygen   Interval Oxygen? Yes     Baseline Oxygen Saturation % 96 %     1 Minute Oxygen Saturation % 95 %     1 Minute Liters of Oxygen 0 L     2 Minute Oxygen Saturation % 96 %     2 Minute Liters of Oxygen 0 L     3 Minute Oxygen Saturation % 95 %     3 Minute Liters of Oxygen 0 L     4 Minute Oxygen Saturation % 93 %     4 Minute Liters of Oxygen 0 L     5 Minute Oxygen Saturation % 94 %     5 Minute Liters of Oxygen 0 L     6 Minute Oxygen Saturation % 96 %     6 Minute Liters of Oxygen 0 L     2 Minute Post Oxygen Saturation % 98 %     2 Minute Post Liters of Oxygen 0 L       Oxygen Initial Assessment:  Oxygen Initial Assessment - 10/09/24 1537       Home Oxygen   Home Oxygen Device None    Sleep Oxygen Prescription CPAP    Liters per minute 0    Home Exercise Oxygen Prescription None    Home Resting Oxygen Prescription None    Compliance with Home Oxygen Use Yes       Initial 6 min Walk   Oxygen Used None      Program Oxygen Prescription   Program Oxygen Prescription None      Intervention   Short Term Goals To learn and exhibit compliance with exercise, home and travel O2 prescription;To learn and understand importance of monitoring SPO2 with pulse oximeter and demonstrate accurate use of the pulse oximeter.;To learn and understand importance of maintaining oxygen saturations>88%;To learn and demonstrate proper pursed lip breathing techniques or other breathing techniques. ;To learn and demonstrate proper use of respiratory medications    Long  Term Goals Exhibits compliance with exercise, home  and travel O2 prescription;Verbalizes importance of monitoring SPO2 with pulse oximeter and return demonstration;Maintenance of O2 saturations>88%;Exhibits proper breathing techniques, such as pursed lip breathing or other method taught during program session;Compliance with respiratory medication;Demonstrates proper use of MDIs          Oxygen Re-Evaluation:   Oxygen Discharge (Final Oxygen Re-Evaluation):   Initial Exercise Prescription:  Initial Exercise Prescription - 10/09/24 1500       Date of Initial Exercise RX and Referring Provider   Date 10/09/24    Referring Provider  Dr. Sabina Custovic      Oxygen   Maintain Oxygen Saturation 88% or higher      Treadmill   MPH 2    Grade 0    Minutes 15    METs 2.53      Recumbant Bike   Level 2    RPM 50    Watts 25    Minutes 15    METs 2      NuStep   Level 2    SPM 80    Minutes 15    METs 2      Biostep-RELP   Level 2    SPM 50    Minutes 15    METs 2      Prescription Details   Duration Progress to 30 minutes of continuous aerobic without signs/symptoms of physical distress      Intensity   THRR 40-80% of Max Heartrate 104-132    Ratings of Perceived Exertion 11-13    Perceived Dyspnea 0-4      Progression   Progression Continue to progress workloads to maintain  intensity without signs/symptoms of physical distress.      Resistance Training   Training Prescription Yes    Weight 2lb    Reps 10-15          Perform Capillary Blood Glucose checks as needed.  Exercise Prescription Changes:   Exercise Prescription Changes     Row Name 10/09/24 1500             Response to Exercise   Blood Pressure (Admit) 114/62       Blood Pressure (Exercise) 142/68       Blood Pressure (Exit) 124/60       Heart Rate (Admit) 77 bpm       Heart Rate (Exercise) 100 bpm       Heart Rate (Exit) 72 bpm       Oxygen Saturation (Admit) 96 %       Oxygen Saturation (Exercise) 93 %       Oxygen Saturation (Exit) 96 %       Rating of Perceived Exertion (Exercise) 15       Perceived Dyspnea (Exercise) 3       Symptoms left hip pain 4/10       Comments results          Exercise Comments:   Exercise Goals and Review:   Exercise Goals     Row Name 10/09/24 1536             Exercise Goals   Increase Physical Activity Yes       Intervention Provide advice, education, support and counseling about physical activity/exercise needs.;Develop an individualized exercise prescription for aerobic and resistive training based on initial evaluation findings, risk stratification, comorbidities and participant's personal goals.       Expected Outcomes Short Term: Attend rehab on a regular basis to increase amount of physical activity.;Long Term: Add in home exercise to make exercise part of routine and to increase amount of physical activity.;Long Term: Exercising regularly at least 3-5 days a week.       Increase Strength and Stamina Yes       Intervention Provide advice, education, support and counseling about physical activity/exercise needs.;Develop an individualized exercise prescription for aerobic and resistive training based on initial evaluation findings, risk stratification, comorbidities and participant's personal goals.       Expected Outcomes Short  Term: Increase workloads from initial exercise  prescription for resistance, speed, and METs.;Short Term: Perform resistance training exercises routinely during rehab and add in resistance training at home;Long Term: Improve cardiorespiratory fitness, muscular endurance and strength as measured by increased METs and functional capacity ( )       Able to understand and use rate of perceived exertion (RPE) scale Yes       Intervention Provide education and explanation on how to use RPE scale       Expected Outcomes Short Term: Able to use RPE daily in rehab to express subjective intensity level;Long Term:  Able to use RPE to guide intensity level when exercising independently       Able to understand and use Dyspnea scale Yes       Intervention Provide education and explanation on how to use Dyspnea scale       Expected Outcomes Short Term: Able to use Dyspnea scale daily in rehab to express subjective sense of shortness of breath during exertion;Long Term: Able to use Dyspnea scale to guide intensity level when exercising independently       Knowledge and understanding of Target Heart Rate Range (THRR) Yes       Intervention Provide education and explanation of THRR including how the numbers were predicted and where they are located for reference       Expected Outcomes Short Term: Able to state/look up THRR;Long Term: Able to use THRR to govern intensity when exercising independently;Short Term: Able to use daily as guideline for intensity in rehab       Able to check pulse independently Yes       Intervention Provide education and demonstration on how to check pulse in carotid and radial arteries.;Review the importance of being able to check your own pulse for safety during independent exercise       Expected Outcomes Short Term: Able to explain why pulse checking is important during independent exercise;Long Term: Able to check pulse independently and accurately       Understanding of Exercise  Prescription Yes       Intervention Provide education, explanation, and written materials on patient's individual exercise prescription       Expected Outcomes Short Term: Able to explain program exercise prescription;Long Term: Able to explain home exercise prescription to exercise independently          Exercise Goals Re-Evaluation :   Discharge Exercise Prescription (Final Exercise Prescription Changes):  Exercise Prescription Changes - 10/09/24 1500       Response to Exercise   Blood Pressure (Admit) 114/62    Blood Pressure (Exercise) 142/68    Blood Pressure (Exit) 124/60    Heart Rate (Admit) 77 bpm    Heart Rate (Exercise) 100 bpm    Heart Rate (Exit) 72 bpm    Oxygen Saturation (Admit) 96 %    Oxygen Saturation (Exercise) 93 %    Oxygen Saturation (Exit) 96 %    Rating of Perceived Exertion (Exercise) 15    Perceived Dyspnea (Exercise) 3    Symptoms left hip pain 4/10    Comments results          Nutrition:  Target Goals: Understanding of nutrition guidelines, daily intake of sodium 1500mg , cholesterol 200mg , calories 30% from fat and 7% or less from saturated fats, daily to have 5 or more servings of fruits and vegetables.  Education: Nutrition 1 -Group instruction provided by verbal, written material, interactive activities, discussions, models, and posters to present general guidelines for heart healthy nutrition including  macronutrients, label reading, and promoting whole foods over processed counterparts. Education serves as pensions consultant of discussion of heart healthy eating for all. Written material provided at class time.     Education: Nutrition 2 -Group instruction provided by verbal, written material, interactive activities, discussions, models, and posters to present general guidelines for heart healthy nutrition including sodium, cholesterol, and saturated fat. Providing guidance of habit forming to improve blood pressure, cholesterol, and body  weight. Written material provided at class time.     Biometrics:  Pre Biometrics - 10/09/24 1536       Pre Biometrics   Height 5' 1.6 (1.565 m)    Weight 167 lb 9.6 oz (76 kg)    Waist Circumference 38 inches    Hip Circumference 40 inches    Waist to Hip Ratio 0.95 %    BMI (Calculated) 31.04    Single Leg Stand 12.85 seconds           Nutrition Therapy Plan and Nutrition Goals:   Nutrition Assessments:  MEDIFICTS Score Key: >=70 Need to make dietary changes  40-70 Heart Healthy Diet <= 40 Therapeutic Level Cholesterol Diet  Flowsheet Row Pulmonary Rehab from 10/09/2024 in [facility] Cardiac and Pulmonary Rehab  Picture Your Plate Total Score on Admission 58   Picture Your Plate Scores: <59 Unhealthy dietary pattern with much room for improvement. 41-50 Dietary pattern unlikely to meet recommendations for good health and room for improvement. 51-60 More healthful dietary pattern, with some room for improvement.  >60 Healthy dietary pattern, although there may be some specific behaviors that could be improved.   Nutrition Goals Re-Evaluation:   Nutrition Goals Discharge (Final Nutrition Goals Re-Evaluation):   Psychosocial: Target Goals: Acknowledge presence or absence of significant depression and/or stress, maximize coping skills, provide positive support system. Participant is able to verbalize types and ability to use techniques and skills needed for reducing stress and depression.   Education: Stress, Anxiety, and Depression - Group verbal and visual presentation to define topics covered.  Reviews how body is impacted by stress, anxiety, and depression.  Also discusses healthy ways to reduce stress and to treat/manage anxiety and depression.  Written material provided at class time.   Education: Sleep Hygiene -Provides group verbal and written instruction about how sleep can affect your health.  Define sleep hygiene, discuss sleep cycles and impact of sleep  habits. Review good sleep hygiene tips.    Initial Review & Psychosocial Screening:  Initial Psych Review & Screening - 10/04/24 1337       Initial Review   Current issues with Current Stress Concerns    Source of Stress Concerns Family      Family Dynamics   Good Support System? Yes      Barriers   Psychosocial barriers to participate in program There are no identifiable barriers or psychosocial needs.;The patient should benefit from training in stress management and relaxation.      Screening Interventions   Interventions Encouraged to exercise;To provide support and resources with identified psychosocial needs;Provide feedback about the scores to participant    Expected Outcomes Short Term goal: Utilizing psychosocial counselor, staff and physician to assist with identification of specific Stressors or current issues interfering with healing process. Setting desired goal for each stressor or current issue identified.;Long Term Goal: Stressors or current issues are controlled or eliminated.;Short Term goal: Identification and review with participant of any Quality of Life or Depression concerns found by scoring the questionnaire.;Long Term goal: The participant improves quality of  Life and PHQ9 Scores as seen by post scores and/or verbalization of changes          Quality of Life Scores:  Scores of 19 and below usually indicate a poorer quality of life in these areas.  A difference of  2-3 points is a clinically meaningful difference.  A difference of 2-3 points in the total score of the Quality of Life Index has been associated with significant improvement in overall quality of life, self-image, physical symptoms, and general health in studies assessing change in quality of life.  PHQ-9: Review Flowsheet       10/09/2024 10/10/2023 06/28/2023  Depression screen PHQ 2/9  Decreased Interest 0 0 0  Down, Depressed, Hopeless 0 0 0  PHQ - 2 Score 0 0 0  Altered sleeping 0 0 0  Tired,  decreased energy 2 1 1   Change in appetite 2 2 1   Feeling bad or failure about yourself  0 0 1  Trouble concentrating 0 0 0  Moving slowly or fidgety/restless 0 0 0  Suicidal thoughts 0 0 0  PHQ-9 Score 4 3  3    Difficult doing work/chores Not difficult at all Not difficult at all -    Details       Data saved with a previous flowsheet row definition        Interpretation of Total Score  Total Score Depression Severity:  1-4 = Minimal depression, 5-9 = Mild depression, 10-14 = Moderate depression, 15-19 = Moderately severe depression, 20-27 = Severe depression   Psychosocial Evaluation and Intervention:  Psychosocial Evaluation - 10/04/24 1356       Psychosocial Evaluation & Interventions   Interventions Stress management education;Encouraged to exercise with the program and follow exercise prescription;Relaxation education    Comments Ms. Insco is coming to pulmonary rehab. She states when she graduated from the program the last time in 2024, she continued to do very well until her husband was in a bad car accident. This event added extra stress on top of her husband's dementia. She is grateful he has recovered from the wreck and is ready to focus on her own health. She states his dementia and health is a stressor, but she focuses on each day at a time and can rely on family for help. She enjoys playing cards weekly with her sister and spending time with family.    Expected Outcomes Short: attend pulmonary rehab for education and exercise Long: Develop and maintain positive self care habits    Continue Psychosocial Services  Follow up required by staff          Psychosocial Re-Evaluation:   Psychosocial Discharge (Final Psychosocial Re-Evaluation):   Education: Education Goals: Education classes will be provided on a weekly basis, covering required topics. Participant will state understanding/return demonstration of topics presented.  Learning Barriers/Preferences:   Learning Barriers/Preferences - 10/04/24 1345       Learning Barriers/Preferences   Learning Barriers Hearing    Learning Preferences None          General Pulmonary Education Topics:  Infection Prevention: - Provides verbal and written material to individual with discussion of infection control including proper hand washing and proper equipment cleaning during exercise session. Flowsheet Row Pulmonary Rehab from 10/09/2024 in [facility] Cardiac and Pulmonary Rehab  Date 10/09/24  Educator [facility] [place]  Instruction Review Code 1- Verbalizes Understanding    Falls Prevention: - Provides verbal and written material to individual with discussion of falls prevention and safety. Flowsheet Row Pulmonary  Rehab from 10/09/2024 in [facility] Cardiac and Pulmonary Rehab  Date 10/09/24  Educator [facility]  Instruction Review Code 1- Verbalizes Understanding    Chronic Lung Disease Review: - Group verbal instruction with posters, models, PowerPoint presentations and videos,  to review new updates, new respiratory medications, new advancements in procedures and treatments. Providing information on websites and 800 numbers for continued self-education. Includes information about supplement oxygen, available portable oxygen systems, continuous and intermittent flow rates, oxygen safety, concentrators, and Medicare reimbursement for oxygen. Explanation of Pulmonary Drugs, including class, frequency, complications, importance of spacers, rinsing mouth after steroid MDI's, and proper cleaning methods for nebulizers. Review of basic lung anatomy and physiology related to function, structure, and complications of lung disease. Review of risk factors. Discussion about methods for diagnosing sleep apnea and types of masks and machines for OSA. Includes a review of the use of types of environmental controls: home humidity, furnaces, filters, dust mite/pet prevention, HEPA vacuums. Discussion about weather changes, air quality and the  benefits of nasal washing. Instruction on Warning signs, infection symptoms, calling MD promptly, preventive modes, and value of vaccinations. Review of effective airway clearance, coughing and/or vibration techniques. Emphasizing that all should Create an Action Plan. Written material provided at class time. Flowsheet Row Pulmonary Rehab from 10/09/2024 in [facility] Cardiac and Pulmonary Rehab  Education need identified 10/09/24    AED/CPR: - Group verbal and written instruction with the use of models to demonstrate the basic use of the AED with the basic ABC's of resuscitation.    Tests and Procedures:  - Group verbal and visual presentation and models provide information about basic cardiac anatomy and function. Reviews the testing methods done to diagnose heart disease and the outcomes of the test results. Describes the treatment choices: Medical Management, Angioplasty, or Coronary Bypass Surgery for treating various heart conditions including Myocardial Infarction, Angina, Valve Disease, and Cardiac Arrhythmias.  Written material provided at class time.   Medication Safety: - Group verbal and visual instruction to review commonly prescribed medications for heart and lung disease. Reviews the medication, class of the drug, and side effects. Includes the steps to properly store meds and maintain the prescription regimen.  Written material given at graduation.   Other: -Provides group and verbal instruction on various topics (see comments)   Knowledge Questionnaire Score:  Knowledge Questionnaire Score - 10/09/24 1542       Knowledge Questionnaire Score   Pre Score 15/18           Core Components/Risk Factors/Patient Goals at Admission:  Personal Goals and Risk Factors at Admission - 10/04/24 1343       Core Components/Risk Factors/Patient Goals on Admission    Weight Management Weight Loss;Yes    Intervention Weight Management: Develop a combined nutrition and exercise program  designed to reach desired caloric intake, while maintaining appropriate intake of nutrient and fiber, sodium and fats, and appropriate energy expenditure required for the weight goal.;Weight Management: Provide education and appropriate resources to help participant work on and attain dietary goals.;Weight Management/Obesity: Establish reasonable short term and long term weight goals.    Goal Weight: Long Term 160 lb (72.6 kg)    Expected Outcomes Short Term: Continue to assess and modify interventions until short term weight is achieved;Long Term: Adherence to nutrition and physical activity/exercise program aimed toward attainment of established weight goal;Weight Loss: Understanding of general recommendations for a balanced deficit meal plan, which promotes 1-2 lb weight loss per week and includes a negative energy balance of  [phone] kcal/d;Understanding recommendations for meals to include 15-35% energy as protein, 25-35% energy from fat, 35-60% energy from carbohydrates, less than 200mg  of dietary cholesterol, 20-35 gm of total fiber daily;Understanding of distribution of calorie intake throughout the day with the consumption of 4-5 meals/snacks    Hypertension Yes    Intervention Provide education on lifestyle modifcations including regular physical activity/exercise, weight management, moderate sodium restriction and increased consumption of fresh fruit, vegetables, and low fat dairy, alcohol moderation, and smoking cessation.;Monitor prescription use compliance.    Expected Outcomes Short Term: Continued assessment and intervention until BP is < 140/45mm HG in hypertensive participants. < 130/43mm HG in hypertensive participants with diabetes, heart failure or chronic kidney disease.;Long Term: Maintenance of blood pressure at goal levels.    Lipids Yes    Intervention Provide education and support for participant on nutrition & aerobic/resistive exercise along with prescribed medications to achieve  LDL 70mg , HDL >40mg .    Expected Outcomes Short Term: Participant states understanding of desired cholesterol values and is compliant with medications prescribed. Participant is following exercise prescription and nutrition guidelines.;Long Term: Cholesterol controlled with medications as prescribed, with individualized exercise RX and with personalized nutrition plan. Value goals: LDL < 70mg , HDL > 40 mg.          Education:Diabetes - Individual verbal and written instruction to review signs/symptoms of diabetes, desired ranges of glucose level fasting, after meals and with exercise. Acknowledge that pre and post exercise glucose checks will be done for 3 sessions at entry of program.   Know Your Numbers and Heart Failure: - Group verbal and visual instruction to discuss disease risk factors for cardiac and pulmonary disease and treatment options.  Reviews associated critical values for Overweight/Obesity, Hypertension, Cholesterol, and Diabetes.  Discusses basics of heart failure: signs/symptoms and treatments.  Introduces Heart Failure Zone chart for action plan for heart failure. Written material provided at class time.   Core Components/Risk Factors/Patient Goals Review:    Core Components/Risk Factors/Patient Goals at Discharge (Final Review):    ITP Comments:  ITP Comments     Row Name 10/04/24 1402 10/09/24 1521         ITP Comments Initial phone call completed. Diagnosis can be found in [facility] 1/12. EP Orientation scheduled for Wednesday 2/4 at 1:45. Completed and gym orientation for pulmonary rehab. Initial ITP created and sent for review to Dr. Fuad Aleskerov, Medical Director.         Comments: Initial ITP     [1]  Current Outpatient Medications:    acetaminophen  (TYLENOL ) 500 MG tablet, Take 1,000 mg by mouth every 6 (six) hours as needed for moderate pain or headache., Disp: , Rfl:    albuterol  (VENTOLIN  HFA) 108 (90 Base) MCG/ACT inhaler, Inhale 2 puffs into  the lungs every 4 (four) hours as needed for wheezing or shortness of breath.  (Patient not taking: Reported on 11/25/2022), Disp: , Rfl:    albuterol  (VENTOLIN  HFA) 108 (90 Base) MCG/ACT inhaler, Inhale into the lungs., Disp: , Rfl:    amLODipine (NORVASC) 5 MG tablet, Take 5 mg by mouth daily., Disp: , Rfl:    aspirin  EC 81 MG tablet, Take 1 tablet (81 mg total) by mouth daily. (Patient taking differently: Take 81 mg by mouth every evening.), Disp: , Rfl:    Black Elderberry (SAMBUCUS ELDERBERRY) 50 MG/5ML SYRP, Take by mouth., Disp: , Rfl:    busPIRone (BUSPAR) 10 MG tablet, Take 10 mg by mouth 2 (two) times daily. (Patient  not taking: Reported on 06/21/2023), Disp: , Rfl:    carvedilol (COREG) 12.5 MG tablet, Take 12.5 mg by mouth., Disp: , Rfl:    carvedilol (COREG) 12.5 MG tablet, Take by mouth., Disp: , Rfl:    cephALEXin (KEFLEX) 500 MG capsule, Take 500 mg by mouth 3 (three) times daily. (Patient not taking: Reported on 06/21/2023), Disp: , Rfl:    cetirizine (ZYRTEC) 10 MG chewable tablet, Chew by mouth., Disp: , Rfl:    cetirizine (ZYRTEC) 10 MG tablet, Take by mouth. (Patient not taking: Reported on 06/21/2023), Disp: , Rfl:    Cholecalciferol  (VITAMIN D ) 2000 units CAPS, Take 2,000 Units by mouth daily. , Disp: , Rfl:    Cholecalciferol  (VITAMIN D3) 50 MCG (2000 UT) capsule, Take by mouth., Disp: , Rfl:    Collagen Matrix, Porcine, 2 X 2 X 2 CM MISC, Use 1,000 mg once daily, Disp: , Rfl:    cyanocobalamin (VITAMIN B12) 500 MCG tablet, Take by mouth., Disp: , Rfl:    docusate sodium  (COLACE) 100 MG capsule, Take 100 mg by mouth 2 (two) times daily. , Disp: , Rfl:    DULoxetine (CYMBALTA) 30 MG capsule, Take by mouth., Disp: , Rfl:    famotidine (PEPCID) 40 MG tablet, Take by mouth., Disp: , Rfl:    fluticasone (FLONASE) 50 MCG/ACT nasal spray, Place into the nose., Disp: , Rfl:    gabapentin  (NEURONTIN ) 300 MG capsule, Take by mouth. (Patient not taking: Reported on 06/21/2023), Disp: ,  Rfl:    gabapentin  (NEURONTIN ) 300 MG capsule, Take by mouth., Disp: , Rfl:    irbesartan (AVAPRO) 300 MG tablet, TAKE 1 TABLET BY MOUTH DAILY, Disp: , Rfl:    irbesartan (AVAPRO) 300 MG tablet, Take by mouth. (Patient not taking: Reported on 06/21/2023), Disp: , Rfl:    losartan  (COZAAR ) 100 MG tablet, Take 100 mg by mouth at bedtime.  (Patient not taking: Reported on 11/25/2022), Disp: , Rfl:    magnesium  oxide (MAG-OX) 400 MG tablet, Take by mouth., Disp: , Rfl:    metaxalone (SKELAXIN) 800 MG tablet, Take by mouth., Disp: , Rfl:    metFORMIN (GLUCOPHAGE) 500 MG tablet, Take 500 mg by mouth., Disp: , Rfl:    metoprolol  succinate (TOPROL -XL) 25 MG 24 hr tablet, Take 25 mg by mouth 2 (two) times daily.  (Patient not taking: Reported on 11/25/2022), Disp: , Rfl:    Multiple Vitamin (MULTIVITAMIN WITH MINERALS) TABS tablet, Take 1 tablet by mouth daily., Disp: , Rfl:    omeprazole  (PRILOSEC) 40 MG capsule, TAKE 1 CAPSULE DAILY BEFORE BREAKFAST, Disp: 90 capsule, Rfl: 3   omeprazole  (PRILOSEC) 40 MG capsule, Take by mouth. (Patient not taking: Reported on 06/21/2023), Disp: , Rfl:    predniSONE (DELTASONE) 2.5 MG tablet, Take by mouth. (Patient not taking: Reported on 06/21/2023), Disp: , Rfl:    sulfamethoxazole-trimethoprim (BACTRIM) 400-80 MG tablet, Take 1 tablet by mouth 3 (three) times a week. (Patient not taking: Reported on 06/21/2023), Disp: , Rfl:    topiramate (TOPAMAX) 50 MG tablet, Take 50 mg by mouth 2 (two) times daily., Disp: , Rfl:    vitamin B-12 (CYANOCOBALAMIN) 500 MCG tablet, Take 500 mcg by mouth daily. , Disp: , Rfl:    vitamin C (ASCORBIC ACID) 500 MG tablet, Take 500 mg by mouth daily., Disp: , Rfl:    Vitamins/Minerals TABS, Take by mouth., Disp: , Rfl:  [2]  Social History Tobacco Use  Smoking Status Never  Smokeless Tobacco Never

## 2024-10-14 ENCOUNTER — Encounter

## 2024-10-18 ENCOUNTER — Encounter

## 2024-10-21 ENCOUNTER — Encounter

## 2024-10-24 ENCOUNTER — Encounter

## 2024-10-25 ENCOUNTER — Encounter

## 2024-10-28 ENCOUNTER — Encounter

## 2024-10-31 ENCOUNTER — Encounter

## 2024-11-01 ENCOUNTER — Encounter

## 2024-11-04 ENCOUNTER — Encounter

## 2024-11-07 ENCOUNTER — Encounter

## 2024-11-08 ENCOUNTER — Encounter

## 2024-11-11 ENCOUNTER — Encounter

## 2024-11-14 ENCOUNTER — Encounter

## 2024-11-15 ENCOUNTER — Encounter

## 2024-11-18 ENCOUNTER — Encounter

## 2024-11-21 ENCOUNTER — Encounter

## 2024-11-22 ENCOUNTER — Encounter

## 2024-11-25 ENCOUNTER — Encounter

## 2024-11-28 ENCOUNTER — Encounter

## 2024-11-29 ENCOUNTER — Encounter

## 2024-12-02 ENCOUNTER — Encounter

## 2024-12-05 ENCOUNTER — Encounter

## 2024-12-06 ENCOUNTER — Encounter

## 2024-12-09 ENCOUNTER — Encounter

## 2024-12-12 ENCOUNTER — Encounter

## 2024-12-13 ENCOUNTER — Encounter

## 2024-12-16 ENCOUNTER — Encounter

## 2024-12-19 ENCOUNTER — Encounter

## 2024-12-20 ENCOUNTER — Encounter

## 2024-12-23 ENCOUNTER — Encounter

## 2024-12-26 ENCOUNTER — Encounter

## 2024-12-27 ENCOUNTER — Encounter

## 2024-12-30 ENCOUNTER — Encounter

## 2025-01-02 ENCOUNTER — Encounter

## 2025-01-03 ENCOUNTER — Encounter

## 2025-01-06 ENCOUNTER — Encounter
